# Patient Record
Sex: Female | Born: 1955 | Hispanic: Yes | Marital: Married | Smoking: Never smoker
Health system: Southern US, Community
[De-identification: ages and names within clinical notes are randomized; demographics above are authoritative.]

## PROBLEM LIST (undated history)

## (undated) DIAGNOSIS — I10 Essential (primary) hypertension: Secondary | ICD-10-CM

## (undated) DIAGNOSIS — E119 Type 2 diabetes mellitus without complications: Secondary | ICD-10-CM

## (undated) DIAGNOSIS — K219 Gastro-esophageal reflux disease without esophagitis: Secondary | ICD-10-CM

## (undated) DIAGNOSIS — H269 Unspecified cataract: Secondary | ICD-10-CM

## (undated) HISTORY — PX: INCONTINENCE SURGERY: SHX676

## (undated) HISTORY — DX: Essential (primary) hypertension: I10

## (undated) HISTORY — DX: Gastro-esophageal reflux disease without esophagitis: K21.9

## (undated) HISTORY — DX: Unspecified cataract: H26.9

## (undated) HISTORY — PX: CHOLECYSTECTOMY: SHX55

## (undated) HISTORY — DX: Type 2 diabetes mellitus without complications: E11.9

## (undated) HISTORY — PX: ABDOMINAL HYSTERECTOMY: SHX81

---

## 2015-06-15 HISTORY — PX: REFRACTIVE SURGERY: SHX103

## 2019-01-26 ENCOUNTER — Ambulatory Visit (HOSPITAL_BASED_OUTPATIENT_CLINIC_OR_DEPARTMENT_OTHER): Payer: Self-pay | Admitting: Licensed Clinical Social Worker

## 2019-01-26 ENCOUNTER — Other Ambulatory Visit: Payer: Self-pay

## 2019-01-26 ENCOUNTER — Encounter: Payer: Self-pay | Admitting: Internal Medicine

## 2019-01-26 ENCOUNTER — Ambulatory Visit: Payer: Self-pay | Attending: Internal Medicine | Admitting: Internal Medicine

## 2019-01-26 VITALS — BP 140/69 | HR 84 | Temp 98.3°F | Resp 16 | Wt 198.4 lb

## 2019-01-26 DIAGNOSIS — F4321 Adjustment disorder with depressed mood: Secondary | ICD-10-CM

## 2019-01-26 DIAGNOSIS — Z1331 Encounter for screening for depression: Secondary | ICD-10-CM | POA: Insufficient documentation

## 2019-01-26 DIAGNOSIS — K5903 Drug induced constipation: Secondary | ICD-10-CM | POA: Insufficient documentation

## 2019-01-26 DIAGNOSIS — R413 Other amnesia: Secondary | ICD-10-CM | POA: Insufficient documentation

## 2019-01-26 DIAGNOSIS — E1142 Type 2 diabetes mellitus with diabetic polyneuropathy: Secondary | ICD-10-CM | POA: Insufficient documentation

## 2019-01-26 DIAGNOSIS — E11319 Type 2 diabetes mellitus with unspecified diabetic retinopathy without macular edema: Secondary | ICD-10-CM

## 2019-01-26 DIAGNOSIS — D649 Anemia, unspecified: Secondary | ICD-10-CM | POA: Insufficient documentation

## 2019-01-26 DIAGNOSIS — I10 Essential (primary) hypertension: Secondary | ICD-10-CM

## 2019-01-26 DIAGNOSIS — F331 Major depressive disorder, recurrent, moderate: Secondary | ICD-10-CM

## 2019-01-26 LAB — POCT GLYCOSYLATED HEMOGLOBIN (HGB A1C): HbA1c, POC (controlled diabetic range): 6.8 % (ref 0.0–7.0)

## 2019-01-26 LAB — GLUCOSE, POCT (MANUAL RESULT ENTRY): POC Glucose: 153 mg/dl — AB (ref 70–99)

## 2019-01-26 MED ORDER — INSULIN NPH ISOPHANE & REGULAR (70-30) 100 UNIT/ML ~~LOC~~ SUSP: SUBCUTANEOUS | 6 refills | Status: DC

## 2019-01-26 MED ORDER — GABAPENTIN 400 MG PO CAPS: 400.0000 mg | ORAL_CAPSULE | Freq: Two times a day (BID) | ORAL | 5 refills | Status: DC

## 2019-01-26 MED ORDER — LOSARTAN POTASSIUM 50 MG PO TABS: 50.0000 mg | ORAL_TABLET | Freq: Every day | ORAL | 1 refills | Status: DC

## 2019-01-26 MED ORDER — DOCUSATE SODIUM 100 MG PO CAPS: 100.0000 mg | ORAL_CAPSULE | Freq: Two times a day (BID) | ORAL | 3 refills | Status: DC

## 2019-01-26 NOTE — BH Specialist Note (Signed)
Integrated Behavioral Health Initial Visit  MRN: [id number] Name: Chelsea Coleman  Number of [place] Clinician visits:: 1/6 Session Start time: 9:45 AM  Session End time: 10:05 AM Total time: 20 minutes  Type of Service: [place] Interpretor:Yes.   Interpretor Name and Language: Mardi Mainland [phone])   Warm Hand Off Completed.       SUBJECTIVE: Chelsea Coleman is a 63 y.o. female accompanied by self Patient was referred by Dr. Wynetta Emery for positive depression and anxiety screens. Patient reports the following symptoms/concerns: Pt reports difficulty managing mental health. She is grieving the loss of an adult son from ten years ago Duration of problem: 10 years; Severity of problem: severe  OBJECTIVE: Mood: Anxious and Pleasant and Affect: Appropriate Risk of harm to self or others: No plan to harm self or others Pt scored positive on phq9; however, denies active SI/HI  LIFE CONTEXT: Family and Social: Pt receives support from family who resides locally and partner School/Work: Pt is insured. Has questions regarding social security benefits Self-Care: Pt enjoys spending time with family and watching over the young children in the home Life Changes: Pt reports difficulty managing mental health. She is grieving the loss of an adult son from ten years ago  GOALS ADDRESSED: Patient will: 1. Reduce symptoms of: anxiety and depression 2. Increase knowledge and/or ability of: coping skills and healthy habits  3. Demonstrate ability to: Increase healthy adjustment to current life circumstances  INTERVENTIONS: Interventions utilized: Supportive Counseling and Psychoeducation and/or Health Education  Standardized Assessments completed: GAD-7 and PHQ 2&9 with C-SSRS  ASSESSMENT: Patient currently experiencing depression and anxiety triggered by grief. She reports son was killed in [place] approx ten years ago. Pt scored positive on  phq9; however, denies active SI/HI. Per chart review, pt shared to PCP that she accidentally Coleman SI nearly everyday.    Patient may benefit from psychotherapy. She is not interested in medication management. LCSW discussed stages of grief and provided strategies to promote positive feelings. Pt was appreciative and was successful in identifying healthy coping skills.   PLAN: 1. Follow up with behavioral health clinician on : Schedule follow up appointment if needed 2. Behavioral recommendations: LCSW recommends that pt utilize healthy coping skills discussed 3. Referral(s): [place] (In Clinic) and [employer] Resources:  Legal Aid 4. "From scale of 1-10, how likely are you to follow plan?": 10  Rebekah Chesterfield, LCSW 02/05/2019 4:23 PM

## 2019-01-26 NOTE — Progress Notes (Signed)
Patient ID: Chelsea Coleman, female    DOB: 1955-12-31  MRN: [id number]  CC: New Patient (Initial Visit), Diabetes, and Hypertension   Subjective: Chelsea Coleman is a 63 y.o. female who presents for new pt visit and chronic ds management Her concerns today include:   No previous PCP.  Came from [place] 8 mths ago.  Only she and her spouse are here.  She has daughter in [place].   Pt with hx of DM neuropathy and retinopathy, HTN, obesity, anemia  DIABETES TYPE 2 Last A1C:   BS 153 and A1C 6.8  Med Adherence:  [x]  Yes -she is on Humulin 70/30 20 units in the mornings with breakfast and 10 units in the evenings with dinner.  States that she was on metformin in the past and did not tolerate it.   Medication side effects:  []  Yes    [x]  No Home Monitoring?  [x]  Yes once a wk    []  No Home glucose results range: 115-120 Diet Adherence: [x]  Yes    []  No Exercise: []  Yes    [x]  No - "because I can barely see.  I'm afraid of falling down."  She ambulates with a cane Hypoglycemic episodes?: []  Yes    [x]  No Numbness of the feet? [x]  Yes    []  No Retinopathy hx? [x]  Yes - reports that she was seeing ophthalmologist regularly.  Had laser surgery BL about 3 yrs ago.  Reports the surgery helped at first but vision worse over past yr.  Has Medicare part A, plans to get part B next yr when she is 46 Last eye exam: 3 years ago Comments:   C/O constipation.  "It's hard for me to go." Taking Miralax and fiber which helps. Never had c-scope.  No blood in stools.    HYPERTENSION Currently taking: see medication list - Cozaar 50 mg.  She tells me she is also on a fluid pill but does not recall the name and forgot to bring the bottle with her Med Adherence: [x]  Yes    []  No Medication side effects: []  Yes    [x]  No Adherence with salt restriction: [x]  Yes    []  No Home Monitoring?: []  Yes    [x]  No Monitoring Frequency: []  Yes    []  No Home BP results range: []  Yes    []  No SOB? []  Yes    [x]  No Chest  Pain?: []  Yes    [x]  No Leg swelling?: []  Yes    [x]  No Headaches?: []  Yes    [x]  No Dizziness? []  Yes    [x]  No Comments:   Pos Dep/Anx screen: Both screens were significantly positive.  However, she reports no major issues at this time.  Pt checked yes on screen form for SI.  However, pt states she miss read the form.  She reports significant visual impairment  Some problems with memory. Use to be able to speak english really well but not as much any more.  She learn english when she was young and took her [place] citizen exam exam in [place].  However she has lived in [place] for quite some time and spoke only [place] while there.  Now that she is back in the [place] she is having a difficult time trying to remember some words in [place].  She says she now listens to the kids and pick up from them -Denies any safety issues like forgetting to turn the water or stove off.  She does sometimes forgets where she has placed an object.  Hx of Anemia:  Reports she was getting inj 6 yrs ago about every week.  She does not recall the name of the injection Currently on Iron one daily.    HM: Reports having had Tdap and Pneumonia Vaccines in her country  Family history, social history, surgical history reviewed and updated.    No current outpatient medications on file prior to visit.   No current facility-administered medications on file prior to visit.     No Known Allergies  Social History   Socioeconomic History  . Marital status: Married    Spouse name: Not on file  . Number of children: 1  . Years of education: 12 grade  . Highest education level: Not on file  Occupational History  . Occupation: unemployed  Social Needs  . Financial resource strain: Not on file  . Food insecurity    Worry: Not on file    Inability: Not on file  . Transportation needs    Medical: Not on file    Non-medical: Not on file  Tobacco Use  . Smoking status: Never Smoker  . Smokeless tobacco: Never Used   Substance and Sexual Activity  . Alcohol use: Never    Frequency: Never  . Drug use: Never  . Sexual activity: Not on file  Lifestyle  . Physical activity    Days per week: Not on file    Minutes per session: Not on file  . Stress: Not on file  Relationships  . Social Herbalist on phone: Not on file    Gets together: Not on file    Attends religious service: Not on file    Active member of club or organization: Not on file    Attends meetings of clubs or organizations: Not on file    Relationship status: Not on file  . Intimate partner violence    Fear of current or ex partner: Not on file    Emotionally abused: Not on file    Physically abused: Not on file    Forced sexual activity: Not on file  Other Topics Concern  . Not on file  Social History Narrative  . Not on file    Family History  Problem Relation Age of Onset  . Diabetes Mother     Past Surgical History:  Procedure Laterality Date  . REFRACTIVE SURGERY  2017   BL    ROS: Review of Systems Negative except as stated above  PHYSICAL EXAM: BP 140/69   Pulse 84   Temp 98.3 F (36.8 C) (Oral)   Resp 16   Wt 198 lb 6.4 oz (90 kg)   SpO2 99%   Physical Exam  General appearance - alert, well appearing, obese female and in no distress Mental status - normal mood, behavior, speech, dress, motor activity, and thought processes Eyes - pupils equal and reactive, extraocular eye movements intact Nose - normal and patent, no erythema, discharge or polyps Mouth - mucous membranes moist, pharynx normal without lesions Neck - supple, no significant adenopathy Chest - clear to auscultation, no wheezes, rales or rhonchi, symmetric air entry Heart - normal rate, regular rhythm, normal S1, S2, no murmurs, rubs, clicks or gallops Abdomen - soft, nontender, nondistended, no masses or organomegaly Extremities - peripheral pulses normal, no pedal edema, no clubbing or cyanosis Diabetic Foot Exam - Simple    Simple Foot Form Visual Inspection No deformities, no ulcerations, no  other skin breakdown bilaterally: Yes Sensation Testing Intact to touch and monofilament testing bilaterally: Yes Pulse Check Posterior Tibialis and Dorsalis pulse intact bilaterally: Yes Comments    A1C: 6.8  Depression screen PHQ 2/9 01/26/2019  Decreased Interest 3  Down, Depressed, Hopeless 3  PHQ - 2 Score 6  Altered sleeping 3  Tired, decreased energy 3  Change in appetite 3  Feeling bad or failure about yourself  3  Trouble concentrating 3  Moving slowly or fidgety/restless 3  Suicidal thoughts 3  PHQ-9 Score 27   GAD 7 : Generalized Anxiety Score 01/26/2019  Nervous, Anxious, on Edge 3  Control/stop worrying 3  Worry too much - different things 3  Trouble relaxing 3  Restless 3  Easily annoyed or irritable 3  Afraid - awful might happen 3  Total GAD 7 Score 21      ASSESSMENT AND PLAN: 1. Type 2 diabetes, controlled, with peripheral neuropathy (HCC) -Controlled.  She will continue current dose of Humulin 70/30. Commended her on healthy eating habits and encouraged her to continue doing so. She is not very active because of poor vision.  I told patient that there is no assistance plan to help with getting her to an ophthalmologist.  She may end up having to pay out of pocket.  She prefers to wait until she gets Medicaid part B later this year or next year - POCT glucose (manual entry) - POCT glycosylated hemoglobin (Hb A1C) - Microalbumin / creatinine urine ratio - CBC - Comprehensive metabolic panel - Lipid panel - gabapentin (NEURONTIN) 400 MG capsule; Take 1 capsule (400 mg total) by mouth 2 (two) times daily.  Dispense: 60 capsule; Refill: 5 - insulin NPH-regular Human (70-30) 100 UNIT/ML injection; Inj 20 units in a.m with breakfast and 10 units with dinner  Dispense: 10 mL; Refill: 6  2. Diabetic retinopathy associated with controlled type 2 diabetes mellitus ([place]) See #1 above  3.  Essential hypertension Not at goal but patient has not taken medicine as yet for today.  She tells me that she is also on a fluid pill.  I have asked that she bring that medication with her on the next visit - losartan (COZAAR) 50 MG tablet; Take 1 tablet (50 mg total) by mouth daily.  Dispense: 90 tablet; Refill: 1  4. Anemia, unspecified type It sounds as though she has history of iron deficiency anemia.  Will check iron studies. - Vitamin B12 - Folate - Iron, TIBC and Ferritin Panel  5. Drug-induced constipation Most likely due to the iron supplement that she is taking.  She will continue MiraLAX.  Add Colace.  Advised to eat a green leafy vegetable with at least 1 of her meals every day - docusate sodium (COLACE) 100 MG capsule; Take 1 capsule (100 mg total) by mouth 2 (two) times daily.  Dispense: 60 capsule; Refill: 3  6. Memory difficulties -Patient scored 21 on MMSE.  The only question that she struggled with was the one where she was asked to spell the word world backwards and to count backwards from 100 and blocks of 7.  I have reassured her in regards to her concerns of not remembering much English is not too concerning given the fact that after learning English she went back to [place] where she hardly ever use that language. - TSH - Vitamin B12 - Folate  7. Positive depression screening I think patient missed read the screening questions due to her poor vision.  She denies any issues with depression or anxiety at this time.  Over 30 minutes spent with this patient in direct face-to-face discussion of diagnosis and management and coordinating care. Patient was given the opportunity to ask questions.  Patient verbalized understanding of the plan and was able to repeat key elements of the plan.   Orders Placed This Encounter  Procedures  . Microalbumin / creatinine urine ratio  . CBC  . Comprehensive metabolic panel  . Lipid panel  . TSH  . Vitamin B12  . Folate  .  Iron, TIBC and Ferritin Panel  . POCT glucose (manual entry)  . POCT glycosylated hemoglobin (Hb A1C)     Requested Prescriptions   Signed Prescriptions Disp Refills  . docusate sodium (COLACE) 100 MG capsule 60 capsule 3    Sig: Take 1 capsule (100 mg total) by mouth 2 (two) times daily.  Marland Kitchen losartan (COZAAR) 50 MG tablet 90 tablet 1    Sig: Take 1 tablet (50 mg total) by mouth daily.  Marland Kitchen gabapentin (NEURONTIN) 400 MG capsule 60 capsule 5    Sig: Take 1 capsule (400 mg total) by mouth 2 (two) times daily.  . insulin NPH-regular Human (70-30) 100 UNIT/ML injection 10 mL 6    Sig: Inj 20 units in a.m with breakfast and 10 units with dinner    Return in about 2 months (around 03/28/2019).  Karle Plumber, MD, FACP

## 2019-01-26 NOTE — Patient Instructions (Signed)
La diabetes mellitus y las normas bsicas de atencin mdica Diabetes Mellitus and Standards of Medical Care Tratar la diabetes (diabetes mellitus) puede ser complicado. Su tratamiento de la diabetes puede ser administrado por un equipo de profesionales de la salud, que incluye:  Un mdico especializado en diabetes (endocrinlogo).  Un enfermero especializado o auxiliar mdico.  Enfermeros.  Un especialista en dietas y nutricin (nutricionista registrado).  Un educador certificado para el cuidado de la diabetes.  Un especialista en actividad fsica.  Un farmacutico.  Eliot Ford.  Un especialista en pies (podlogo).  Un dentista.  Un mdico de cabecera.  Un profesional de salud mental. Los mdicos siguen pautas para ayudarlo a Therapist, music la mejor calidad de atencin. El programa siguiente es una gua general para su plan de control de la diabetes. Los mdicos tambin podrn darle instrucciones ms especficas. Exmenes fsicos Tras ser diagnosticado con diabetes mellitus, y cada ao luego de esto, su mdico le preguntar acerca de sus antecedentes mdicos y familiares. Tambin har un examen fsico. Este examen puede incluir:  Medicin de la estatura, peso e ndice de masa corporal [facility]).  Control de la presin arterial. Esto se realiza en cada visita mdica de rutina. La presin arterial deseada puede variar en funcin de sus enfermedades, edad y otros factores.  Examen de la glndula tiroidea.  Examen de la piel.  Examen para deteccin de dao en los nervios (neuropata perifrica). Esto puede incluir controlar el pulso de las piernas y los pies, y el nivel de sensibilidad en las manos y pies.  Un examen de pies completo para inspeccionar la estructura y la piel de los pies, lo que incluye controlar si hay cortes, hematomas, enrojecimiento, ampollas, llagas u otros problemas.  Examen de deteccin de problemas con los vasos sanguneos (vasculares), lo que puede incluir  controlar el pulso de las piernas y los pies y Therapist, sports. Anlisis de [place]. Segn el plan de tratamiento y [place] necesidades personales, es posible que se le realicen las siguientes pruebas:  Anlisis de HbA1c (hemoglobina A1c). Este anlisis proporciona informacin sobre el control de la glucemia (glucosa en la sangre) durante los ltimos 2 o 72mses. Se uCanadapara ajustar el plan de tratamiento, de ser necesario. Este anlisis se har: ? Al menos 2 veces al ao si cumple los objetivos del tratamiento. ? [employer] Corporation si no cumple los objetivos del tratamiento o si sus objetivos del [place]  Anlisis de lpidos, lo que incluye colesterol LDL y HDL y niveles de triglicridos. ? En relacin al LDL, el objetivo es tener menos de 10105mdl (5,5 mmol/l). Si tiene alto riesgo de complicaciones, el objetivo es tener menos de 70 mg/dl (3.9 mmol/l). ? En relacin al HDL, el objetivo es tener 40 mg/dl (2.2 mmol/l) o ms para los hombres y 50 mg/dl (2.8 mmol/l) o ms para las mujeres. Un nivel de colesterol HDL de 60 mg/dl (3.12m74m/l) o superior da una cierta proteccin contra la enfermedad cardaca. ? En relacin a los triglicridos, el objetivo es tener menos de 150 mg/dl (8,3 mmol/l).  Pruebas funcionales hepticas.  Pruebas de la funcin renal.  Pruebas de la funcin tiroidea. Exmenes dentales y ocuThe ServiceMaster Companyisite al denExxon Mobil Corporationces por ao.  Si tiene diabetes tipo1, el mdico puede recomendarle que se haga un examen ocular 3 a 5aos despus del diagnstico y, luego, unaArdelia Memsz al ao despus del priTree surgeon Para los nios que tienen diabetes tipo1, el pedEcologist  examen ocular cuando el nio tiene 10aos o ms y ha tenido diabetes durante 3 a 5 aos. Despus del primer examen, el nio debe hacerse un examen ocular una vez al ao.  Si tiene diabetes tipo2, el mdico puede recomendarle que se haga un examen ocular ni bien haya sido  diagnosticado y, luego, Dollene Cleveland al ao despus del Tree surgeon. Vacunas   Se recomienda aplicar de forma anual la vacuna contra la gripe (influenza) a todas las personas de 27meses en adelante que tengan diabetes.  La vacuna contra la neumona (antineumoccica) est recomendada para todas las personas de 2aos en adelante que tengan diabetes. Si tiene 65aos o ms, puede recibir Engineer, manufacturing como una serie de dos inyecciones [place].  Se recomienda administrar la vacuna contra la hepatitisB en adultos poco despus de que hayan recibido el diagnstico de diabetes.  Los adultos y los nios que tienen diabetes deben recibir todas las vacunas de acuerdo con las recomendaciones especficas segn la edad de los Centros para el Control y la Prevencin de Probation officer for Disease Control and Prevention, CDC). Salud mental y emocional Se recomienda Optometrist controles para Hydrographic surveyor sntomas de trastornos de Youth worker, ansiedad y depresin en el momento del diagnstico, y en una etapa posterior segn sea necesario. Si los controles revelan la presencia de sntomas (resultado positivo de los controles), es posible que deba someterse a ms evaluaciones y Fish farm manager con un profesional de salud mental. Plan de tratamiento Su plan de tratamiento ser revisado en cada visita mdica. Usted y Nature conservation officer lo siguiente:  Cmo est recibiendo los medicamentos, incluso la [place].  Cualquier efectos secundarios que est experimentando.  Sus objetivos deseados con relacin a la glucemia.  La frecuencia de su control de glucemia.  Hbitos del estilo de vida, como nivel de [place] as como consumo de tabaco, alcohol y Engineer, materials. Educacin para el autocontrol de la diabetes El mdico evaluar qu tan bien se encuentran los niveles de glucemia y si est recibiendo la cantidad correcta de [place]. El mdico puede derivarlo a:  Teaching laboratory technician en educacin para la diabetes  certificado para Air cabin crew la diabetes durante toda la vida, comenzando desde el diagnstico.  Un nutricionista matriculado que puede crear o revisar un plan de nutricin personal.  Un especialista en ejercicios que puede analizar el nivel de actividad y un plan de ejercicios. Resumen  Tratar la diabetes (diabetes mellitus) puede ser complicado. Su tratamiento de la diabetes puede ser administrado por un equipo de profesionales de Technical sales engineer.  Los mdicos siguen una gua para ayudarlo a Personal assistant mejor calidad de atencin.  Las normas asistenciales incluyen realizarse, regularmente, exmenes fsicos, anlisis de sangre y controles de la presin arterial, aplicarse vacunas, someterse a estudios de control e informarse sobre cmo controlar su diabetes.  Sus mdicos tambin podrn darle instrucciones ms especficas basndose en su salud. Esta informacin no tiene Marine scientist el consejo del mdico. Asegrese de hacerle al mdico cualquier pregunta que tenga. Document Released: 08/25/2009 Document Revised: 03/28/2017 Document Reviewed: 10/31/2012 Elsevier Patient Education  2020 [employer].

## 2019-01-26 NOTE — Progress Notes (Signed)
[id number] a1c-6.8  Pt states she is needing something for memory

## 2019-01-27 ENCOUNTER — Other Ambulatory Visit: Payer: Self-pay | Admitting: Internal Medicine

## 2019-01-27 DIAGNOSIS — E1169 Type 2 diabetes mellitus with other specified complication: Secondary | ICD-10-CM

## 2019-01-27 LAB — LIPID PANEL
Chol/HDL Ratio: 3.7 ratio (ref 0.0–4.4)
Cholesterol, Total: 211 mg/dL — ABNORMAL HIGH (ref 100–199)
HDL: 57 mg/dL (ref >39)
LDL Calculated: 122 mg/dL — ABNORMAL HIGH (ref 0–99)
Triglycerides: 162 mg/dL — ABNORMAL HIGH (ref 0–149)
VLDL Cholesterol Cal: 32 mg/dL (ref 5–40)

## 2019-01-27 LAB — COMPREHENSIVE METABOLIC PANEL
ALT: 9 IU/L (ref 0–32)
AST: 13 IU/L (ref 0–40)
Albumin/Globulin Ratio: 1.5 (ref 1.2–2.2)
Albumin: 4.1 g/dL (ref 3.8–4.8)
Alkaline Phosphatase: 101 IU/L (ref 39–117)
BUN/Creatinine Ratio: 16 (ref 12–28)
BUN: 20 mg/dL (ref 8–27)
Bilirubin Total: 0.2 mg/dL (ref 0.0–1.2)
CO2: 21 mmol/L (ref 20–29)
Calcium: 9.4 mg/dL (ref 8.7–10.3)
Chloride: 95 mmol/L — ABNORMAL LOW (ref 96–106)
Creatinine, Ser: 1.29 mg/dL — ABNORMAL HIGH (ref 0.57–1.00)
GFR calc Af Amer: 51 mL/min/{1.73_m2} — ABNORMAL LOW (ref >59)
GFR calc non Af Amer: 44 mL/min/{1.73_m2} — ABNORMAL LOW (ref >59)
Globulin, Total: 2.8 g/dL (ref 1.5–4.5)
Glucose: 158 mg/dL — ABNORMAL HIGH (ref 65–99)
Potassium: 5.2 mmol/L (ref 3.5–5.2)
Sodium: 130 mmol/L — ABNORMAL LOW (ref 134–144)
Total Protein: 6.9 g/dL (ref 6.0–8.5)

## 2019-01-27 LAB — CBC
Hematocrit: 31.1 % — ABNORMAL LOW (ref 34.0–46.6)
Hemoglobin: 10.4 g/dL — ABNORMAL LOW (ref 11.1–15.9)
MCH: 30.1 pg (ref 26.6–33.0)
MCHC: 33.4 g/dL (ref 31.5–35.7)
MCV: 90 fL (ref 79–97)
Platelets: 553 10*3/uL — ABNORMAL HIGH (ref 150–450)
RBC: 3.45 x10E6/uL — ABNORMAL LOW (ref 3.77–5.28)
RDW: 13.2 % (ref 11.7–15.4)
WBC: 10 10*3/uL (ref 3.4–10.8)

## 2019-01-27 LAB — TSH: TSH: 1.49 u[IU]/mL (ref 0.450–4.500)

## 2019-01-27 LAB — IRON,TIBC AND FERRITIN PANEL
Ferritin: 573 ng/mL — ABNORMAL HIGH (ref 15–150)
Iron Saturation: 22 % (ref 15–55)
Iron: 50 ug/dL (ref 27–139)
Total Iron Binding Capacity: 228 ug/dL — ABNORMAL LOW (ref 250–450)
UIBC: 178 ug/dL (ref 118–369)

## 2019-01-27 LAB — FOLATE: Folate: 11 ng/mL (ref >3.0)

## 2019-01-27 LAB — MICROALBUMIN / CREATININE URINE RATIO
Creatinine, Urine: 98.4 mg/dL
Microalb/Creat Ratio: 12 mg/g creat (ref 0–29)
Microalbumin, Urine: 11.9 ug/mL

## 2019-01-27 LAB — VITAMIN B12: Vitamin B-12: 614 pg/mL (ref 232–1245)

## 2019-01-27 MED ORDER — ATORVASTATIN CALCIUM 20 MG PO TABS: 20.0000 mg | ORAL_TABLET | Freq: Every day | ORAL | 3 refills | Status: DC

## 2019-01-30 ENCOUNTER — Telehealth: Payer: Self-pay

## 2019-01-30 NOTE — Telephone Encounter (Signed)
[place] interpreters christian  Id# [phone]  contacted pt to go over lab results pt didn't answer was unable to lvm due to vm being full

## 2019-02-09 ENCOUNTER — Telehealth: Payer: Self-pay | Admitting: Licensed Clinical Social Worker

## 2019-02-09 NOTE — Telephone Encounter (Signed)
Completed Legal Aid referral faxed to Abelino Derrick at [phone]

## 2019-03-12 ENCOUNTER — Telehealth: Payer: Self-pay | Admitting: Licensed Clinical Social Worker

## 2019-03-12 NOTE — Telephone Encounter (Signed)
Late Entry 02/28/2019  LCSW followed up with Legal Aid for update on referral. Legal Aid shared they will give pt a call to complete intake.

## 2019-04-06 ENCOUNTER — Encounter: Payer: Self-pay | Admitting: Internal Medicine

## 2019-04-06 ENCOUNTER — Ambulatory Visit (HOSPITAL_BASED_OUTPATIENT_CLINIC_OR_DEPARTMENT_OTHER): Payer: Self-pay | Admitting: Pharmacist

## 2019-04-06 ENCOUNTER — Other Ambulatory Visit: Payer: Self-pay

## 2019-04-06 ENCOUNTER — Ambulatory Visit: Payer: Self-pay | Attending: Internal Medicine | Admitting: Internal Medicine

## 2019-04-06 VITALS — BP 141/80 | HR 75 | Temp 98.2°F | Resp 16 | Wt 202.2 lb

## 2019-04-06 DIAGNOSIS — Z1231 Encounter for screening mammogram for malignant neoplasm of breast: Secondary | ICD-10-CM

## 2019-04-06 DIAGNOSIS — I10 Essential (primary) hypertension: Secondary | ICD-10-CM

## 2019-04-06 DIAGNOSIS — Z23 Encounter for immunization: Secondary | ICD-10-CM

## 2019-04-06 DIAGNOSIS — N1832 Chronic kidney disease, stage 3b: Secondary | ICD-10-CM | POA: Insufficient documentation

## 2019-04-06 DIAGNOSIS — E1169 Type 2 diabetes mellitus with other specified complication: Secondary | ICD-10-CM | POA: Insufficient documentation

## 2019-04-06 DIAGNOSIS — E1142 Type 2 diabetes mellitus with diabetic polyneuropathy: Secondary | ICD-10-CM

## 2019-04-06 DIAGNOSIS — E785 Hyperlipidemia, unspecified: Secondary | ICD-10-CM | POA: Insufficient documentation

## 2019-04-06 DIAGNOSIS — K219 Gastro-esophageal reflux disease without esophagitis: Secondary | ICD-10-CM | POA: Insufficient documentation

## 2019-04-06 LAB — GLUCOSE, POCT (MANUAL RESULT ENTRY): POC Glucose: 120 mg/dl — AB (ref 70–99)

## 2019-04-06 MED ORDER — OMEPRAZOLE 20 MG PO CPDR: 20.0000 mg | DELAYED_RELEASE_CAPSULE | Freq: Every day | ORAL | 3 refills | Status: DC

## 2019-04-06 MED ORDER — ATORVASTATIN CALCIUM 20 MG PO TABS: 20.0000 mg | ORAL_TABLET | Freq: Every day | ORAL | 3 refills | Status: DC

## 2019-04-06 MED ORDER — FUROSEMIDE 20 MG PO TABS: 20.0000 mg | ORAL_TABLET | Freq: Every day | ORAL | 3 refills | Status: DC

## 2019-04-06 NOTE — Progress Notes (Signed)
Patient ID: Chelsea Coleman, female    DOB: 1955-11-04  MRN: [id number]  CC: Diabetes and Hypertension   Subjective: Chelsea Coleman is a 63 y.o. female who presents for 2 mth f/u chronic ds management Her concerns today include:  Pt with hx of DM neuropathy and retinopathy, HTN, obesity, anemia  I went over lab from visit 01/2019.  She has a mild anemia that looked like ACD. Plan to refer her for c-scope once she has Medicare next yr.  Kidney function revealed dec GFR of 44.  Was told in past by previous PCP that she had kidney dysfunction. Na+ was mildly low.  On last visit she told me that she was on a fluid pill but could not recall the name.  She brings in bottles with her today showing that she is on furosemide 20 mg daily and also on omeprazole 40 mg Her LDL cholesterol was not at goal.  I recommend starting atorvastatin.  DM: Reports that she is doing well.  Compliant with taking insulin.  A1c on last visit was 6.8.  She does well with her eating habits.  Checks blood sugars once a week.  Reports that she has never gotten above 150.  No low blood sugar episodes.  She is awaiting Medicare next year to get established with an ophthalmologist  HTN: Reports compliance with Cozaar.  Of note she brings furosemide bottle with her today.  She has been on this from her previous PCP and was still taking.  She limits salt in the foods.  No chest pains or shortness of breath.  No lower extremity edema.  Patient Active Problem List   Diagnosis Date Noted  . Type 2 diabetes, controlled, with peripheral neuropathy ([place]) 01/26/2019  . Diabetic retinopathy associated with controlled type 2 diabetes mellitus ([place]) 01/26/2019  . Essential hypertension 01/26/2019  . Anemia 01/26/2019  . Drug-induced constipation 01/26/2019  . Memory difficulties 01/26/2019  . Positive depression screening 01/26/2019     Current Outpatient Medications on File Prior to Visit  Medication Sig Dispense Refill  .  docusate sodium (COLACE) 100 MG capsule Take 1 capsule (100 mg total) by mouth 2 (two) times daily. 60 capsule 3  . gabapentin (NEURONTIN) 400 MG capsule Take 1 capsule (400 mg total) by mouth 2 (two) times daily. 60 capsule 5  . insulin NPH-regular Human (70-30) 100 UNIT/ML injection Inj 20 units in a.m with breakfast and 10 units with dinner 10 mL 6  . losartan (COZAAR) 50 MG tablet Take 1 tablet (50 mg total) by mouth daily. 90 tablet 1   No current facility-administered medications on file prior to visit.     No Known Allergies  Social History   Socioeconomic History  . Marital status: Married    Spouse name: Not on file  . Number of children: 1  . Years of education: 12 grade  . Highest education level: Not on file  Occupational History  . Occupation: unemployed  Social Needs  . Financial resource strain: Not on file  . Food insecurity    Worry: Not on file    Inability: Not on file  . Transportation needs    Medical: Not on file    Non-medical: Not on file  Tobacco Use  . Smoking status: Never Smoker  . Smokeless tobacco: Never Used  Substance and Sexual Activity  . Alcohol use: Never    Frequency: Never  . Drug use: Never  . Sexual activity: Not on file  Lifestyle  .  Physical activity    Days per week: Not on file    Minutes per session: Not on file  . Stress: Not on file  Relationships  . Social Herbalist on phone: Not on file    Gets together: Not on file    Attends religious service: Not on file    Active member of club or organization: Not on file    Attends meetings of clubs or organizations: Not on file    Relationship status: Not on file  . Intimate partner violence    Fear of current or ex partner: Not on file    Emotionally abused: Not on file    Physically abused: Not on file    Forced sexual activity: Not on file  Other Topics Concern  . Not on file  Social History Narrative  . Not on file    Family History  Problem Relation  Age of Onset  . Diabetes Mother     Past Surgical History:  Procedure Laterality Date  . REFRACTIVE SURGERY  2017   BL    ROS: Review of Systems Negative except as stated above  PHYSICAL EXAM: BP (!) 141/80   Pulse 75   Temp 98.2 F (36.8 C) (Oral)   Resp 16   Wt 202 lb 3.2 oz (91.7 kg)   SpO2 98%   Physical Exam General appearance - alert, well appearing, obese female and in no distress Mental status - normal mood, behavior, speech, dress, motor activity, and thought processes Eyes - pupils equal and reactive, extraocular eye movements intact Nose - normal and patent, no erythema, discharge or polyps Mouth - mucous membranes moist, pharynx normal without lesions Neck - supple, no significant adenopathy Chest - clear to auscultation, no wheezes, rales or rhonchi, symmetric air entry Heart - normal rate, regular rhythm, normal S1, S2, no murmurs, rubs, clicks or gallops Extremities: No lower extremity edema   CMP Latest Ref Rng & Units 01/26/2019  Glucose 65 - 99 mg/dL 158(H)  BUN 8 - 27 mg/dL 20  Creatinine 0.57 - 1.00 mg/dL 1.29(H)  Sodium 134 - 144 mmol/L 130(L)  Potassium 3.5 - 5.2 mmol/L 5.2  Chloride 96 - 106 mmol/L 95(L)  CO2 20 - 29 mmol/L 21  Calcium 8.7 - 10.3 mg/dL 9.4  Total Protein 6.0 - 8.5 g/dL 6.9  Total Bilirubin 0.0 - 1.2 mg/dL 0.2  Alkaline Phos 39 - 117 IU/L 101  AST 0 - 40 IU/L 13  ALT 0 - 32 IU/L 9   Lipid Panel     Component Value Date/Time   CHOL 211 (H) 01/26/2019 1154   TRIG 162 (H) 01/26/2019 1154   HDL 57 01/26/2019 1154   CHOLHDL 3.7 01/26/2019 1154   LDLCALC 122 (H) 01/26/2019 1154    CBC    Component Value Date/Time   WBC 10.0 01/26/2019 1154   RBC 3.45 (L) 01/26/2019 1154   HGB 10.4 (L) 01/26/2019 1154   HCT 31.1 (L) 01/26/2019 1154   PLT 553 (H) 01/26/2019 1154   MCV 90 01/26/2019 1154   MCH 30.1 01/26/2019 1154   MCHC 33.4 01/26/2019 1154   RDW 13.2 01/26/2019 1154    ASSESSMENT AND PLAN: 1. Type 2 diabetes,  controlled, with peripheral neuropathy (HCC) At goal.  Continue current dose of Humulin 70/30 insulin. Continue healthy eating habits - POCT glucose (manual entry)  2. Essential hypertension Not at goal but patient has not taken medicines as yet for the morning.  Continue  Cozaar and furosemide - furosemide (LASIX) 20 MG tablet; Take 1 tablet (20 mg total) by mouth daily.  Dispense: 30 tablet; Refill: 3  3. Gastroesophageal reflux disease without esophagitis Given kidney function is not 100%, I recommend decreasing omeprazole to 20 mg daily - omeprazole (PRILOSEC) 20 MG capsule; Take 1 capsule (20 mg total) by mouth daily.  Dispense: 30 capsule; Refill: 3  4. Need for Tdap vaccination Given  5. Need for influenza vaccination Given  6. Encounter for screening mammogram for malignant neoplasm of breast - MM Digital Screening; Future  7. Hyperlipidemia associated with type 2 diabetes mellitus (HCC) - atorvastatin (LIPITOR) 20 MG tablet; Take 1 tablet (20 mg total) by mouth daily.  Dispense: 90 tablet; Refill: 3  8.  CKD stage IIIb Advised to avoid taking NSAIDs.  Decrease dose of omeprazole from 40 mg daily to 20 mg daily.  Encouraged her really to take it only when needed as it can adversely affect kidney function when taken long-term.   Patient was given the opportunity to ask questions.  Patient verbalized understanding of the plan and was able to repeat key elements of the plan.   Orders Placed This Encounter  Procedures  . MM Digital Screening  . POCT glucose (manual entry)     Requested Prescriptions   Signed Prescriptions Disp Refills  . omeprazole (PRILOSEC) 20 MG capsule 30 capsule 3    Sig: Take 1 capsule (20 mg total) by mouth daily.  . furosemide (LASIX) 20 MG tablet 30 tablet 3    Sig: Take 1 tablet (20 mg total) by mouth daily.  Marland Kitchen atorvastatin (LIPITOR) 20 MG tablet 90 tablet 3    Sig: Take 1 tablet (20 mg total) by mouth daily.    Return in about 4 weeks  (around 05/04/2019) for PAP.  Karle Plumber, MD, FACP

## 2019-04-06 NOTE — Patient Instructions (Signed)
Vacuna Td (contra el ttanos y la difteria): lo que debe saber Td Vaccine (Tetanus and Diphtheria): What You Need to Know 1. Por qu vacunarse? El ttanos y la difteria son enfermedades muy graves. Son Futures trader frecuentes en los [place] actualmente, pero las personas que se infectan suelen tener complicaciones graves. La vacuna Td se [place] para proteger a los adolescentes y a los adultos de ambas enfermedades. Tanto el ttanos como la difteria son infecciones causadas por bacterias. La difteria se transmite de persona a persona a travs de la tos o el estornudo. Las bacterias que causan el ttanos entran en el cuerpo a travs de cortes, raspones o heridas. El TTANOS (trismo) provoca entumecimiento y Writer dolorosa de los msculos, por lo general, en todo el cuerpo.  Puede causar el endurecimiento de los msculos de la cabeza y el cuello, de modo que impide abrir la boca, tragar y, en algunos casos, incluso respirar. El ttanos es causa de muerte en aproximadamente 1de cada 10personas que contraen la infeccin, incluso despus de que reciben la mejor atencin mdica. La DIFTERIA puede hacer que se forme una membrana gruesa en la parte posterior de la garganta.  Puede causar problemas respiratorios, parlisis, insuficiencia cardaca e incluso la muerte. Antes de las vacunas, en los [place] se informaban 200000 casos de difteria y cientos de casos de ttanos cada ao. Desde que comenz la vacunacin, los informes de casos de ambas enfermedades se han reducido en un 99%. 2. Edward Jolly Td La vacuna Td puede proteger a adolescentes y adultos contra el ttanos y la difteria. La vacuna Td habitualmente se aplica como dosis de refuerzo cada 10aos, pero tambin puede administrarse antes si la persona sufre una [place] o herida sucia y grave. A veces, en lugar de la vacuna Td, se recomienda una vacuna llamada Tdap, que protege contra la tos [place], adems de proteger contra el ttanos y la  difteria. El mdico o la persona que le aplique la vacuna puede darle ms informacin al [employer]. La vacuna Td puede administrarse de manera segura simultneamente con otras vacunas. 3. Algunas personas no deben recibir esta vacuna  Una persona que alguna vez ha tenido una reaccin alrgica potencialmente mortal a una dosis anterior de cualquier vacuna contra el ttanos o la difteria, O que tenga una alergia grave a cualquier parte de esta vacuna, no debe recibir la vacuna Td. Informe a la persona que le aplica la vacuna si tiene cualquier alergia grave.  Consulte con su mdico si: ? tuvo hinchazn o dolor intenso despus de recibir cualquier vacuna contra la difteria o el ttanos, ? alguna vez ha sufrido el sndrome de Curator (SGB), ? no se siente Pharmacologist en que se ha programado la vacuna. 4. [place] de [place] reaccin a la vacuna Con cualquier medicamento, incluso las vacunas, existe la posibilidad de que aparezcan efectos secundarios. Suelen ser leves y desaparecen por s solos. Si bien es posible tener [place] graves, estas son Orlene Erm raras. [place] personas a las que se les aplica la vacuna Td no tienen ningn problema. Problemas leves despus de la vacuna Td: (No interfirieron en las actividades)  Management consultant donde se aplic la vacuna (alrededor de 8de cada 10personas)  Enrojecimiento o Estate agent donde se aplic la vacuna (alrededor de 1de cada 4personas)  Cristy Hilts leve (poco frecuente)  Dolor de Pensions consultant (alrededor de 1de cada 4personas)  Cansancio (alrededor de 1de cada 4personas) Problemas moderados despus de la vacuna  Td: (Interfirieron en las [place], pero no exigieron atencin [place])  [place] superior a 102F (39C) (poco frecuente) Problemas graves despus de la vacuna Td: (Impidieron Optometrist las actividades habituales y exigieron atencin mdica)  Clinical cytogeneticist, Social research officer, government intenso, sangrado o enrojecimiento en el brazo en que se  aplic la vacuna (poco frecuente). Problemas que podran ocurrir despus de cualquier vacuna:  A veces, las personas se desmayan despus de un procedimiento mdico, incluida la vacunacin. Permanecer sentado o recostado durante 8minutos puede ayudar a [employer] y las lesiones causadas por las cadas. Informe al mdico si se siente mareado, tiene cambios en la visin o zumbidos en los odos.  Algunas personas sienten un dolor intenso en el hombro y tienen dificultad para mover el brazo donde se aplic la vacuna. Esto sucede con muy poca frecuencia.  Cualquier medicamento puede causar una reaccin alrgica grave. Dichas reacciones son Orlene Erm poco frecuentes con una vacuna (se calcula que menos de 1en un milln de dosis) y se producen entre unos minutos y unas horas despus de la vacunacin. Al igual que con cualquier [employer], existe una probabilidad muy remota de que una vacuna cause una lesin grave o la [place]. La seguridad de las vacunas se controla permanentemente. Para obtener ms informacin, visite: http://www.aguilar.org/ 5. Qu pasa si hay una reaccin grave? A qu signos debo estar atento?  Est atento a la aparicin de signos preocupantes, como los de una reaccin alrgica grave, fiebre muy alta o comportamiento fuera de lo normal. Los signos de una reaccin alrgica grave pueden incluir ronchas, hinchazn de la cara y la garganta, dificultad para respirar, latidos cardacos acelerados, mareos y debilidad. Generalmente, estos comienzan entre unos pocos minutos y algunas horas despus de la vacunacin. Qu debo hacer?  Si usted piensa que se trata de una reaccin alrgica grave o de otra emergencia que no puede esperar, llame al 9-1-1 o dirjase al hospital ms cercano. De lo contrario, llame al mdico.  Despus, la reaccin debe informarse al Sistema de Informe de Eventos Adversos de [place] (Vaccine Adverse Event Reporting System, VAERS). El mdico puede presentar  este informe, o bien puede hacerlo usted mismo a travs del sitio web de VAERS, en www.vaers.SamedayNews.es, o llamando al [phone]. El [place] de Informe de Eventos Adversos de [place] no brinda recomendaciones mdicas. 6. [place] Compensacin de Daos por Salida de Compensacin de Daos por Clinical biochemist (National Vaccine Injury Compensation Program, VICP) es un programa federal que fue creado para Patent examiner a las personas que puedan haber sufrido daos al recibir ciertas vacunas. Aquellas personas que consideren que han sufrido un dao como consecuencia de una vacuna y [place] saber ms acerca del programa y de cmo presentar un reclamo, pueden llamar al [phone] o visitar el sitio web del VICP en GoldCloset.com.ee. Hay un lmite de tiempo para presentar un reclamo de compensacin. 7. Cmo puedo obtener ms informacin?  Consulte a su mdico. Este puede darle el prospecto de la vacuna o recomendarle otras fuentes de informacin.  Comunquese con el servicio de salud de su localidad o su estado.  Comunquese con Garment/textile technologist for [employer] and Prevention, CDC (Centros para el Control y [place]): ? Llame al ([phone] (1-800-CDC-INFO) ? Visite el sitio Biomedical engineer en http://hunter.com/ Declaracin de informacin sobre la vacuna Td (04/17/2016) Esta informacin no tiene Marine scientist el consejo del mdico. Asegrese de hacerle al mdico cualquier pregunta que tenga. Document Released: 09/16/2008 Document Revised: 02/01/2018 Document Reviewed: 02/01/2018  Chartered certified accountant Patient Education  [place] [employer].   Influenza Virus Vaccine injection (Fluarix) Qu es este medicamento? La VACUNA ANTIGRIPAL ayuda a disminuir el riesgo de contraer la influenza, tambin conocida como la gripe. La vacuna solo ayuda a protegerle contra algunas cepas de influenza. Esta vacuna no ayuda a reducir Catering manager de  contraer influenza pandmica H1N1. Este medicamento puede ser utilizado para otros usos; si tiene alguna pregunta consulte con su proveedor de atencin mdica o con su farmacutico. MARCAS COMUNES: Fluarix, Fluzone Qu le debo informar a mi profesional de la salud antes de tomar este medicamento? Necesita saber si usted presenta alguno de los siguientes problemas o situaciones:  trastorno de sangrado como hemofilia  fiebre o infeccin  sndrome de Guillain-Barre u otros problemas neurolgicos  problemas del sistema inmunolgico  infeccin por el virus de la inmunodeficiencia humana (VIH) o SIDA  niveles bajos de plaquetas en la sangre  esclerosis mltiple  una reaccin IT consultant o inusual a las vacunas antigripales, a los huevos, protenas de pollo, al ltex, a la gentamicina, a otros medicamentos, alimentos, colorantes o conservantes  si est embarazada o buscando quedar embarazada  si est amamantando a un beb Cmo debo [employer]? Esta vacuna se administra mediante inyeccin por va intramuscular. Lo administra un profesional de [employer]	[place]. Recibir una copia de informacin escrita sobre la vacuna antes de cada vacuna. Asegrese de leer este folleto cada vez cuidadosamente. Este folleto puede cambiar con frecuencia. Hable con su pediatra para informarse acerca del uso de este medicamento en nios. Puede requerir atencin especial. Sobredosis: Pngase en contacto inmediatamente con un centro toxicolgico o una sala de urgencia si usted cree que haya tomado demasiado medicamento. ATENCIN: [employer] es solo para usted. No comparta este medicamento con nadie. Qu sucede si me olvido de una dosis? No se aplica en este caso. Qu puede interactuar con este medicamento?  quimioterapia o radioterapia  medicamentos que suprimen el sistema inmunolgico, tales como etanercept, anakinra, infliximab y adalimumab  medicamentos que tratan o previenen cogulos  sanguneos, como warfarina  fenitona  medicamentos esteroideos, como la prednisona o la cortisona  teofilina  vacunas Puede ser que esta lista no menciona todas las posibles interacciones. Informe a su profesional de [employer]	[place] de [employer] productos a base de hierbas, medicamentos de [place] o suplementos nutritivos que est tomando. Si usted fuma, consume bebidas alcohlicas o si utiliza drogas ilegales, indqueselo tambin a su profesional de KB Home	[place]. Algunas sustancias pueden interactuar con su medicamento. A qu debo estar atento al usar [employer]? Informe a su mdico o a Barrister's clerk de la [employer] todos los efectos secundarios que persistan despus de 3 das. Llame a su proveedor de atencin mdica si se presentan sntomas inusuales dentro de las 6 semanas posteriores a la vacunacin. Es posible que todava pueda contraer la gripe, pero la enfermedad no ser tan fuerte como normalmente. No puede contraer la gripe de esta vacuna. La vacuna antigripal no le protege contra resfros u otras enfermedades que pueden causar [place]. Debe vacunarse cada ao. Qu efectos secundarios puedo tener al [employer] este medicamento? Efectos secundarios que debe informar a su mdico o a Barrister's clerk de la salud tan pronto como sea posible:  Chief of Staff como erupcin cutnea, picazn o urticarias, hinchazn de la cara, labios o lengua Efectos secundarios que, por lo general, no requieren atencin mdica (debe informarlos a su mdico o a su profesional de la salud si persisten o si son molestos):  fiebre  dolor de Interior and spatial designer y Forensic psychologist, sensibilidad, enrojecimiento o Estate agent de la inyeccin  cansancio o debilidad Puede ser que esta lista no menciona todos los posibles efectos secundarios. Comunquese a su mdico por asesoramiento mdico [employer]. Usted puede informar los efectos secundarios a la FDA por telfono al  1-800-FDA-1088. Dnde debo guardar mi medicina? Esta vacuna se administra solamente en clnicas, farmacias, consultorio mdico u otro consultorio de un profesional de la salud y no Sports coach en su domicilio. ATENCIN: Este folleto es un resumen. Puede ser que no cubra toda la posible informacin. Si usted tiene preguntas acerca de esta medicina, consulte con su mdico, su farmacutico o su profesional de Technical sales engineer.  2020 Elsevier/Gold Standard (2009-12-02 15:31:40)

## 2019-04-06 NOTE — Progress Notes (Signed)
Patient presents for vaccination against influenza and tetanus per orders of Dr. Wynetta Emery. Consent given. Counseling provided. No contraindications exists. Vaccine administered without incident.

## 2019-05-18 ENCOUNTER — Other Ambulatory Visit: Payer: Self-pay

## 2019-05-18 ENCOUNTER — Ambulatory Visit: Payer: Self-pay | Attending: Internal Medicine | Admitting: Internal Medicine

## 2019-05-18 VITALS — BP 131/84 | HR 77 | Temp 98.1°F | Resp 16

## 2019-05-18 DIAGNOSIS — L309 Dermatitis, unspecified: Secondary | ICD-10-CM

## 2019-05-18 DIAGNOSIS — Z124 Encounter for screening for malignant neoplasm of cervix: Secondary | ICD-10-CM

## 2019-05-18 MED ORDER — CLOBETASOL PROPIONATE 0.05 % EX SOLN: CUTANEOUS | 0 refills | Status: DC

## 2019-05-18 NOTE — Progress Notes (Signed)
Patient ID: Chelsea Coleman, female    DOB: 1956/03/12  MRN: [id number]  CC: Gynecologic Exam   Subjective: Chelsea Coleman is a 63 y.o. female who presents for pap Her concerns today include:  Pt with hx of DM neuropathy and retinopathy, HTN, obesity, anemia  Pt is G0P0 No abn pap in past.  Last pap was 3 yrs ago. No vaginal dischg or itching No vaginal bleeding.  She is post menopausal Referred for MMG in 03/2019 but no appt as yet.  Complains of dryness at the nape of the neck of the posterior hairline .she has tried several over-the-counter things including dandruff shampoo without relief.   Patient Active Problem List   Diagnosis Date Noted  . Stage 3b chronic kidney disease 04/06/2019  . Hyperlipidemia associated with type 2 diabetes mellitus ([place]) 04/06/2019  . Gastroesophageal reflux disease without esophagitis 04/06/2019  . Type 2 diabetes, controlled, with peripheral neuropathy ([place]) 01/26/2019  . Diabetic retinopathy associated with controlled type 2 diabetes mellitus ([place]) 01/26/2019  . Essential hypertension 01/26/2019  . Anemia 01/26/2019  . Drug-induced constipation 01/26/2019  . Memory difficulties 01/26/2019  . Positive depression screening 01/26/2019     Current Outpatient Medications on File Prior to Visit  Medication Sig Dispense Refill  . atorvastatin (LIPITOR) 20 MG tablet Take 1 tablet (20 mg total) by mouth daily. 90 tablet 3  . docusate sodium (COLACE) 100 MG capsule Take 1 capsule (100 mg total) by mouth 2 (two) times daily. 60 capsule 3  . furosemide (LASIX) 20 MG tablet Take 1 tablet (20 mg total) by mouth daily. 30 tablet 3  . gabapentin (NEURONTIN) 400 MG capsule Take 1 capsule (400 mg total) by mouth 2 (two) times daily. 60 capsule 5  . insulin NPH-regular Human (70-30) 100 UNIT/ML injection Inj 20 units in a.m with breakfast and 10 units with dinner 10 mL 6  . losartan (COZAAR) 50 MG tablet Take 1 tablet (50 mg total) by mouth daily. 90 tablet 1   . omeprazole (PRILOSEC) 20 MG capsule Take 1 capsule (20 mg total) by mouth daily. 30 capsule 3   No current facility-administered medications on file prior to visit.     No Known Allergies  Social History   Socioeconomic History  . Marital status: Married    Spouse name: Not on file  . Number of children: 1  . Years of education: 12 grade  . Highest education level: Not on file  Occupational History  . Occupation: unemployed  Social Needs  . Financial resource strain: Not on file  . Food insecurity    Worry: Not on file    Inability: Not on file  . Transportation needs    Medical: Not on file    Non-medical: Not on file  Tobacco Use  . Smoking status: Never Smoker  . Smokeless tobacco: Never Used  Substance and Sexual Activity  . Alcohol use: Never    Frequency: Never  . Drug use: Never  . Sexual activity: Not on file  Lifestyle  . Physical activity    Days per week: Not on file    Minutes per session: Not on file  . Stress: Not on file  Relationships  . Social Herbalist on phone: Not on file    Gets together: Not on file    Attends religious service: Not on file    Active member of club or organization: Not on file    Attends meetings of clubs or  organizations: Not on file    Relationship status: Not on file  . Intimate partner violence    Fear of current or ex partner: Not on file    Emotionally abused: Not on file    Physically abused: Not on file    Forced sexual activity: Not on file  Other Topics Concern  . Not on file  Social History Narrative  . Not on file    Family History  Problem Relation Age of Onset  . Diabetes Mother     Past Surgical History:  Procedure Laterality Date  . REFRACTIVE SURGERY  2017   BL    ROS: Review of Systems Negative except as stated above  PHYSICAL EXAM: BP 131/84   Pulse 77   Temp 98.1 F (36.7 C) (Oral)   Resp 16   SpO2 100%   Physical Exam  General appearance - alert, well appearing,  and in no distress Breasts - breasts appear normal, no suspicious masses, no skin or nipple changes or axillary nodes Pelvic - CMA Pollock present:  normal external genitalia, vulva, vagina, cervix, uterus and adnexa.  Surgical os is stenosed. Skin -dry skin with some flaking and erythema at hairline posterior neck more so on the right side   CMP Latest Ref Rng & Units 01/26/2019  Glucose 65 - 99 mg/dL 158(H)  BUN 8 - 27 mg/dL 20  Creatinine 0.57 - 1.00 mg/dL 1.29(H)  Sodium 134 - 144 mmol/L 130(L)  Potassium 3.5 - 5.2 mmol/L 5.2  Chloride 96 - 106 mmol/L 95(L)  CO2 20 - 29 mmol/L 21  Calcium 8.7 - 10.3 mg/dL 9.4  Total Protein 6.0 - 8.5 g/dL 6.9  Total Bilirubin 0.0 - 1.2 mg/dL 0.2  Alkaline Phos 39 - 117 IU/L 101  AST 0 - 40 IU/L 13  ALT 0 - 32 IU/L 9   Lipid Panel     Component Value Date/Time   CHOL 211 (H) 01/26/2019 1154   TRIG 162 (H) 01/26/2019 1154   HDL 57 01/26/2019 1154   CHOLHDL 3.7 01/26/2019 1154   LDLCALC 122 (H) 01/26/2019 1154    CBC    Component Value Date/Time   WBC 10.0 01/26/2019 1154   RBC 3.45 (L) 01/26/2019 1154   HGB 10.4 (L) 01/26/2019 1154   HCT 31.1 (L) 01/26/2019 1154   PLT 553 (H) 01/26/2019 1154   MCV 90 01/26/2019 1154   MCH 30.1 01/26/2019 1154   MCHC 33.4 01/26/2019 1154   RDW 13.2 01/26/2019 1154    ASSESSMENT AND PLAN: 1. Pap smear for cervical cancer screening - Cervicovaginal ancillary only - Cytology - PAP([place])  2. Dermatitis - clobetasol (TEMOVATE) 0.05 % external solution; Apply small amount to affected area 3 times a week PRN  Dispense: 50 mL; Refill: 0   Patient was given the opportunity to ask questions.  Patient verbalized understanding of the plan and was able to repeat key elements of the plan.  [employer] interpreter used during this encounter. #[id number]   No orders of the defined types were placed in this encounter.    Requested Prescriptions   Signed Prescriptions Disp Refills  . clobetasol  (TEMOVATE) 0.05 % external solution 50 mL 0    Sig: Apply small amount to affected area 3 times a week PRN    Return in about 4 months (around 09/16/2019).  Karle Plumber, MD, FACP

## 2019-05-21 LAB — CYTOLOGY - PAP
Comment: NEGATIVE
Diagnosis: NEGATIVE
High risk HPV: NEGATIVE

## 2019-06-05 ENCOUNTER — Other Ambulatory Visit: Payer: Self-pay | Admitting: Internal Medicine

## 2019-06-05 DIAGNOSIS — L309 Dermatitis, unspecified: Secondary | ICD-10-CM

## 2019-07-10 ENCOUNTER — Telehealth: Payer: Self-pay | Admitting: Internal Medicine

## 2019-07-10 DIAGNOSIS — I10 Essential (primary) hypertension: Secondary | ICD-10-CM

## 2019-07-10 MED ORDER — FUROSEMIDE 20 MG PO TABS: 20.0000 mg | ORAL_TABLET | Freq: Every day | ORAL | 2 refills | Status: DC

## 2019-07-10 NOTE — Telephone Encounter (Signed)
Medications were picked up today with the exception of furosemide and clobetasol. She will have to obtain docusate OTC.I have placed refills for the furosemide and clobetasol.

## 2019-07-10 NOTE — Telephone Encounter (Signed)
1) Medication(s) Requested (by name): losartan (COZAAR) 50 MG tablet [[id number]]  atorvastatin (LIPITOR) 20 MG tablet [[id number]]  clobetasol (TEMOVATE) 0.05 % external solution [[id number]]  docusate sodium (COLACE) 100 MG capsule [[id number]] furosemide (LASIX) 20 MG tablet [[id number]]  gabapentin (NEURONTIN) 400 MG capsule [[id number]]  insulin NPH-regular Human (70-30) 100 UNIT/ML injection [[id number]]  omeprazole (PRILOSEC) 20 MG capsule [[id number]]   2) Pharmacy of Choice: [facility], [address]  [place], [place] [address]     Approved medications will be sent to pharmacy, we will reach out to you if there is an issue.  Requests made after 3pm may not be addressed until following business day!

## 2019-07-17 ENCOUNTER — Ambulatory Visit
Admission: RE | Admit: 2019-07-17 | Discharge: 2019-07-17 | Disposition: A | Discharge status: Home / Self Care | Payer: No Typology Code available for payment source | Source: Ambulatory Visit | Attending: Internal Medicine | Admitting: Internal Medicine

## 2019-07-17 ENCOUNTER — Other Ambulatory Visit: Payer: Self-pay

## 2019-07-17 DIAGNOSIS — Z1231 Encounter for screening mammogram for malignant neoplasm of breast: Secondary | ICD-10-CM

## 2019-07-31 ENCOUNTER — Telehealth: Payer: Self-pay

## 2019-07-31 NOTE — Telephone Encounter (Signed)
[place] interpreters  Justice Rocher Id# [phone] contacted pt to go over MM results pt didn't answer left a detailed vm informing pt of results and if she has any questions or concerns to give me a call.

## 2019-08-07 ENCOUNTER — Other Ambulatory Visit: Payer: Self-pay | Admitting: Internal Medicine

## 2019-08-07 ENCOUNTER — Telehealth: Payer: Self-pay | Admitting: Internal Medicine

## 2019-08-07 DIAGNOSIS — K219 Gastro-esophageal reflux disease without esophagitis: Secondary | ICD-10-CM

## 2019-08-07 NOTE — Telephone Encounter (Signed)
1) Medication(s) Requested (by name): omeprazole (PRILOSEC) 20 MG capsule losartan (COZAAR) 50 MG tablet gabapentin (NEURONTIN) 400 MG capsule insulin NPH-regular Human (70-30) 100 UNIT/ML injection   2) Pharmacy of Choice: [facility] pharmacy   3) Special Requests:   Approved medications will be sent to the pharmacy, we will reach out if there is an issue.  Requests made after 3pm may not be addressed until the following business day!  If a patient is unsure of the name of the medication(s) please note and ask patient to call back when they are able to provide all info, do not send to responsible party until all information is available!

## 2019-08-07 NOTE — Telephone Encounter (Signed)
Patient has refills of these medications already being processed in our pharmacy. No further action needed.

## 2019-09-10 ENCOUNTER — Other Ambulatory Visit: Payer: Self-pay | Admitting: Internal Medicine

## 2019-09-10 DIAGNOSIS — E1142 Type 2 diabetes mellitus with diabetic polyneuropathy: Secondary | ICD-10-CM

## 2019-09-10 DIAGNOSIS — I10 Essential (primary) hypertension: Secondary | ICD-10-CM

## 2019-09-27 ENCOUNTER — Encounter: Payer: Self-pay | Admitting: Internal Medicine

## 2019-09-27 ENCOUNTER — Ambulatory Visit: Payer: Self-pay | Attending: Internal Medicine | Admitting: Internal Medicine

## 2019-09-27 ENCOUNTER — Other Ambulatory Visit: Payer: Self-pay

## 2019-09-27 DIAGNOSIS — R252 Cramp and spasm: Secondary | ICD-10-CM

## 2019-09-27 DIAGNOSIS — R1013 Epigastric pain: Secondary | ICD-10-CM

## 2019-09-27 DIAGNOSIS — E785 Hyperlipidemia, unspecified: Secondary | ICD-10-CM

## 2019-09-27 DIAGNOSIS — I10 Essential (primary) hypertension: Secondary | ICD-10-CM

## 2019-09-27 DIAGNOSIS — E1169 Type 2 diabetes mellitus with other specified complication: Secondary | ICD-10-CM

## 2019-09-27 DIAGNOSIS — E1142 Type 2 diabetes mellitus with diabetic polyneuropathy: Secondary | ICD-10-CM

## 2019-09-27 NOTE — Progress Notes (Signed)
Virtual Visit via Telephone Note Due to current restrictions/limitations of in-office visits due to the COVID-19 pandemic, this scheduled clinical appointment was converted to a telehealth visit  I connected with Nena Polio on 09/27/19 at 10 a.m by telephone and verified that I am speaking with the correct person using two identifiers. I am in my office.  The patient is at home.  Only the patient, myself and Barnetta Chapel [phone]) from [employer] participated in this encounter.  I discussed the limitations, risks, security and privacy concerns of performing an evaluation and management service by telephone and the availability of in person appointments. I also discussed with the patient that there may be a patient responsible charge related to this service. The patient expressed understanding and agreed to proceed.   History of Present Illness: Pt with hx of DM neuropathy and retinopathy, HTN, HL, obesity, anemia  C/o "pain in the pit of stomach since yesterday but I have been drinking some pills and it has been calming it."  When asked which pills, she listed all of her current meds. -pain is worse when she eats.  no N/V.  Reports she usually has cramps because she has problems with her colon.  Cramps have not been any worse since pain started yesterday.  No diarrhea.  Last BM was this morning. No blood in stools or black stools. Past surgeries - had hysterectomy. -currently rates pain 6-7/10.  Was not taking Omeprazole every day.  Started taking it again 3 days ago.   DIABETES TYPE 2  Lab Results  Component Value Date   HGBA1C 6.8 01/26/2019   Med Adherence:  [x]  Yes -on NPH/Reg 70/30 20 units a.m/10 units p.m    []  No Medication side effects:  []  Yes    [x]  No Home Monitoring?  [x]  Yes but only occasionally   []  No Home glucose results range: Diet Adherence: [x]  Yes    []  No Exercise: [x]  Yes -walks daily   Hypoglycemic episodes?: []  Yes    [x]  No Numbness of the  feet? []  Yes    [x]  No Retinopathy hx? [x]  Yes    []  No Last eye exam:  05/2019 Comments: gets cramps in legs at night x 2 wks.  Started on Lipitor 03/2019  HTN:  Reports compliance with Losartan and Furosemide She does have home BP device but reports it has been giving her issues.  Checks BP about every 2 wks.   Limits salt in foods  She reports that she is getting some bills coming to her house and she wants the bills to stop.  When asked where the bills are coming from, patient states that she does not know.  I asked whether she has older children living with her who can take a look at them and help her figure it out.  She states that she does not.   Current Outpatient Medications  Medication Instructions  . atorvastatin (LIPITOR) 20 mg, Oral, Daily  . clobetasol (TEMOVATE) 0.05 % external solution APPLY SMALL AMOUNT TO AFFECTED AREA 3 TIMES A WEEK AS NEEDED.  Marland Kitchen docusate sodium (COLACE) 100 mg, Oral, 2 times daily  . furosemide (LASIX) 20 mg, Oral, Daily  . gabapentin (NEURONTIN) 400 mg, Oral, 2 times daily  . insulin NPH-regular Human (70-30) 100 UNIT/ML injection Inj 20 units in a.m with breakfast and 10 units with dinner  . losartan (COZAAR) 50 mg, Oral, Daily  . omeprazole (PRILOSEC) 20 mg, Oral, Daily    Observations/Objective: Lab Results  Component Value Date   WBC 10.0 01/26/2019   HGB 10.4 (L) 01/26/2019   HCT 31.1 (L) 01/26/2019   MCV 90 01/26/2019   PLT 553 (H) 01/26/2019     Chemistry      Component Value Date/Time   NA 130 (L) 01/26/2019 1154   K 5.2 01/26/2019 1154   CL 95 (L) 01/26/2019 1154   CO2 21 01/26/2019 1154   BUN 20 01/26/2019 1154   CREATININE 1.29 (H) 01/26/2019 1154      Component Value Date/Time   CALCIUM 9.4 01/26/2019 1154   ALKPHOS 101 01/26/2019 1154   AST 13 01/26/2019 1154   ALT 9 01/26/2019 1154   BILITOT 0.2 01/26/2019 1154     Lab Results  Component Value Date   CHOL 211 (H) 01/26/2019   HDL 57 01/26/2019   LDLCALC 122 (H)  01/26/2019   TRIG 162 (H) 01/26/2019   CHOLHDL 3.7 01/26/2019     Assessment and Plan: 1. Type 2 diabetes, controlled, with peripheral neuropathy (HCC) Level of control unknown as patient does not check blood sugars consistently.  She is agreeable to coming to the lab to have blood test done including an A1c. She will continue 70/30 insulin - CBC; Future - Comprehensive metabolic panel; Future - Hemoglobin A1c; Future - Microalbumin / creatinine urine ratio; Future  2. Essential hypertension Level of control unknown.  But reading on last visit was not bad.  She will continue Cozaar and furosemide  3. Hyperlipidemia associated with type 2 diabetes mellitus ([place]) I think the Lipitor may be causing cramps.  I recommend dec 20 mg to half a tablet daily to see if the cramps decreases - Lipid panel; Future  4. Cramp of lower extremity associated with rest See #3 above  5. Epigastric abdominal pain Advised patient that if pain gets worse she should be seen in the emergency room.  Given that she is having some pain with meals, I will check an ultrasound of the right upper quadrant for gallstones.  Patient states she has transportation issues and will not be able to have this done today but will be able to have it done next Monday. - US Abdomen Limited RUQ; Future   Follow Up Instructions: 2 weeks or sooner for recheck of abdominal pain   I discussed the assessment and treatment plan with the patient. The patient was provided an opportunity to ask questions and all were answered. The patient agreed with the plan and demonstrated an understanding of the instructions.   The patient was advised to call back or seek an in-person evaluation if the symptoms worsen or if the condition fails to improve as anticipated.  I provided 33 minutes of non-face-to-face time during this encounter.   Karle Plumber, MD

## 2019-10-01 ENCOUNTER — Ambulatory Visit: Payer: Self-pay | Attending: Internal Medicine

## 2019-10-01 ENCOUNTER — Other Ambulatory Visit: Payer: Self-pay | Admitting: Internal Medicine

## 2019-10-01 ENCOUNTER — Other Ambulatory Visit: Payer: Self-pay

## 2019-10-01 ENCOUNTER — Telehealth: Payer: Self-pay | Admitting: Internal Medicine

## 2019-10-01 DIAGNOSIS — E1142 Type 2 diabetes mellitus with diabetic polyneuropathy: Secondary | ICD-10-CM

## 2019-10-01 DIAGNOSIS — E785 Hyperlipidemia, unspecified: Secondary | ICD-10-CM

## 2019-10-01 DIAGNOSIS — E1169 Type 2 diabetes mellitus with other specified complication: Secondary | ICD-10-CM

## 2019-10-01 MED ORDER — INSULIN NPH ISOPHANE & REGULAR (70-30) 100 UNIT/ML ~~LOC~~ SUSP: SUBCUTANEOUS | 6 refills | Status: DC

## 2019-10-01 NOTE — Telephone Encounter (Signed)
These are already available in our pharmacy with refills (with the exception of insulin). I have sent a rx for insulin. I have placed refills for the other medications in our pharmacy.

## 2019-10-01 NOTE — Telephone Encounter (Signed)
1) Medication(s) Requested (by name):insulin NPH-regular Human (70-30) 100 UNIT/ML injection [[id number]] losartan (COZAAR) 50 MG tablet [[id number]]  furosemide (LASIX) 20 MG tablet [[id number] clobetasol (TEMOVATE) 0.05 % external solution [[id number]]  atorvastatin (LIPITOR) 20 MG tablet [[id number] docusate sodium (COLACE) 100 MG capsule    2) [facility] [place], [place]   3) Special Requests:   Approved medications will be sent to the pharmacy, we will reach out if there is an issue.  Requests made after 3pm may not be addressed until the following business day!  If a patient is unsure of the name of the medication(s) please note and ask patient to call back when they are able to provide all info, do not send to responsible party until all information is available!

## 2019-10-02 LAB — CBC
Hematocrit: 33.1 % — ABNORMAL LOW (ref 34.0–46.6)
Hemoglobin: 11.1 g/dL (ref 11.1–15.9)
MCH: 30.8 pg (ref 26.6–33.0)
MCHC: 33.5 g/dL (ref 31.5–35.7)
MCV: 92 fL (ref 79–97)
Platelets: 487 10*3/uL — ABNORMAL HIGH (ref 150–450)
RBC: 3.6 x10E6/uL — ABNORMAL LOW (ref 3.77–5.28)
RDW: 12.9 % (ref 11.7–15.4)
WBC: 9.6 10*3/uL (ref 3.4–10.8)

## 2019-10-02 LAB — COMPREHENSIVE METABOLIC PANEL
ALT: 8 IU/L (ref 0–32)
AST: 14 IU/L (ref 0–40)
Albumin/Globulin Ratio: 1.2 (ref 1.2–2.2)
Albumin: 3.8 g/dL (ref 3.8–4.8)
Alkaline Phosphatase: 124 IU/L — ABNORMAL HIGH (ref 39–117)
BUN/Creatinine Ratio: 18 (ref 12–28)
BUN: 28 mg/dL — ABNORMAL HIGH (ref 8–27)
Bilirubin Total: 0.2 mg/dL (ref 0.0–1.2)
CO2: 19 mmol/L — ABNORMAL LOW (ref 20–29)
Calcium: 9.1 mg/dL (ref 8.7–10.3)
Chloride: 99 mmol/L (ref 96–106)
Creatinine, Ser: 1.57 mg/dL — ABNORMAL HIGH (ref 0.57–1.00)
GFR calc Af Amer: 40 mL/min/{1.73_m2} — ABNORMAL LOW (ref >59)
GFR calc non Af Amer: 35 mL/min/{1.73_m2} — ABNORMAL LOW (ref >59)
Globulin, Total: 3.1 g/dL (ref 1.5–4.5)
Glucose: 140 mg/dL — ABNORMAL HIGH (ref 65–99)
Potassium: 5.4 mmol/L — ABNORMAL HIGH (ref 3.5–5.2)
Sodium: 133 mmol/L — ABNORMAL LOW (ref 134–144)
Total Protein: 6.9 g/dL (ref 6.0–8.5)

## 2019-10-02 LAB — LIPID PANEL
Chol/HDL Ratio: 2.8 ratio (ref 0.0–4.4)
Cholesterol, Total: 150 mg/dL (ref 100–199)
HDL: 53 mg/dL (ref >39)
LDL Chol Calc (NIH): 68 mg/dL (ref 0–99)
Triglycerides: 171 mg/dL — ABNORMAL HIGH (ref 0–149)
VLDL Cholesterol Cal: 29 mg/dL (ref 5–40)

## 2019-10-02 LAB — HEMOGLOBIN A1C
Est. average glucose Bld gHb Est-mCnc: 180 mg/dL
Hgb A1c MFr Bld: 7.9 % — ABNORMAL HIGH (ref 4.8–5.6)

## 2019-10-02 LAB — MICROALBUMIN / CREATININE URINE RATIO
Creatinine, Urine: 84.5 mg/dL
Microalb/Creat Ratio: 10 mg/g creat (ref 0–29)
Microalbumin, Urine: 8.6 ug/mL

## 2019-10-03 ENCOUNTER — Other Ambulatory Visit: Payer: Self-pay | Admitting: Internal Medicine

## 2019-10-03 ENCOUNTER — Telehealth: Payer: Self-pay

## 2019-10-03 MED ORDER — AMLODIPINE BESYLATE 5 MG PO TABS: 5.0000 mg | ORAL_TABLET | Freq: Every day | ORAL | 3 refills | Status: DC

## 2019-10-03 NOTE — Progress Notes (Signed)
Let patient know that her cholesterol level is good.  Urine revealed no significant amount of protein in the urine which is good.  Her A1c is 7.9 with goal being less than 7.  This means her diabetes is not well controlled.  Please ask her to check her blood sugar every morning and keep a log of the results and bring them in on her next visit with me.  Your kidney function is not 100% and has worsened since it was last checked 8 months ago.  Make sure that you are drinking adequate fluids during the day.  Potassium level is elevated.  Try to cut back on potassium rich foods like bananas, oranges and orange juice.  I also recommend stopping the medication called Cozaar (Losartan) because of the high potassium.  Instead I will place her on a new blood pressure medication called amlodipine.  I have sent that prescription to her pharmacy for pickup.  She still has mild anemia but it has improved from 8 months ago.  I had ordered a gallbladder ultrasound but looks like it has not been scheduled.  Please schedule for her.

## 2019-10-03 NOTE — Telephone Encounter (Signed)
Raymond Id# [id number]  contacted pt to go over lab results and appointment pt didn't answer lvm asking pt to give a call back at her earliest convenience    Pt is schedule for [place] on April 27th at 1030am at [facility]. Pt will need to arrive by 1015am. Pt will need to be NPO after midnight

## 2019-10-09 ENCOUNTER — Ambulatory Visit (HOSPITAL_COMMUNITY): Payer: Self-pay

## 2019-10-22 ENCOUNTER — Ambulatory Visit: Payer: No Typology Code available for payment source | Admitting: Internal Medicine

## 2019-11-05 ENCOUNTER — Other Ambulatory Visit: Payer: Self-pay | Admitting: Internal Medicine

## 2019-11-05 DIAGNOSIS — I10 Essential (primary) hypertension: Secondary | ICD-10-CM

## 2019-11-26 ENCOUNTER — Ambulatory Visit: Payer: No Typology Code available for payment source | Admitting: Internal Medicine

## 2019-12-24 ENCOUNTER — Ambulatory Visit: Payer: Medicare Other | Attending: Internal Medicine | Admitting: Internal Medicine

## 2019-12-24 ENCOUNTER — Encounter: Payer: Self-pay | Admitting: Internal Medicine

## 2019-12-24 ENCOUNTER — Other Ambulatory Visit: Payer: Self-pay

## 2019-12-24 VITALS — BP 138/74 | HR 82 | Temp 98.6°F | Resp 16 | Ht 62.0 in | Wt 206.4 lb

## 2019-12-24 DIAGNOSIS — Z1211 Encounter for screening for malignant neoplasm of colon: Secondary | ICD-10-CM | POA: Diagnosis not present

## 2019-12-24 DIAGNOSIS — Z79899 Other long term (current) drug therapy: Secondary | ICD-10-CM | POA: Insufficient documentation

## 2019-12-24 DIAGNOSIS — E669 Obesity, unspecified: Secondary | ICD-10-CM | POA: Insufficient documentation

## 2019-12-24 DIAGNOSIS — E785 Hyperlipidemia, unspecified: Secondary | ICD-10-CM | POA: Diagnosis not present

## 2019-12-24 DIAGNOSIS — E11319 Type 2 diabetes mellitus with unspecified diabetic retinopathy without macular edema: Secondary | ICD-10-CM | POA: Diagnosis not present

## 2019-12-24 DIAGNOSIS — R1013 Epigastric pain: Secondary | ICD-10-CM | POA: Diagnosis not present

## 2019-12-24 DIAGNOSIS — E875 Hyperkalemia: Secondary | ICD-10-CM

## 2019-12-24 DIAGNOSIS — E1142 Type 2 diabetes mellitus with diabetic polyneuropathy: Secondary | ICD-10-CM | POA: Diagnosis not present

## 2019-12-24 DIAGNOSIS — E1122 Type 2 diabetes mellitus with diabetic chronic kidney disease: Secondary | ICD-10-CM | POA: Insufficient documentation

## 2019-12-24 DIAGNOSIS — Z7901 Long term (current) use of anticoagulants: Secondary | ICD-10-CM | POA: Insufficient documentation

## 2019-12-24 DIAGNOSIS — I129 Hypertensive chronic kidney disease with stage 1 through stage 4 chronic kidney disease, or unspecified chronic kidney disease: Secondary | ICD-10-CM | POA: Diagnosis not present

## 2019-12-24 DIAGNOSIS — R109 Unspecified abdominal pain: Secondary | ICD-10-CM | POA: Insufficient documentation

## 2019-12-24 DIAGNOSIS — E1169 Type 2 diabetes mellitus with other specified complication: Secondary | ICD-10-CM | POA: Diagnosis not present

## 2019-12-24 DIAGNOSIS — Z23 Encounter for immunization: Secondary | ICD-10-CM | POA: Diagnosis not present

## 2019-12-24 DIAGNOSIS — K219 Gastro-esophageal reflux disease without esophagitis: Secondary | ICD-10-CM | POA: Insufficient documentation

## 2019-12-24 DIAGNOSIS — N1832 Chronic kidney disease, stage 3b: Secondary | ICD-10-CM | POA: Diagnosis not present

## 2019-12-24 DIAGNOSIS — I10 Essential (primary) hypertension: Secondary | ICD-10-CM | POA: Diagnosis not present

## 2019-12-24 DIAGNOSIS — Z794 Long term (current) use of insulin: Secondary | ICD-10-CM | POA: Insufficient documentation

## 2019-12-24 MED ORDER — TRUE METRIX BLOOD GLUCOSE TEST VI STRP: ORAL_STRIP | 12 refills | Status: DC

## 2019-12-24 MED ORDER — AMLODIPINE BESYLATE 5 MG PO TABS: 5.0000 mg | ORAL_TABLET | Freq: Every day | ORAL | 3 refills | Status: DC

## 2019-12-24 MED ORDER — TRUEPLUS LANCETS 28G MISC: 4 refills | Status: DC

## 2019-12-24 MED ORDER — TRUE METRIX METER W/DEVICE KIT: PACK | 0 refills | Status: DC

## 2019-12-24 NOTE — Progress Notes (Signed)
Patient ID: Chelsea Coleman, female    DOB: May 13, 1956  MRN: [id number]  CC: Abdominal Pain   Subjective: Chelsea Coleman is a 64 y.o. female who presents for chronic ds management. Her concerns today include:  Pt with hx of DM neuropathy and retinopathy, HTN, HL, obesity, anemia of chronic ds, thrombocytosis, CKD 3b  On last tele-visit pt was having abdominal pain. This has since resolved.  Due for c-scope for colon cancer screen.  She now has medicare and is agreeable to being referred  HTN:  No device to check BP. Does not have meds with her. K+ level was elevated.  Plan was to have her stop the Cozaar and change to Norvasc instead.  However my CMA was unable to reach her.  Still taking Losartan and never got the amlodipine.  HL:  She was having leg cramps at nights.  I recommend that she dec the Lipitor to 20 mg 1/2 tab daily.  Pt reports cramps have been less frequent with the decreased dose.  DM:  Machine no longer works.  Would like new meter and testing supplies Gained 8 lbs since 01/2019.  She feels it is the insulin.  Reports eating small portions.  Loves salads and yogart.   Request to see a retinal specialist  [place] with 25% vision.  Would like to see a retinal specialist here.  Positive depression screen 01/2019 but negative today.  Declined screen for hepatitis C and HIV.  Patient Active Problem List   Diagnosis Date Noted  . Stage 3b chronic kidney disease 04/06/2019  . Hyperlipidemia associated with type 2 diabetes mellitus ([place]) 04/06/2019  . Gastroesophageal reflux disease without esophagitis 04/06/2019  . Type 2 diabetes, controlled, with peripheral neuropathy ([place]) 01/26/2019  . Diabetic retinopathy associated with controlled type 2 diabetes mellitus ([place]) 01/26/2019  . Essential hypertension 01/26/2019  . Anemia 01/26/2019  . Drug-induced constipation 01/26/2019  . Memory difficulties  01/26/2019  . Positive depression screening 01/26/2019     Current Outpatient Medications on File Prior to Visit  Medication Sig Dispense Refill  . amLODipine (NORVASC) 5 MG tablet Take 1 tablet (5 mg total) by mouth daily. Stop Losartan 90 tablet 3  . atorvastatin (LIPITOR) 20 MG tablet Take 1 tablet (20 mg total) by mouth daily. 90 tablet 3  . clobetasol (TEMOVATE) 0.05 % external solution APPLY SMALL AMOUNT TO AFFECTED AREA 3 TIMES A WEEK AS NEEDED. 50 mL 0  . docusate sodium (COLACE) 100 MG capsule Take 1 capsule (100 mg total) by mouth 2 (two) times daily. 60 capsule 3  . furosemide (LASIX) 20 MG tablet TAKE 1 TABLET (20 MG TOTAL) BY MOUTH DAILY. 30 tablet 2  . gabapentin (NEURONTIN) 400 MG capsule TAKE 1 CAPSULE (400 MG TOTAL) BY MOUTH 2 (TWO) TIMES DAILY. 60 capsule 5  . insulin NPH-regular Human (70-30) 100 UNIT/ML injection Inj 20 units in a.m with breakfast and 10 units with dinner 10 mL 6  . omeprazole (PRILOSEC) 20 MG capsule TAKE 1 CAPSULE (20 MG TOTAL) BY MOUTH DAILY. 30 capsule 3   No current facility-administered medications on file prior to visit.    No Known Allergies  Social History   Socioeconomic History  . Marital status: Married    Spouse name: Not on file  . Number of children: 1  . Years of education: 12 grade  . Highest education level: Not on file  Occupational History  .  Occupation: unemployed  Tobacco Use  . Smoking status: Never Smoker  . Smokeless tobacco: Never Used  Vaping Use  . Vaping Use: Never used  Substance and Sexual Activity  . Alcohol use: Never  . Drug use: Never  . Sexual activity: Not on file  Other Topics Concern  . Not on file  Social History Narrative  . Not on file   Social Determinants of Health   Financial Resource Strain:   . Difficulty of Paying Living Expenses:   Food Insecurity:   . Worried About Charity fundraiser in the Last Year:   . Arboriculturist in the Last Year:   Transportation Needs:   . Lexicographer (Medical):   Marland Kitchen Lack of Transportation (Non-Medical):   Physical Activity:   . Days of Exercise per Week:   . Minutes of Exercise per Session:   Stress:   . Feeling of Stress :   Social Connections:   . Frequency of Communication with Friends and Family:   . Frequency of Social Gatherings with Friends and Family:   . Attends Religious Services:   . Active Member of Clubs or Organizations:   . Attends Archivist Meetings:   Marland Kitchen Marital Status:   Intimate Partner Violence:   . Fear of Current or Ex-Partner:   . Emotionally Abused:   Marland Kitchen Physically Abused:   . Sexually Abused:     Family History  Problem Relation Age of Onset  . Diabetes Mother     Past Surgical History:  Procedure Laterality Date  . ABDOMINAL HYSTERECTOMY     fibroid  . REFRACTIVE SURGERY  2017   BL    ROS: Review of Systems Negative except as stated above  PHYSICAL EXAM: BP 138/74   Pulse 82   Temp 98.6 F (37 C)   Resp 16   Wt 206 lb 6.4 oz (93.6 kg)   SpO2 98%   Wt Readings from Last 3 Encounters:  12/24/19 206 lb 6.4 oz (93.6 kg)  04/06/19 202 lb 3.2 oz (91.7 kg)  01/26/19 198 lb 6.4 oz (90 kg)   Repeat BP 140/80 Physical Exam  General appearance - alert, well appearing, and in no distress Mental status - normal mood, behavior, speech, dress, motor activity, and thought processes Mouth - mucous membranes moist, pharynx normal without lesions Neck - supple, no significant adenopathy Chest - clear to auscultation, no wheezes, rales or rhonchi, symmetric air entry Heart - normal rate, regular rhythm, normal S1, S2, no murmurs, rubs, clicks or gallops Extremities - peripheral pulses normal, no pedal edema, no clubbing or cyanosis  Depression screen [facility] 2/9 12/24/2019 09/27/2019 01/26/2019  Decreased Interest 0 0 3  Down, Depressed, Hopeless 0 0 3  PHQ - 2 Score 0 0 6  Altered sleeping - - 3  Tired, decreased energy - - 3  Change in appetite - - 3  Feeling bad or  failure about yourself  - - 3  Trouble concentrating - - 3  Moving slowly or fidgety/restless - - 3  Suicidal thoughts - - 3  PHQ-9 Score - - 27    CMP Latest Ref Rng & Units 10/01/2019 01/26/2019  Glucose 65 - 99 mg/dL 140(H) 158(H)  BUN 8 - 27 mg/dL 28(H) 20  Creatinine 0.57 - 1.00 mg/dL 1.57(H) 1.29(H)  Sodium 134 - 144 mmol/L 133(L) 130(L)  Potassium 3.5 - 5.2 mmol/L 5.4(H) 5.2  Chloride 96 - 106 mmol/L 99 95(L)  CO2 20 -  29 mmol/L 19(L) 21  Calcium 8.7 - 10.3 mg/dL 9.1 9.4  Total Protein 6.0 - 8.5 g/dL 6.9 6.9  Total Bilirubin 0.0 - 1.2 mg/dL 0.2 0.2  Alkaline Phos 39 - 117 IU/L 124(H) 101  AST 0 - 40 IU/L 14 13  ALT 0 - 32 IU/L 8 9   Lipid Panel     Component Value Date/Time   CHOL 150 10/01/2019 0926   TRIG 171 (H) 10/01/2019 0926   HDL 53 10/01/2019 0926   CHOLHDL 2.8 10/01/2019 0926   LDLCALC 68 10/01/2019 0926    CBC    Component Value Date/Time   WBC 9.6 10/01/2019 0926   RBC 3.60 (L) 10/01/2019 0926   HGB 11.1 10/01/2019 0926   HCT 33.1 (L) 10/01/2019 0926   PLT 487 (H) 10/01/2019 0926   MCV 92 10/01/2019 0926   MCH 30.8 10/01/2019 0926   MCHC 33.5 10/01/2019 0926   RDW 12.9 10/01/2019 0926   Lab Results  Component Value Date   HGBA1C 7.9 (H) 10/01/2019    ASSESSMENT AND PLAN: 1. Epigastric abdominal pain -resolved  2. Type 2 diabetes, controlled, with peripheral neuropathy (HCC) Last A1c 7.9.  Level of control at this time unknown as she has not had device to check blood sugar.  New diabetic testing supplies sent to her pharmacy.  Advised that she check blood sugar at least twice a day before meals and bring in the readings to see the clinical pharmacist in 2 to 4 weeks.  In the meantime, she will continue taking NPH/regular insulin 70/30 20 units in the morning and 10 units with dinner. - Ambulatory referral to Ophthalmology - glucose blood (TRUE METRIX BLOOD GLUCOSE TEST) test strip; Use as instructed  Dispense: 100 each; Refill: 12 - Blood  Glucose Monitoring Suppl (TRUE METRIX METER) w/Device KIT; Use as directed  Dispense: 1 kit; Refill: 0 - TRUEplus Lancets 28G MISC; Use as directed  Dispense: 100 each; Refill: 4  3. Essential hypertension Not at goal.  Add amlodipine 5 mg.  We will check BMP today to make sure her potassium level is okay on the Cozaar. - Basic Metabolic Panel - amLODipine (NORVASC) 5 MG tablet; Take 1 tablet (5 mg total) by mouth daily. Stop Losartan  Dispense: 90 tablet; Refill: 3  4. Hyperlipidemia associated with type 2 diabetes mellitus (HCC) Continue atorvastatin  5. Stage 3b chronic kidney disease Recheck BMP today.  Avoid nephrotoxic medications  6. Hyperkalemia - Basic Metabolic Panel  7. Screening for colon cancer - Ambulatory referral to Gastroenterology  8. Need for vaccination against Streptococcus pneumoniae using pneumococcal conjugate vaccine 13 Given   Patient was given the opportunity to ask questions.  Patient verbalized understanding of the plan and was able to repeat key elements of the plan.  [employer] interpreter used during this encounter. #[id number]  No orders of the defined types were placed in this encounter.    Requested Prescriptions    No prescriptions requested or ordered in this encounter    No follow-ups on file.  Karle Plumber, MD, FACP

## 2019-12-24 NOTE — Patient Instructions (Addendum)
Your blood pressure is not at goal.  We have added a new blood pressure medication called amlodipine 5 mg daily.    I have submitted the referral for you to see the ophthalmologist and the gastroenterologist.  Edward Jolly antineumoccica conjugada (PCV13): Lo que debe saber Pneumococcal Conjugate Vaccine (PCV13): What You Need to Know 1. Por qu vacunarse? La vacuna antineumoccica conjugada (PCV13) puede prevenir la enfermedad neumoccica. Enfermedad neumoccica hace referencia a cualquier enfermedad causada por bacterias neumoccicas. Estas bacterias pueden causar muchos tipos de enfermedades, entre ellas neumona, que es una infeccin de los pulmones. Las bacterias neumoccicas son Ardelia Mems de las causas ms comunes de la neumona. Adems de neumona, las bacterias neumoccicas tambin pueden causar lo siguiente:  Infecciones en los odos  Infecciones de los senos paranasales  Meningitis (infeccin del tejido que recubre el cerebro y la mdula espinal)  Bacteriemia (infeccin en el torrente sanguneo) Cualquier persona puede contraer la enfermedad neumoccica, pero los nios menores de 2 aos, las personas con [employer], los adultos mayores de 72 aos y los fumadores son los que corren un mayor riesgo. La mayora de las infecciones neumoccicas son leves. Sin embargo, algunas pueden causar problemas a largo plazo, como dao cerebral o prdida de la audicin. La meningitis, la bacteriemia y la neumona causadas por la enfermedad neumoccica pueden ser mortales. 2. PCV13 La vacuna PCV13 protege contra 13 tipos de bacterias que causan la enfermedad neumoccica. Los bebs y los nios pequeos por lo general necesitan 4 dosis de la vacuna antineumoccica conjugada, a los 2, 4, 6 y 33 a 15 meses. En algunos casos, un nio podra necesitar menos de 4 dosis para completar la vacunacin con la PCV13. Tambin se recomienda una dosis de la vacuna PCV23 para cualquier persona de 2 aos de edad o ms  con ciertas enfermedades si todava no recibi la vacuna PCV13. Esta vacuna se puede administrar a adultos de 65 aos o ms en funcin de las conversaciones entre el paciente y el mdico. 3. Hable con el mdico Comunquese con la persona que le coloca las vacunas si la persona que la recibe:  Ha tenido una reaccin alrgica despus de Ardelia Mems dosis previa de la vacuna PCV13, a una vacuna neumoccica conjugada anterior conocida como PCV7 o a cualquier vacuna que contenga toxoide diftrico (por ejemplo, difteria, ttanos y tos [place] [DTap]), o tiene [place] grave y potencialmente mortal.  En algunos casos, es posible que el mdico decida posponer la aplicacin de la vacuna PCV13 para una visita en el futuro. Las personas que sufren trastornos menores, como un resfro, pueden vacunarse. Las personas que tienen enfermedades moderadas o graves generalmente deben esperar hasta recuperarse para poder vacunarse con la PCV13. Su mdico puede darle ms informacin. 4. Riesgos de [place] reaccin a la vacuna  Puede sentir enrojecimiento, hinchazn, dolor o sensibilidad en TEFL teacher de la inyeccin, y Social worker, prdida del apetito, nerviosismo (irritabilidad), cansancio, dolor de [place] y escalofros despus de recibir la vacuna PCV13. Los nios pequeos pueden correr un mayor riesgo de sufrir convulsiones causadas por la fiebre despus de la administracin de la vacuna PCV13 si esta se administra al mismo tiempo que una vacuna contra la gripe inactivada. Consulte a su mdico para obtener ms informacin. Las personas a veces se desmayan despus de procedimientos mdicos, incluida la vacunacin. Informe al mdico si se siente mareado, tiene cambios en la visin o zumbidos en los odos. Al igual que con cualquier [employer], existe una probabilidad muy remota de que  una vacuna cause una reaccin alrgica grave, otra lesin grave o la muerte. 5. Qu pasa si se presenta un problema grave? Podra producirse  una reaccin alrgica despus de que la persona vacunada abandone la clnica. Si observa signos de Nurse, mental health grave (ronchas, hinchazn de la cara y la garganta, dificultad para respirar, latidos cardacos acelerados, mareos o debilidad), llame al 9-1-1 y lleve a la persona al hospital ms cercano. Si se presentan otros signos que le preocupan, comunquese con su mdico. Las reacciones adversas deben informarse al Sistema de Informe de Eventos Adversos de Clinical biochemist (Vaccine Adverse Event Reporting System, VAERS). Por lo general, el mdico presenta este informe o puede hacerlo usted mismo. Visite el sitio web del VAERS en [email or web address].es o llame al [phone].El VAERS es solo para Electrical engineer; su personal no proporciona asesoramiento mdico. 6. Programa Nacional de Compensacin de Daos por [place] de Compensacin de Daos por Clinical biochemist (National Vaccine Injury [employer], Runner, broadcasting/film/video) es un programa federal que fue creado para Patent examiner a las personas que puedan haber sufrido daos al recibir ciertas vacunas. Visite el sitio web del VICP en [email or web address].[email or web address].ee o llame al [phone])[phone] para obtener ms informacin acerca del programa y de cmo presentar un reclamo. Hay un lmite de tiempo para presentar un reclamo de compensacin. 7. Cmo puedo obtener ms informacin?  Pregntele a su mdico.  Comunquese con el servicio de salud de su localidad o su estado.  Comunquese con los Centros para el Control y la Prevencin de Probation officer for Disease Control and Prevention, CDC): ? Llame al [phone] (1-800-CDC-INFO) o ? Visite el sitio Biomedical engineer en http://hunter.com/ Declaracin de informacin de vacunas sobre la vacuna PCV13 (04/12/2018) Esta informacin no tiene Marine scientist el consejo del mdico. Asegrese de hacerle al mdico cualquier pregunta que tenga. Document Revised: 04/21/2018 Document Reviewed:  02/01/2018 Elsevier Patient Education  2020 [employer].

## 2019-12-25 LAB — BASIC METABOLIC PANEL
BUN/Creatinine Ratio: 20 (ref 12–28)
BUN: 37 mg/dL — ABNORMAL HIGH (ref 8–27)
CO2: 25 mmol/L (ref 20–29)
Calcium: 8.9 mg/dL (ref 8.7–10.3)
Chloride: 96 mmol/L (ref 96–106)
Creatinine, Ser: 1.85 mg/dL — ABNORMAL HIGH (ref 0.57–1.00)
GFR calc Af Amer: 32 mL/min/{1.73_m2} — ABNORMAL LOW (ref >59)
GFR calc non Af Amer: 28 mL/min/{1.73_m2} — ABNORMAL LOW (ref >59)
Glucose: 92 mg/dL (ref 65–99)
Potassium: 4.8 mmol/L (ref 3.5–5.2)
Sodium: 131 mmol/L — ABNORMAL LOW (ref 134–144)

## 2019-12-26 ENCOUNTER — Telehealth: Payer: Self-pay

## 2019-12-26 ENCOUNTER — Other Ambulatory Visit: Payer: Self-pay | Admitting: Internal Medicine

## 2019-12-26 DIAGNOSIS — N184 Chronic kidney disease, stage 4 (severe): Secondary | ICD-10-CM

## 2019-12-26 NOTE — Progress Notes (Signed)
Let patient know that her kidney function is not 100% and is worsened compared to 11 months and 2 months ago.  I will refer her to the kidney specialist for further evaluation.  Avoid taking any over-the-counter pain medications as these can make kidney function worse.  Please cancel her appointment with me that is scheduled for August 16 and give a follow-up appointment in 3 months.  Needs appt with Amy for Welcome to Medicare visit.

## 2019-12-26 NOTE — Telephone Encounter (Signed)
Contacted pt to go over lab results pt didn't answer lvm asking pt to give a call back at her earliest convenience. Carlos translated

## 2019-12-28 ENCOUNTER — Encounter: Payer: Self-pay | Admitting: Internal Medicine

## 2020-01-09 ENCOUNTER — Other Ambulatory Visit: Payer: Self-pay | Admitting: Internal Medicine

## 2020-01-09 DIAGNOSIS — L309 Dermatitis, unspecified: Secondary | ICD-10-CM

## 2020-01-28 ENCOUNTER — Ambulatory Visit: Payer: No Typology Code available for payment source | Admitting: Internal Medicine

## 2020-01-30 ENCOUNTER — Other Ambulatory Visit: Payer: Self-pay | Admitting: Nephrology

## 2020-01-30 DIAGNOSIS — N1832 Chronic kidney disease, stage 3b: Secondary | ICD-10-CM

## 2020-01-30 DIAGNOSIS — N179 Acute kidney failure, unspecified: Secondary | ICD-10-CM

## 2020-01-31 ENCOUNTER — Ambulatory Visit: Payer: No Typology Code available for payment source | Admitting: Internal Medicine

## 2020-02-04 ENCOUNTER — Other Ambulatory Visit: Payer: Self-pay

## 2020-02-04 ENCOUNTER — Encounter: Payer: Self-pay | Admitting: Family

## 2020-02-04 ENCOUNTER — Ambulatory Visit: Payer: Medicare Other | Attending: Family | Admitting: Family

## 2020-02-04 ENCOUNTER — Ambulatory Visit
Admission: RE | Admit: 2020-02-04 | Discharge: 2020-02-04 | Disposition: A | Discharge status: Home / Self Care | Payer: Medicare Other | Source: Ambulatory Visit | Attending: Nephrology | Admitting: Nephrology

## 2020-02-04 VITALS — BP 145/83 | HR 97 | Temp 98.7°F | Resp 16 | Ht 62.5 in | Wt 275.6 lb

## 2020-02-04 DIAGNOSIS — Z789 Other specified health status: Secondary | ICD-10-CM

## 2020-02-04 DIAGNOSIS — Z23 Encounter for immunization: Secondary | ICD-10-CM | POA: Diagnosis not present

## 2020-02-04 DIAGNOSIS — Z0000 Encounter for general adult medical examination without abnormal findings: Secondary | ICD-10-CM

## 2020-02-04 DIAGNOSIS — Z1159 Encounter for screening for other viral diseases: Secondary | ICD-10-CM

## 2020-02-04 DIAGNOSIS — E119 Type 2 diabetes mellitus without complications: Secondary | ICD-10-CM

## 2020-02-04 DIAGNOSIS — N1832 Chronic kidney disease, stage 3b: Secondary | ICD-10-CM

## 2020-02-04 DIAGNOSIS — Z0100 Encounter for examination of eyes and vision without abnormal findings: Secondary | ICD-10-CM | POA: Diagnosis not present

## 2020-02-04 DIAGNOSIS — N179 Acute kidney failure, unspecified: Secondary | ICD-10-CM

## 2020-02-04 NOTE — Progress Notes (Signed)
Subjective:   Chelsea Coleman is a 64 y.o. female who presents for an Initial Medicare Annual Wellness Visit.  Review of Systems      Objective:    Today's Vitals   02/04/20 0918  BP: (!) 145/83  Pulse: (!) 103  Resp: 16  Temp: 98.7 F (37.1 C)  SpO2: 98%  Weight: 275 lb 9.6 oz (125 kg)  Height: 5' 2.5" (1.588 m)   - Blood pressure not at goal during today's visit. Patient asymptomatic without chest pressure, chest pain, palpitations, and shortness of breath. Patient reports she did not take blood pressure medications this morning yet.  Body mass index is 49.6 kg/m.  Advanced Directives 02/04/2020  Does Patient Have a Medical Advance Directive? No  Would patient like information on creating a medical advance directive? Yes (Inpatient - patient defers creating a medical advance directive at this time - Information given)   - Discussed with patient what is a Soil scientist. I went over with her Living Will and [place].  Patient given written packet also. She will look over it and consider executing a Lliving Will.  If she does, I requested that she brings a copy of it for our records. Patient verbalized understanding.  Current Medications (verified) Outpatient Encounter Medications as of 02/04/2020  Medication Sig  . amLODipine (NORVASC) 5 MG tablet Take 1 tablet (5 mg total) by mouth daily. Stop Losartan  . atorvastatin (LIPITOR) 20 MG tablet Take 1 tablet (20 mg total) by mouth daily.  . Blood Glucose Monitoring Suppl (TRUE METRIX METER) w/Device KIT Use as directed  . clobetasol (TEMOVATE) 0.05 % external solution APPLY SMALL AMOUNT TO AFFECTED AREA 3 TIMES A WEEK AS NEEDED.  Marland Kitchen docusate sodium (COLACE) 100 MG capsule Take 1 capsule (100 mg total) by mouth 2 (two) times daily.  . furosemide (LASIX) 20 MG tablet TAKE 1 TABLET (20 MG TOTAL) BY MOUTH DAILY.  Marland Kitchen gabapentin (NEURONTIN) 400 MG capsule TAKE 1 CAPSULE (400 MG TOTAL)  BY MOUTH 2 (TWO) TIMES DAILY.  Marland Kitchen glucose blood (TRUE METRIX BLOOD GLUCOSE TEST) test strip Use as instructed  . insulin NPH-regular Human (70-30) 100 UNIT/ML injection Inj 20 units in a.m with breakfast and 10 units with dinner  . losartan (COZAAR) 50 MG tablet Take 50 mg by mouth daily.  Marland Kitchen omeprazole (PRILOSEC) 20 MG capsule TAKE 1 CAPSULE (20 MG TOTAL) BY MOUTH DAILY.  . TRUEplus Lancets 28G MISC Use as directed   No facility-administered encounter medications on file as of 02/04/2020.    Allergies (verified) Patient has no known allergies.   History: No past medical history on file. Past Surgical History:  Procedure Laterality Date  . ABDOMINAL HYSTERECTOMY     fibroid  . REFRACTIVE SURGERY  2017   BL   Family History  Problem Relation Age of Onset  . Diabetes Mother    Social History   Socioeconomic History  . Marital status: Married    Spouse name: Not on file  . Number of children: 1  . Years of education: 12 grade  . Highest education level: Not on file  Occupational History  . Occupation: unemployed  Tobacco Use  . Smoking status: Never Smoker  . Smokeless tobacco: Never Used  Vaping Use  . Vaping Use: Never used  Substance and Sexual Activity  . Alcohol use: Never  . Drug use: Never  . Sexual activity: Not on file  Other Topics Concern  .  Not on file  Social History Narrative  . Not on file   Social Determinants of Health   Financial Resource Strain:   . Difficulty of Paying Living Expenses: Not on file  Food Insecurity:   . Worried About Charity fundraiser in the Last Year: Not on file  . Ran Out of Food in the Last Year: Not on file  Transportation Needs:   . Lack of Transportation (Medical): Not on file  . Lack of Transportation (Non-Medical): Not on file  Physical Activity:   . Days of Exercise per Week: Not on file  . Minutes of Exercise per Session: Not on file  Stress:   . Feeling of Stress : Not on file  Social Connections:   .  Frequency of Communication with Friends and Family: Not on file  . Frequency of Social Gatherings with Friends and Family: Not on file  . Attends Religious Services: Not on file  . Active Member of Clubs or Organizations: Not on file  . Attends Archivist Meetings: Not on file  . Marital Status: Not on file    Tobacco Counseling - Denies smoking currently and in the past.  Clinical Intake:  Diabetic? Yes. Last visit 12/24/2019 with Dr. Wynetta Emery. During that encounter continued on NPH/regular insulin 70/30 and Gabapentin. Referred to Ophthalmology. Counseled patient to keep all scheduled appointments with primary physician.  Activities of Daily Living In your present state of health, do you have any difficulty performing the following activities: 02/04/2020  Hearing? N  Vision? Y  Difficulty concentrating or making decisions? N  Walking or climbing stairs? Y  Dressing or bathing? N  Doing errands, shopping? N  Preparing Food and eating ? N  Using the Toilet? Y  In the past six months, have you accidently leaked urine? N  Do you have problems with loss of bowel control? N  Managing your Medications? N  Managing your Finances? N  Housekeeping or managing your Housekeeping? N  Some recent data might be hidden   - Walking and climbing stairs - Reports she can't see well because of retinal detachment which alters walking/climbing stairs. Reports she is afraid of falling and that she sees small holes in the floor. Denies recent falls. Also, reports challenges with pain because of neuropathy especially on the right side. Reports using a cane daily on the right side for ambulation assistance. - Using the toilet - Reports problems with colon and constipation. Scheduled with Gastroenterology in September 2021.   Patient Care Team: Ladell Pier, MD as PCP - General (Internal Medicine)  Reports she does have other providers but that she does not remember/know their names.    Indicate any recent Medical Services you may have received from other than [facility] providers in the past year (date may be approximate).   Assessment:   This is a routine wellness examination for Chelsea Coleman.  Hearing/Vision screen No exam data present - Patient unable to see vision chart in clinic. 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 Reports she has never seen an Ophthalmologist for this before. Reports she has 20% vision in both eyes. Does not wear corrective lenses or contacts. - Whisper Test normal bilateral.    Dietary issues and exercise activities discussed: - " I dont eat a lot of food." - eating salads, fruit, vegetables - doesn't like meat  - eating chicken  - tortillas sometimes - drinking water, ICE sparkling  water  - denies exercise because of diabetic neuropathy   Goals   None    " Improve chronic disease management."  Depression Screen PHQ 2/9 Scores 02/04/2020 12/24/2019 09/27/2019 01/26/2019  PHQ - 2 Score 0 0 0 6  PHQ- 9 Score - - - 27    Fall Risk Fall Risk  02/04/2020 12/24/2019 05/18/2019 01/26/2019  Falls in the past year? 0 0 0 0  Number falls in past yr: 0 0 - -  Injury with Fall? 0 1 - -   Any stairs in or around the home? Yes, stairs outside of the home, 3 in front of home with handrails, 12 - 14 in back with handrails. No stairs inside of home.  If so, are there any without handrails? No  Home free of loose throw rugs in walkways, pet beds, electrical cords, etc? Yes  Adequate lighting in your home to reduce risk of falls? Yes   ASSISTIVE DEVICES UTILIZED TO PREVENT FALLS: Life alert? No  Use of a cane, walker or w/c? Yes  Grab bars in the bathroom? Yes  Shower chair or bench in shower? No   Elevated toilet seat or a handicapped toilet? Yes   TIMED UP AND GO: Was the test performed? Yes .  Length of time to ambulate 10 feet: 20 sec.   Gait slow and steady with assistive  device  Cognitive Function: MMSE - Mini Mental State Exam 02/04/2020  Orientation to time 5  Orientation to Place 5  Registration 3  Attention/ Calculation 5  Recall 3  Language- name 2 objects 2  Language- repeat 1  Language- follow 3 step command 3  Language- read & follow direction 1  Write a sentence 1  Copy design 0  Total score 29     Immunizations Immunization History  Administered Date(s) Administered  . Influenza,inj,Quad PF,6+ Mos 04/06/2019  . Pneumococcal Conjugate-13 12/24/2019  . Tdap 04/06/2019    TDAP status: Up to date Flu Vaccine status: Up to date Pneumococcal vaccine status: Completed during today's visit. Covid-19 vaccine status: Completed vaccines  Qualifies for Shingles Vaccine? No    Screening Tests Health Maintenance  Topic Date Due  . FOOT EXAM  Never done  . COVID-19 Vaccine (1) Never done  . COLONOSCOPY  Never done  . DEXA SCAN  Never done  . INFLUENZA VACCINE  01/13/2020  . Hepatitis C Screening  12/24/2020 (Originally 10-19-1955)  . HIV Screening  12/24/2020 (Originally 10/15/1970)  . HEMOGLOBIN A1C  04/01/2020  . OPHTHALMOLOGY EXAM  05/31/2020  . PNA vac Low Risk Adult (2 of 2 - PPSV23) 12/23/2020  . MAMMOGRAM  07/16/2021  . PAP SMEAR-Modifier  05/17/2022  . TETANUS/TDAP  04/05/2029    Health Maintenance  Health Maintenance Due  Topic Date Due  . FOOT EXAM  Never done  . COVID-19 Vaccine (1) Never done  . COLONOSCOPY  Never done  . DEXA SCAN  Never done  . INFLUENZA VACCINE  01/13/2020    Colorectal cancer screening: Referral to GI placed 12/24/2019. Pt aware the office will call re: appt. Appointment 02/14/2020. Mammogram status: Completed 08/04/2019. Repeat every year  Due 07/16/2021 Bone Density status: Ordered 02/04/2020. Pt provided with contact info and advised to call to schedule appt.  Lung Cancer Screening: (Low Dose CT Chest recommended if Age 18-80 years, 30 pack-year currently smoking OR have quit w/in 15years.) does  not qualify.   Lung Cancer Screening Referral: not applicable   Additional Screening:  Hepatitis C Screening:  does qualify; Completed 02/04/2020  Vision Screening: Recommended annual ophthalmology exams for early detection of glaucoma and other disorders of the eye. Is the patient up to date with their annual eye exam?  No  Who is the provider or what is the name of the office in which the patient attends annual eye exams? none If pt is not established with a provider, would they like to be referred to a provider to establish care? Yes .   Dental Screening: Recommended annual dental exams for proper oral hygiene, 5 months ago, cleaning, some porcelain teeth   Community Resource Referral / Chronic Care Management: CRR required this visit?  No   CCM required this visit?  No    Plan:  1. Encounter for Medicare annual wellness exam: - Patient presents today for Medicare annual wellness exam.  -  Blood pressure not at goal during today's visit. Patient asymptomatic without chest pressure, chest pain, palpitations, and shortness of breath. Patient reports she did not take blood pressure medications this morning yet. - Discussed with patient what is a Soil scientist. I went over with her Living Will and [place].  Patient given written packet also. She will look over it and consider executing a Lliving Will.  If she does, I requested that she brings a copy of it for our records. Patient verbalized understanding. - Shingles vaccination not administered during today's visit. Please offer at next visit with primary physician.  2. Diabetic eye exam [facility]) - Patient unable to see vision chart in clinic. 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 Reports she has never seen an Ophthalmologist for this before. Reports she has 20% vision in both eyes. Does not wear corrective lenses or contacts.  - Referral to  Ophthalmology for further evaluation and management. - Ambulatory referral to Ophthalmology   3. Flu vaccine need: - Administered today in clinic.   4. Need for hepatitis C screening test: - Hepatitis C screening test obtained today in clinic. - Hepatitis C Antibody  5. Language barrier: - [employer] participated during this visit. Interpreter Name: Horton Finer  ID#: [id number]  I have personally reviewed and noted the following in the patient's chart:   . Medical and social history . Use of alcohol, tobacco or illicit drugs  . Current medications and supplements . Functional ability and status . Nutritional status . Physical activity . Advanced directives . List of other physicians . Hospitalizations, surgeries, and ER visits in previous 12 months . Vitals . Screenings to include cognitive, depression, and falls . Referrals and appointments  In addition, I have reviewed and discussed with patient certain preventive protocols, quality metrics, and best practice recommendations. A written personalized care plan for preventive services as well as general preventive health recommendations were provided to patient.   Camillia Herter, NP   02/04/2020

## 2020-02-04 NOTE — Patient Instructions (Addendum)
Chelsea Coleman , Thank you for taking time to come for your Medicare Wellness Visit. I appreciate your ongoing commitment to your health goals. Please review the following plan we discussed and let me know if I can assist you in the future.   These are the goals we discussed: Goals   None     " I want to improve chronic conditions." This is a list of the screening recommended for you and due dates:  Health Maintenance  Topic Date Due  . Complete foot exam   Never done  . Colon Cancer Screening  Never done  . DEXA scan (bone density measurement)  Never done  . Flu Shot  01/13/2020  .  Hepatitis C: One time screening is recommended by Center for Disease Control  (CDC) for  adults born from [birth year] through 1966.   12/24/2020*  . HIV Screening  12/24/2020*  . Hemoglobin A1C  04/01/2020  . Eye exam for diabetics  05/31/2020  . Pneumonia vaccines (2 of 2 - PPSV23) 12/23/2020  . Mammogram  07/16/2021  . Pap Smear  05/17/2022  . Tetanus Vaccine  04/05/2029  . COVID-19 Vaccine  Completed  *Topic was postponed. The date shown is not the original due date.    Influenza Virus Vaccine injection (Fluarix) Qu es este medicamento? La VACUNA ANTIGRIPAL ayuda a disminuir el riesgo de contraer la influenza, tambin conocida como la gripe. La vacuna solo ayuda a protegerle contra algunas cepas de influenza. Esta vacuna no ayuda a reducir Catering manager de contraer influenza pandmica H1N1. Este medicamento puede ser utilizado para otros usos; si tiene alguna pregunta consulte con su proveedor de atencin mdica o con su farmacutico. MARCAS COMUNES: Fluarix, Fluzone Qu le debo informar a mi profesional de la salud antes de tomar este medicamento? Necesita saber si usted presenta alguno de los siguientes problemas o situaciones:  trastorno de sangrado como hemofilia  fiebre o infeccin  sndrome de Guillain-Barre u otros problemas neurolgicos  problemas del sistema inmunolgico  infeccin por el  virus de la inmunodeficiencia humana (VIH) o SIDA  niveles bajos de plaquetas en la sangre  esclerosis mltiple  una reaccin IT consultant o inusual a las vacunas antigripales, a los huevos, protenas de pollo, al ltex, a la gentamicina, a otros medicamentos, alimentos, colorantes o conservantes  si est embarazada o buscando quedar embarazada  si est amamantando a un beb Cmo debo [employer]? Esta vacuna se administra mediante inyeccin por va intramuscular. Lo administra un profesional de [employer]	[place]. Recibir una copia de informacin escrita sobre la vacuna antes de cada vacuna. Asegrese de leer este folleto cada vez cuidadosamente. Este folleto puede cambiar con frecuencia. Hable con su pediatra para informarse acerca del uso de este medicamento en nios. Puede requerir atencin especial. Sobredosis: Pngase en contacto inmediatamente con un centro toxicolgico o una sala de urgencia si usted cree que haya tomado demasiado medicamento. ATENCIN: [employer] es solo para usted. No comparta este medicamento con nadie. Qu sucede si me olvido de una dosis? No se aplica en este caso. Qu puede interactuar con este medicamento?  quimioterapia o radioterapia  medicamentos que suprimen el sistema inmunolgico, tales como etanercept, anakinra, infliximab y adalimumab  medicamentos que tratan o previenen cogulos sanguneos, como warfarina  fenitona  medicamentos esteroideos, como la prednisona o la cortisona  teofilina  vacunas Puede ser que esta lista no menciona todas las posibles interacciones. Informe a su profesional de [employer]	[place] de [employer] productos a base de hierbas,  medicamentos de venta libre o suplementos nutritivos que est tomando. Si usted fuma, consume bebidas alcohlicas o si utiliza drogas ilegales, indqueselo tambin a su profesional de [employer]	[place]. Algunas sustancias pueden interactuar con su medicamento. A qu debo estar atento al usar [employer]? Informe a su mdico o a Barrister's clerk de la [employer] todos los efectos secundarios que persistan despus de 3 das. Llame a su proveedor de atencin mdica si se presentan sntomas inusuales dentro de las 6 semanas posteriores a la vacunacin. Es posible que todava pueda contraer la gripe, pero la enfermedad no ser tan fuerte como normalmente. No puede contraer la gripe de esta vacuna. La vacuna antigripal no le protege contra resfros u otras enfermedades que pueden causar [place]. Debe vacunarse cada ao. Qu efectos secundarios puedo tener al [employer] este medicamento? Efectos secundarios que debe informar a su mdico o a Barrister's clerk de la salud tan pronto como sea posible:  Chief of Staff como erupcin cutnea, picazn o urticarias, hinchazn de la cara, labios o lengua Efectos secundarios que, por lo general, no requieren atencin mdica (debe informarlos a su mdico o a su profesional de la salud si persisten o si son molestos):  fiebre  dolor de cabeza  molestias y Forensic psychologist, sensibilidad, enrojecimiento o Estate agent de la inyeccin  cansancio o debilidad Puede ser que esta lista no menciona todos los posibles efectos secundarios. Comunquese a su mdico por asesoramiento mdico [employer]. Usted puede informar los efectos secundarios a la FDA por telfono al 1-800-FDA-1088. Dnde debo guardar mi medicina? Esta vacuna se administra solamente en clnicas, farmacias, consultorio mdico u otro consultorio de un profesional de la salud y no Sports coach en su domicilio. ATENCIN: Este folleto es un resumen. Puede ser que no cubra toda la posible informacin. Si usted tiene preguntas acerca de esta medicina, consulte con su mdico, su farmacutico o su profesional de Technical sales engineer.  2020 Elsevier/Gold Standard (2009-12-02 15:31:40)

## 2020-02-05 LAB — HEPATITIS C ANTIBODY: Hep C Virus Ab: 0.2 s/co ratio (ref 0.0–0.9)

## 2020-02-07 NOTE — Progress Notes (Signed)
Please call patient with update.   Hepatitis C negative.

## 2020-02-09 ENCOUNTER — Telehealth: Payer: Self-pay

## 2020-02-09 NOTE — Telephone Encounter (Signed)
[employer] Mickel Baas QB#[id number]  contacted pt to go over lab results pt didn't answer lvm asking pt to give a call on Monday to receive results

## 2020-02-14 ENCOUNTER — Other Ambulatory Visit: Payer: Self-pay

## 2020-02-14 ENCOUNTER — Ambulatory Visit (AMBULATORY_SURGERY_CENTER): Payer: Self-pay

## 2020-02-14 VITALS — Ht 62.0 in | Wt 207.0 lb

## 2020-02-14 DIAGNOSIS — N189 Chronic kidney disease, unspecified: Secondary | ICD-10-CM

## 2020-02-14 DIAGNOSIS — Z1211 Encounter for screening for malignant neoplasm of colon: Secondary | ICD-10-CM

## 2020-02-14 HISTORY — DX: Chronic kidney disease, unspecified: N18.9

## 2020-02-14 MED ORDER — SUTAB 1479-225-188 MG PO TABS: 12.0000 | ORAL_TABLET | ORAL | 0 refills | Status: DC

## 2020-02-14 NOTE — Progress Notes (Signed)
No egg or soy allergy known to patient  No issues with past sedation with any surgeries or procedures no intubation problems in the past  No FH of Malignant Hyperthermia No diet pills per patient No home 02 use per patient  No blood thinners per patient  Pt denies issues with constipation  No A fib or A flutter  EMMI video to pt or via [place] 19 guidelines implemented in [place] today with Pt and RN   Spanish interpreter present  [employer] given to pt in [facility] today , Code to Pharmacy   Due to the COVID-19 pandemic we are asking patients to follow these guidelines. Please only bring one care partner. Please be aware that your care partner may wait in the car in the parking lot or if they feel like they will be too hot to wait in the car, they may wait in the lobby on the 4th floor. All care partners are required to wear a mask the entire time (we do not have any that we can provide them), they need to practice social distancing, and we will do a Covid check for all patient's and care partners when you arrive. Also we will check their temperature and your temperature. If the care partner waits in their car they need to stay in the parking lot the entire time and we will call them on their cell phone when the patient is ready for discharge so they can bring the car to the front of the building. Also all patient's will need to wear a mask into building.

## 2020-02-20 ENCOUNTER — Encounter: Payer: Self-pay | Admitting: Internal Medicine

## 2020-02-22 ENCOUNTER — Telehealth: Payer: Self-pay | Admitting: Internal Medicine

## 2020-02-22 NOTE — Telephone Encounter (Signed)
Patient called stating she has developed a cough and would like to know if she should still have procedure on Weds 02/27/20.

## 2020-02-22 NOTE — Telephone Encounter (Signed)
Called patient, no answer. Left a message for the patient to call us back if she has any more new symptoms or worsen cough then she may need to cancel. She may have the procedure as scheduled if no new symptoms or worsen cough.

## 2020-02-27 ENCOUNTER — Encounter: Payer: No Typology Code available for payment source | Admitting: Internal Medicine

## 2020-02-27 ENCOUNTER — Encounter: Payer: Self-pay | Admitting: Internal Medicine

## 2020-02-27 ENCOUNTER — Telehealth: Payer: Self-pay | Admitting: *Deleted

## 2020-02-27 NOTE — Progress Notes (Signed)
I received note from Dr. Kathrine Haddock at [facility].  Patient seen 01/28/2020.  Diagnoses were as follows: -AKI: May be secondary to prerenal insults-ARB and diuretic.  Stop Cozaar and Lasix.  Obtain renal ultrasound. 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  Advised patient to avoid NSAIDs. -HTN with CKD: Stop Lasix and Cozaar.  Asked her to check blood pressure at home and to call us in 1 week with her blood pressure. -Anemia of CKD: Hemoglobin normal on last check.  Patient reported history of being on weekly injected medication-perhaps an ESA.  No indication for ESA at this time

## 2020-02-27 NOTE — Telephone Encounter (Signed)
NOS to colonoscopy- LMOM but went straight to voicemail.

## 2020-03-25 ENCOUNTER — Encounter: Payer: Self-pay | Admitting: Internal Medicine

## 2020-03-31 ENCOUNTER — Ambulatory Visit: Payer: Medicare Other | Attending: Internal Medicine | Admitting: Internal Medicine

## 2020-03-31 ENCOUNTER — Other Ambulatory Visit: Payer: Self-pay

## 2020-03-31 DIAGNOSIS — N1832 Chronic kidney disease, stage 3b: Secondary | ICD-10-CM | POA: Diagnosis not present

## 2020-04-07 DIAGNOSIS — N179 Acute kidney failure, unspecified: Secondary | ICD-10-CM | POA: Diagnosis not present

## 2020-04-07 DIAGNOSIS — N1831 Chronic kidney disease, stage 3a: Secondary | ICD-10-CM | POA: Diagnosis not present

## 2020-04-07 DIAGNOSIS — D631 Anemia in chronic kidney disease: Secondary | ICD-10-CM | POA: Diagnosis not present

## 2020-04-07 DIAGNOSIS — E1122 Type 2 diabetes mellitus with diabetic chronic kidney disease: Secondary | ICD-10-CM | POA: Diagnosis not present

## 2020-04-07 DIAGNOSIS — I129 Hypertensive chronic kidney disease with stage 1 through stage 4 chronic kidney disease, or unspecified chronic kidney disease: Secondary | ICD-10-CM | POA: Diagnosis not present

## 2020-04-28 ENCOUNTER — Other Ambulatory Visit: Payer: Self-pay

## 2020-04-28 ENCOUNTER — Ambulatory Visit: Payer: Medicare Other | Attending: Internal Medicine | Admitting: Internal Medicine

## 2020-04-28 ENCOUNTER — Other Ambulatory Visit: Payer: Self-pay | Admitting: Internal Medicine

## 2020-04-28 DIAGNOSIS — E1142 Type 2 diabetes mellitus with diabetic polyneuropathy: Secondary | ICD-10-CM

## 2020-04-28 DIAGNOSIS — E785 Hyperlipidemia, unspecified: Secondary | ICD-10-CM | POA: Diagnosis not present

## 2020-04-28 DIAGNOSIS — N1832 Chronic kidney disease, stage 3b: Secondary | ICD-10-CM | POA: Diagnosis not present

## 2020-04-28 DIAGNOSIS — E1169 Type 2 diabetes mellitus with other specified complication: Secondary | ICD-10-CM

## 2020-04-28 DIAGNOSIS — I10 Essential (primary) hypertension: Secondary | ICD-10-CM

## 2020-04-28 MED ORDER — ATORVASTATIN CALCIUM 20 MG PO TABS: 20.0000 mg | ORAL_TABLET | Freq: Every day | ORAL | 3 refills | Status: DC

## 2020-04-28 MED ORDER — INSULIN NPH ISOPHANE & REGULAR (70-30) 100 UNIT/ML ~~LOC~~ SUSP: SUBCUTANEOUS | 6 refills | Status: DC

## 2020-04-28 MED ORDER — GABAPENTIN 400 MG PO CAPS: 400.0000 mg | ORAL_CAPSULE | Freq: Two times a day (BID) | ORAL | 5 refills | Status: DC

## 2020-04-28 MED ORDER — LOSARTAN POTASSIUM 50 MG PO TABS: 50.0000 mg | ORAL_TABLET | Freq: Every day | ORAL | 1 refills | Status: DC

## 2020-04-28 MED ORDER — AMLODIPINE BESYLATE 5 MG PO TABS: 5.0000 mg | ORAL_TABLET | Freq: Every day | ORAL | 3 refills | Status: DC

## 2020-04-28 NOTE — Progress Notes (Signed)
Virtual Visit via Telephone Note Due to current restrictions/limitations of in-office visits due to the COVID-19 pandemic, this scheduled clinical appointment was converted to a telehealth visit  I connected with Chelsea Coleman on 04/28/20 at  2:30 PM EST by telephone and verified that I am speaking with the correct person using two identifiers.  Location: Patient: home Provider: office  A[facility]ail from [facility] interpreters used as interpreter.  Interpreter number is [phone] I discussed the limitations, risks, security and privacy concerns of performing an evaluation and management service by telephone and the availability of in person appointments. I also discussed with the patient that there may be a patient responsible charge related to this service. The patient expressed understanding and agreed to proceed.   History of Present Illness: Pt with hx of DM neuropathy and retinopathy, HTN,HL,obesity, anemia of chronic ds, thrombocytosis, CKD 3b.  Last seen 12/2019  DM: checks BS once every 2-3 wks.  Range 120-130 Reports she is doing well with eating habits.  Drinks tea without sugar Reports blurred vision.  Wants referral to retinal specialist for eye doctor.  Referred in 01/2020 but states she was not called.  Needs handicap sticker -Compliant with insulin 20 units a.m/10 units p.m  HTN/CKD 3b.:  Saw Dr. Royce Macadamia in 01/2020.  Lasix and Cozaar stopped.  Kept on Norvasc and Cozaar restarted 50 mg No CP, SOB, LE.  No device to check her blood pressure at home.  HL:  Taking and tolerating Lipitor  HM:  c-scope was scheduled for 05/26/2020 Outpatient Encounter Medications as of 04/28/2020  Medication Sig Note  . amLODipine (NORVASC) 5 MG tablet Take 1 tablet (5 mg total) by mouth daily. Stop Losartan (Patient not taking: Reported on 02/14/2020) 02/14/2020: Pt states Dr Wynetta Emery has instructed her to stop this medication for 2 months due to her kidneys.  Marland Kitchen atorvastatin (LIPITOR) 20 MG  tablet Take 1 tablet (20 mg total) by mouth daily.   . Blood Glucose Monitoring Suppl (TRUE METRIX METER) w/Device KIT Use as directed   . clobetasol (TEMOVATE) 0.05 % external solution APPLY SMALL AMOUNT TO AFFECTED AREA 3 TIMES A WEEK AS NEEDED.   Marland Kitchen docusate sodium (COLACE) 100 MG capsule Take 1 capsule (100 mg total) by mouth 2 (two) times daily.   . furosemide (LASIX) 20 MG tablet TAKE 1 TABLET (20 MG TOTAL) BY MOUTH DAILY. (Patient not taking: Reported on 02/14/2020) 02/14/2020: Pt states Dr Wynetta Emery had her stop this for 2 months due to kidneys  . gabapentin (NEURONTIN) 400 MG capsule TAKE 1 CAPSULE (400 MG TOTAL) BY MOUTH 2 (TWO) TIMES DAILY.   Marland Kitchen glucose blood (TRUE METRIX BLOOD GLUCOSE TEST) test strip Use as instructed   . insulin NPH-regular Human (70-30) 100 UNIT/ML injection Inj 20 units in a.m with breakfast and 10 units with dinner   . losartan (COZAAR) 50 MG tablet Take 50 mg by mouth daily. (Patient not taking: Reported on 02/14/2020)   . omeprazole (PRILOSEC) 20 MG capsule TAKE 1 CAPSULE (20 MG TOTAL) BY MOUTH DAILY.   Marland Kitchen OVER THE COUNTER MEDICATION Take 1 capsule by mouth at bedtime.   Marland Kitchen SUTAB [id number] MG TABS Take 12 tablets by mouth as directed. Take 12 tablets by mouth as directed the evening before the colonoscopy and 12 tablets by mouth as directed the morning before the colonoscopy.MANUFACTURER CODES!! BIN: [id number] PCN: CN GROUP: [id number] MEMBER ID: [id number];ASN AS CASH;NO PRIOR AUTHORIZATION   . TRUEplus Lancets 28G MISC Use as directed  No facility-administered encounter medications on file as of 04/28/2020.      Observations/Objective: Lab Results  Component Value Date   HGBA1C 7.9 (H) 10/01/2019   Lab Results  Component Value Date   WBC 9.6 10/01/2019   HGB 11.1 10/01/2019   HCT 33.1 (L) 10/01/2019   MCV 92 10/01/2019   PLT 487 (H) 10/01/2019     Chemistry      Component Value Date/Time   NA 131 (L) 12/24/2019 1711   K 4.8 12/24/2019 1711   CL 96  12/24/2019 1711   CO2 25 12/24/2019 1711   BUN 37 (H) 12/24/2019 1711   CREATININE 1.85 (H) 12/24/2019 1711      Component Value Date/Time   CALCIUM 8.9 12/24/2019 1711   ALKPHOS 124 (H) 10/01/2019 0926   AST 14 10/01/2019 0926   ALT 8 10/01/2019 0926   BILITOT 0.2 10/01/2019 0926     Depression screen PHQ 2/9 04/28/2020 02/04/2020 12/24/2019  Decreased Interest 0 0 0  Down, Depressed, Hopeless 0 0 0  PHQ - 2 Score 0 0 0  Altered sleeping - - -  Tired, decreased energy - - -  Change in appetite - - -  Feeling bad or failure about yourself  - - -  Trouble concentrating - - -  Moving slowly or fidgety/restless - - -  Suicidal thoughts - - -  PHQ-9 Score - - -      Assessment and Plan: 1. Type 2 diabetes, controlled, with peripheral neuropathy ([place]) I will resubmit referral for the ophthalmologist.  Message sent to our referral coordinator. She will continue her current dose of insulin.  She will come to the lab at her earliest convenience for A1c. - Ambulatory referral to Ophthalmology - Hemoglobin A1c; Future - insulin NPH-regular Human (70-30) 100 UNIT/ML injection; Inj 20 units in a.m with breakfast and 10 units with dinner  Dispense: 10 mL; Refill: 6 - gabapentin (NEURONTIN) 400 MG capsule; Take 1 capsule (400 mg total) by mouth 2 (two) times daily.  Dispense: 60 capsule; Refill: 5  2. Essential hypertension Continue current medications and low-salt diet. - amLODipine (NORVASC) 5 MG tablet; Take 1 tablet (5 mg total) by mouth daily. Stop Losartan  Dispense: 90 tablet; Refill: 3 - losartan (COZAAR) 50 MG tablet; Take 1 tablet (50 mg total) by mouth daily.  Dispense: 90 tablet; Refill: 1  3. Hyperlipidemia associated with type 2 diabetes mellitus (HCC) - atorvastatin (LIPITOR) 20 MG tablet; Take 1 tablet (20 mg total) by mouth daily.  Dispense: 90 tablet; Refill: 3  4. Stage 3b chronic kidney disease ([place]) Followed by [facility].   Follow Up  Instructions: 2 mths   I discussed the assessment and treatment plan with the patient. The patient was provided an opportunity to ask questions and all were answered. The patient agreed with the plan and demonstrated an understanding of the instructions.   The patient was advised to call back or seek an in-person evaluation if the symptoms worsen or if the condition fails to improve as anticipated.  I provided 22 minutes of non-face-to-face time during this encounter.   Karle Plumber, MD

## 2020-05-01 ENCOUNTER — Telehealth: Payer: Self-pay | Admitting: Internal Medicine

## 2020-05-01 NOTE — Telephone Encounter (Signed)
-----   Message from [name] sent at 04/30/2020  5:08 PM EST ----- Regarding: RE: Ophthalmology referral Rejection Reason - Patient did not respond - Never received a call back" [facility] said on Apr 28, 2020 10:35 AM  "[facility] on 9/9 and left final msg on 10/14" [facility] said on Mar 27, 2020 11:01 AM ----- Message ----- From: Ladell Pier, MD Sent: 04/28/2020   5:56 PM EST To: [name] Subject: Ophthalmology referral                         Refer to ophthalmology back in August.  Patient states she was never called.  I have resubmitted the referral.  Can you please facilitate.

## 2020-05-05 ENCOUNTER — Other Ambulatory Visit: Payer: Self-pay

## 2020-05-05 ENCOUNTER — Ambulatory Visit: Payer: Medicare Other | Attending: Internal Medicine

## 2020-05-05 DIAGNOSIS — E1142 Type 2 diabetes mellitus with diabetic polyneuropathy: Secondary | ICD-10-CM

## 2020-05-06 LAB — HEMOGLOBIN A1C
Est. average glucose Bld gHb Est-mCnc: 174 mg/dL
Hgb A1c MFr Bld: 7.7 % — ABNORMAL HIGH (ref 4.8–5.6)

## 2020-05-07 ENCOUNTER — Encounter: Payer: Self-pay | Admitting: Internal Medicine

## 2020-05-07 ENCOUNTER — Telehealth: Payer: Self-pay

## 2020-05-07 NOTE — Progress Notes (Signed)
Let patient know that her A1c is 7.7 with goal being less than 7.  Continue current dose of insulin.  Continue to work on trying to eat healthy and exercise as tolerated.

## 2020-05-07 NOTE — Telephone Encounter (Signed)
[employer] Cori Razor  Id# [id number]  contacted pt to go over lab results pt is aware and doesn't have any questions or concerns

## 2020-05-07 NOTE — Progress Notes (Signed)
Received lab from Dr. Harrie Jeans.  Labs collected 03/31/2020. Creatinine 1.24/GFR 46.  Parathyroid hormone level 58.  Patient seen by nephrologist 04/07/2020.  CKD 3A: Told to stop furosemide and Cozaar. Restart amlodipine 5 mg daily. Anemia of CKD: Hemoglobin normal on last check.  No indication for ESA at this time. Vitamin D deficiency: Continue vitamin D 1000 units daily

## 2020-05-12 ENCOUNTER — Telehealth: Payer: Self-pay | Admitting: Internal Medicine

## 2020-05-12 NOTE — Telephone Encounter (Signed)
Pt would like a call from either her nurse or PCP regarding her latest results. Pt did not understand the numbers and would need an explanation about what those numbers mean. Please advise and thank you

## 2020-05-12 NOTE — Telephone Encounter (Signed)
Called pt left message to call back

## 2020-05-12 NOTE — Telephone Encounter (Signed)
Called pt made aware of MD results/ Verbalized understanding    "Let patient know that her A1c is 7.7 with goal being less than 7.  Continue current dose of insulin.  Continue to work on trying to eat healthy and exercise as tolerated"

## 2020-05-12 NOTE — Telephone Encounter (Signed)
Patient returned call from nurse and stated that she would like the nurse to call her again. Please advise

## 2020-05-26 ENCOUNTER — Encounter: Payer: Self-pay | Admitting: Internal Medicine

## 2020-05-26 ENCOUNTER — Other Ambulatory Visit: Payer: Self-pay

## 2020-05-26 ENCOUNTER — Ambulatory Visit (AMBULATORY_SURGERY_CENTER): Payer: Medicare Other | Admitting: Internal Medicine

## 2020-05-26 VITALS — BP 162/78 | HR 70 | Temp 97.3°F | Resp 14 | Ht 62.0 in | Wt 207.0 lb

## 2020-05-26 DIAGNOSIS — K6389 Other specified diseases of intestine: Secondary | ICD-10-CM

## 2020-05-26 DIAGNOSIS — K635 Polyp of colon: Secondary | ICD-10-CM | POA: Diagnosis not present

## 2020-05-26 DIAGNOSIS — D120 Benign neoplasm of cecum: Secondary | ICD-10-CM | POA: Diagnosis not present

## 2020-05-26 DIAGNOSIS — Z1211 Encounter for screening for malignant neoplasm of colon: Secondary | ICD-10-CM

## 2020-05-26 MED ORDER — SODIUM CHLORIDE 0.9 % IV SOLN
500.0000 mL | Freq: Once | INTRAVENOUS | Status: DC
Start: 2020-05-26 — End: 2020-05-26

## 2020-05-26 NOTE — Op Note (Signed)
[facility] Patient Name: Chelsea Coleman Procedure Date: 05/26/2020 9:44 AM MRN: [id number] Endoscopist: Docia Chuck. Henrene Pastor , MD Age: 64 Referring MD:  Date of Birth: 03-20-1956 Gender: Female Account #: [id number] Procedure:                Colonoscopy with cold snare polypectomy x 2; with                            biopsies Indications:              Screening for colorectal malignant neoplasm Medicines:                Monitored Anesthesia Care Procedure:                Pre-Anesthesia Assessment:                           - Prior to the procedure, a History and Physical                            was performed, and patient medications and                            allergies were reviewed. The patient's tolerance of                            previous anesthesia was also reviewed. The risks                            and benefits of the procedure and the sedation                            options and risks were discussed with the patient.                            All questions were answered, and informed consent                            was obtained. Prior Anticoagulants: The patient has                            taken no previous anticoagulant or antiplatelet                            agents. ASA Grade Assessment: II - A patient with                            mild systemic disease. After reviewing the risks                            and benefits, the patient was deemed in                            satisfactory condition to undergo the procedure.  After obtaining informed consent, the colonoscope                            was passed under direct vision. Throughout the                            procedure, the patient's blood pressure, pulse, and                            oxygen saturations were monitored continuously. The                            Colonoscope was introduced through the anus and                            advanced to the the  cecum, identified by                            appendiceal orifice and ileocecal valve. The                            ileocecal valve, appendiceal orifice, and rectum                            were photographed. The quality of the bowel                            preparation was good. The colonoscopy was performed                            without difficulty. The patient tolerated the                            procedure well. The bowel preparation used was                            SUPREP via split dose instruction. Scope In: 10:06:05 AM Scope Out: 10:19:17 AM Scope Withdrawal Time: 0 hours 10 minutes 2 seconds  Total Procedure Duration: 0 hours 13 minutes 12 seconds  Findings:                 Two polyps were found in the cecum. The polyps were                            2 to 3 mm in size. These polyps were removed with a                            cold snare. Resection and retrieval were complete.                           A diffuse area of mildly melanotic mucosa was found                            in  the entire colon. Biopsies were taken with a                            cold forceps for histology.                           Internal hemorrhoids were found during retroflexion.                           The exam was otherwise without abnormality on                            direct and retroflexion views. Complications:            No immediate complications. Estimated blood loss:                            None. Estimated Blood Loss:     Estimated blood loss: none. Impression:               - Two 2 to 3 mm polyps in the cecum, removed with a                            cold snare. Resected and retrieved.                           - Melanotic mucosa in the entire examined colon.                            Biopsied.                           - Internal hemorrhoids.                           - The examination was otherwise normal on direct                            and retroflexion  views. Recommendation:           - Repeat colonoscopy in 5-10 years for surveillance.                           - Patient has a contact number available for                            emergencies. The signs and symptoms of potential                            delayed complications were discussed with the                            patient. Return to normal activities tomorrow.                            Written discharge instructions were provided to the  patient.                           - Resume previous diet.                           - Continue present medications.                           - Await pathology results. Docia Chuck. Henrene Pastor, MD 05/26/2020 10:36:16 AM This report has been signed electronically.

## 2020-05-26 NOTE — Progress Notes (Signed)
Interpreter used today at the [facility] for this pt.  Interpreter's name is-Alis Lenice Llamas

## 2020-05-26 NOTE — Progress Notes (Signed)
VS taken by C.W. 

## 2020-05-26 NOTE — Progress Notes (Signed)
Called to room to assist during endoscopic procedure.  Patient ID and intended procedure confirmed with present staff. Received instructions for my participation in the procedure from the performing physician.  

## 2020-05-26 NOTE — Patient Instructions (Signed)
YOU HAD AN ENDOSCOPIC PROCEDURE TODAY AT THE [facility]:   Refer to the procedure report that was given to you for any specific questions about what was found during the examination.  If the procedure report does not answer your questions, please call your gastroenterologist to clarify.  If you requested that your care partner not be given the details of your procedure findings, then the procedure report has been included in a sealed envelope for you to review at your convenience later.  YOU SHOULD EXPECT: Some feelings of bloating in the abdomen. Passage of more gas than usual.  Walking can help get rid of the air that was put into your GI tract during the procedure and reduce the bloating. If you had a lower endoscopy (such as a colonoscopy or flexible sigmoidoscopy) you may notice spotting of blood in your stool or on the toilet paper. If you underwent a bowel prep for your procedure, you may not have a normal bowel movement for a few days.  Please Note:  You might notice some irritation and congestion in your nose or some drainage.  This is from the oxygen used during your procedure.  There is no need for concern and it should clear up in a day or so.  SYMPTOMS TO REPORT IMMEDIATELY:   Following lower endoscopy (colonoscopy or flexible sigmoidoscopy):  Excessive amounts of blood in the stool  Significant tenderness or worsening of abdominal pains  Swelling of the abdomen that is new, acute  Fever of 100F or higher  For urgent or emergent issues, a gastroenterologist can be reached at any hour by calling [phone]. Do not use MyChart messaging for urgent concerns.    DIET:  We do recommend a small meal at first, but then you may proceed to your regular diet.  Drink plenty of fluids but you should avoid alcoholic beverages for 24 hours.  ACTIVITY:  You should plan to take it easy for the rest of today and you should NOT DRIVE or use heavy machinery until tomorrow (because  of the sedation medicines used during the test).    FOLLOW UP: Our staff will call the number listed on your records 48-72 hours following your procedure to check on you and address any questions or concerns that you may have regarding the information given to you following your procedure. If we do not reach you, we will leave a message.  We will attempt to reach you two times.  During this call, we will ask if you have developed any symptoms of COVID 19. If you develop any symptoms (ie: fever, flu-like symptoms, shortness of breath, cough etc.) before then, please call [phone].  If you test positive for Covid 19 in the 2 weeks post procedure, please call and report this information to us.    If any biopsies were taken you will be contacted by phone or by letter within the next 1-3 weeks.  Please call us at [phone] if you have not heard about the biopsies in 3 weeks.    SIGNATURES/CONFIDENTIALITY: You and/or your care partner have signed paperwork which will be entered into your electronic medical record.  These signatures attest to the fact that that the information above on your After Visit Summary has been reviewed and is understood.  Full responsibility of the confidentiality of this discharge information lies with you and/or your care-partner. 

## 2020-05-26 NOTE — Progress Notes (Signed)
A and O x3. Report to RN. Tolerated MAC anesthesia well.

## 2020-05-28 ENCOUNTER — Telehealth: Payer: Self-pay

## 2020-05-28 NOTE — Telephone Encounter (Signed)
°  Follow up Call-  Call back number 05/26/2020  Post procedure Call Back phone  # [phone]  Permission to leave phone message Yes     Patient questions:  Do you have a fever, pain , or abdominal swelling? No. Pain Score  0 *  Have you tolerated food without any problems? Yes.    Have you been able to return to your normal activities? Yes.    Do you have any questions about your discharge instructions: Diet   No. Medications  No. Follow up visit  No.  Do you have questions or concerns about your Care? No.  Actions: * If pain score is 4 or above: No action needed, pain <4.  1. Have you developed a fever since your procedure? no  2.   Have you had an respiratory symptoms (SOB or cough) since your procedure? no  3.   Have you tested positive for COVID 19 since your procedure no  4.   Have you had any family members/close contacts diagnosed with the COVID 19 since your procedure?  No    If yes to any of these questions please route to Joylene John, RN and Joella Prince, RN

## 2020-05-29 ENCOUNTER — Encounter: Payer: Self-pay | Admitting: Internal Medicine

## 2020-06-02 ENCOUNTER — Ambulatory Visit: Payer: Medicare Other | Admitting: Internal Medicine

## 2020-06-25 ENCOUNTER — Telehealth (INDEPENDENT_AMBULATORY_CARE_PROVIDER_SITE_OTHER): Payer: Self-pay

## 2020-06-25 NOTE — Telephone Encounter (Signed)
Copied from [place] [id number]. Topic: Referral - Request for Referral >> Jun 24, 2020  1:28 PM Celene Kras wrote: Has patient seen PCP for this complaint? Yes.   *If NO, is insurance requiring patient see PCP for this issue before PCP can refer them? Referral for which specialty: Ophthalmologist   Preferred provider/office: N/A Reason for referral: Pts first referral was sent to a retina specialist, but pt states that she first must see a regular ophthalmologist. Pt is requesting to have someone in her insurance. Please advise.

## 2020-06-25 NOTE — Telephone Encounter (Signed)
Chelsea Coleman could you reach out to pt in regards to referral

## 2020-07-28 DIAGNOSIS — E119 Type 2 diabetes mellitus without complications: Secondary | ICD-10-CM | POA: Diagnosis not present

## 2020-09-23 ENCOUNTER — Telehealth: Payer: Self-pay

## 2020-09-23 ENCOUNTER — Other Ambulatory Visit: Payer: Self-pay | Admitting: Internal Medicine

## 2020-09-23 ENCOUNTER — Other Ambulatory Visit: Payer: Self-pay

## 2020-09-23 DIAGNOSIS — E1142 Type 2 diabetes mellitus with diabetic polyneuropathy: Secondary | ICD-10-CM

## 2020-09-23 MED FILL — Losartan Potassium Tab 50 MG: ORAL | 30 days supply | Qty: 30 | Fill #0 | Status: AC

## 2020-09-23 MED FILL — Gabapentin Cap 400 MG: ORAL | 30 days supply | Qty: 60 | Fill #0 | Status: AC

## 2020-09-23 MED FILL — Amlodipine Besylate Tab 5 MG (Base Equivalent): ORAL | 30 days supply | Qty: 30 | Fill #0 | Status: AC

## 2020-09-23 MED FILL — Atorvastatin Calcium Tab 20 MG (Base Equivalent): ORAL | 30 days supply | Qty: 30 | Fill #0 | Status: AC

## 2020-09-23 NOTE — Telephone Encounter (Signed)
Patient last seen in 11/21 with no scheduled follow-ups on file. Once she makes an appointment, I can send this for her.

## 2020-09-23 NOTE — Telephone Encounter (Signed)
Please fill if appropriate.  

## 2020-09-23 NOTE — Telephone Encounter (Signed)
Patient came into to request a refill for her insulin NPH-regular Human (70-30) 100 UNIT/ML injection    Patient uses   [facility], [address]   Please advice [phone]  Patient is completely out of medication

## 2020-09-23 NOTE — Telephone Encounter (Signed)
   Notes to clinic: review for requested change of medication    Requested Prescriptions  Pending Prescriptions Disp Refills   insulin NPH-regular Human (HUMULIN 70/30) (70-30) 100 UNIT/ML injection 10 mL 6    Sig: INJ 20 UNITS IN A.M WITH BREAKFAST AND 10 UNITS WITH DINNER      Endocrinology:  Diabetes - Insulins Failed - 09/23/2020 11:55 AM      Failed - HBA1C is between 0 and 7.9 and within 180 days    No results found for: HGBA1C, LABA1C        Failed - Valid encounter within last 6 months    Recent Outpatient Visits   None

## 2020-09-24 ENCOUNTER — Other Ambulatory Visit: Payer: Self-pay

## 2020-09-24 MED ORDER — INSULIN NPH ISOPHANE & REGULAR (70-30) 100 UNIT/ML ~~LOC~~ SUSP
SUBCUTANEOUS | 1 refills | Status: DC
  Filled 2020-09-24: qty 10, 33d supply, fill #0

## 2020-09-24 NOTE — Telephone Encounter (Signed)
Please contact pt and schedule an appt with any available provider or refer to mobile bus

## 2020-09-24 NOTE — Telephone Encounter (Signed)
Rx sent 

## 2020-09-24 NOTE — Addendum Note (Signed)
Addended by: Daisy Blossom, Annie Main L on: 09/24/2020 12:19 PM   Modules accepted: Orders

## 2020-09-24 NOTE — Telephone Encounter (Signed)
Patient scheduled for an appointment with Dr. Wynetta Emery Monday May 23rd at 9:10am.

## 2020-09-25 NOTE — Telephone Encounter (Signed)
Called patient using interpreter services. Advised patient her medication had been sent and advised her we would be closed tomorrow. Patient understood.

## 2020-09-29 ENCOUNTER — Other Ambulatory Visit: Payer: Self-pay

## 2020-09-29 DIAGNOSIS — N1831 Chronic kidney disease, stage 3a: Secondary | ICD-10-CM | POA: Diagnosis not present

## 2020-10-06 DIAGNOSIS — E875 Hyperkalemia: Secondary | ICD-10-CM | POA: Diagnosis not present

## 2020-10-06 DIAGNOSIS — E1129 Type 2 diabetes mellitus with other diabetic kidney complication: Secondary | ICD-10-CM | POA: Diagnosis not present

## 2020-10-06 DIAGNOSIS — I129 Hypertensive chronic kidney disease with stage 1 through stage 4 chronic kidney disease, or unspecified chronic kidney disease: Secondary | ICD-10-CM | POA: Diagnosis not present

## 2020-10-06 DIAGNOSIS — D631 Anemia in chronic kidney disease: Secondary | ICD-10-CM | POA: Diagnosis not present

## 2020-10-06 DIAGNOSIS — N1832 Chronic kidney disease, stage 3b: Secondary | ICD-10-CM | POA: Diagnosis not present

## 2020-10-06 DIAGNOSIS — E1122 Type 2 diabetes mellitus with diabetic chronic kidney disease: Secondary | ICD-10-CM | POA: Diagnosis not present

## 2020-10-06 DIAGNOSIS — R809 Proteinuria, unspecified: Secondary | ICD-10-CM | POA: Diagnosis not present

## 2020-10-08 ENCOUNTER — Other Ambulatory Visit: Payer: Self-pay

## 2020-10-20 DIAGNOSIS — N1832 Chronic kidney disease, stage 3b: Secondary | ICD-10-CM | POA: Diagnosis not present

## 2020-11-03 ENCOUNTER — Encounter: Payer: Self-pay | Admitting: Internal Medicine

## 2020-11-03 ENCOUNTER — Other Ambulatory Visit: Payer: Self-pay | Admitting: Pharmacist

## 2020-11-03 ENCOUNTER — Other Ambulatory Visit: Payer: Self-pay

## 2020-11-03 ENCOUNTER — Ambulatory Visit: Payer: Medicare Other | Attending: Internal Medicine | Admitting: Internal Medicine

## 2020-11-03 VITALS — BP 145/60 | HR 79 | Resp 16 | Wt 208.6 lb

## 2020-11-03 DIAGNOSIS — E785 Hyperlipidemia, unspecified: Secondary | ICD-10-CM

## 2020-11-03 DIAGNOSIS — Z780 Asymptomatic menopausal state: Secondary | ICD-10-CM | POA: Diagnosis not present

## 2020-11-03 DIAGNOSIS — I129 Hypertensive chronic kidney disease with stage 1 through stage 4 chronic kidney disease, or unspecified chronic kidney disease: Secondary | ICD-10-CM | POA: Diagnosis not present

## 2020-11-03 DIAGNOSIS — N183 Chronic kidney disease, stage 3 unspecified: Secondary | ICD-10-CM | POA: Diagnosis not present

## 2020-11-03 DIAGNOSIS — M546 Pain in thoracic spine: Secondary | ICD-10-CM

## 2020-11-03 DIAGNOSIS — Z9181 History of falling: Secondary | ICD-10-CM

## 2020-11-03 DIAGNOSIS — E1142 Type 2 diabetes mellitus with diabetic polyneuropathy: Secondary | ICD-10-CM

## 2020-11-03 DIAGNOSIS — E669 Obesity, unspecified: Secondary | ICD-10-CM | POA: Diagnosis not present

## 2020-11-03 DIAGNOSIS — E1169 Type 2 diabetes mellitus with other specified complication: Secondary | ICD-10-CM | POA: Diagnosis not present

## 2020-11-03 DIAGNOSIS — E1122 Type 2 diabetes mellitus with diabetic chronic kidney disease: Secondary | ICD-10-CM | POA: Diagnosis not present

## 2020-11-03 LAB — POCT GLYCOSYLATED HEMOGLOBIN (HGB A1C): HbA1c, POC (controlled diabetic range): 8.4 % — AB (ref 0.0–7.0)

## 2020-11-03 LAB — GLUCOSE, POCT (MANUAL RESULT ENTRY): POC Glucose: 174 mg/dl — AB (ref 70–99)

## 2020-11-03 MED ORDER — AMLODIPINE BESYLATE 5 MG PO TABS
7.5000 mg | ORAL_TABLET | Freq: Every day | ORAL | 3 refills | Status: DC
  Filled 2020-11-03: qty 135, 90d supply, fill #0
  Filled 2021-01-15 – 2021-04-06 (×2): qty 135, 90d supply, fill #1

## 2020-11-03 MED ORDER — INSULIN NPH ISOPHANE & REGULAR (70-30) 100 UNIT/ML ~~LOC~~ SUSP
SUBCUTANEOUS | 6 refills | Status: DC
  Filled 2020-11-03: qty 10, 30d supply, fill #0
  Filled 2020-12-01: qty 10, 30d supply, fill #1
  Filled 2021-01-05: qty 30, 90d supply, fill #2
  Filled 2021-01-05: qty 10, 30d supply, fill #2
  Filled 2021-01-05: qty 30, 100d supply, fill #2
  Filled 2021-07-13: qty 10, 33d supply, fill #0
  Filled 2021-09-09: qty 10, 30d supply, fill #1

## 2020-11-03 MED ORDER — ACCU-CHEK GUIDE VI STRP
ORAL_STRIP | 2 refills | Status: AC
  Filled 2020-11-03: qty 100, 33d supply, fill #0
  Filled 2021-02-02: qty 100, 33d supply, fill #1

## 2020-11-03 MED ORDER — TRUEPLUS PEN NEEDLES 32G X 4 MM MISC
2 refills | Status: DC
  Filled 2020-11-03: qty 100, 50d supply, fill #0

## 2020-11-03 MED ORDER — ACCU-CHEK GUIDE ME W/DEVICE KIT
PACK | 0 refills | Status: AC
  Filled 2020-11-03: qty 1, 1d supply, fill #0

## 2020-11-03 MED ORDER — ACCU-CHEK FASTCLIX LANCETS MISC
2 refills | Status: AC
  Filled 2020-11-03: qty 100, 33d supply, fill #0
  Filled 2021-02-02: qty 100, 33d supply, fill #1

## 2020-11-03 MED ORDER — DAPAGLIFLOZIN PROPANEDIOL 5 MG PO TABS
5.0000 mg | ORAL_TABLET | Freq: Every day | ORAL | 6 refills | Status: DC
  Filled 2020-11-03 – 2020-12-01 (×3): qty 30, 30d supply, fill #0
  Filled 2021-01-05: qty 30, 30d supply, fill #1
  Filled 2021-02-02: qty 30, 30d supply, fill #2
  Filled 2021-03-09: qty 30, 30d supply, fill #3
  Filled 2021-09-09: qty 30, 30d supply, fill #0
  Filled 2021-10-09: qty 30, 30d supply, fill #1

## 2020-11-03 MED ORDER — GABAPENTIN 400 MG PO CAPS
ORAL_CAPSULE | ORAL | 5 refills | Status: DC
  Filled 2020-11-03: qty 60, 30d supply, fill #0
  Filled 2020-12-01: qty 60, 30d supply, fill #1
  Filled 2021-01-05: qty 60, 30d supply, fill #2
  Filled 2021-02-02: qty 60, 30d supply, fill #3
  Filled 2021-03-09: qty 60, 30d supply, fill #4
  Filled 2021-04-06: qty 60, 30d supply, fill #5

## 2020-11-03 MED ORDER — TRUE METRIX BLOOD GLUCOSE TEST VI STRP
ORAL_STRIP | 12 refills | Status: DC
  Filled 2020-11-03: qty 100, 30d supply, fill #0

## 2020-11-03 MED FILL — Losartan Potassium Tab 50 MG: ORAL | 30 days supply | Qty: 30 | Fill #1 | Status: AC

## 2020-11-03 MED FILL — Atorvastatin Calcium Tab 20 MG (Base Equivalent): ORAL | 30 days supply | Qty: 30 | Fill #1 | Status: AC

## 2020-11-03 NOTE — Patient Instructions (Signed)
Your blood pressure is not at goal.  We have increased amlodipine to 1-1/2 tablets daily.  Your diabetes is not at goal.  We have added a low-dose of a medication called Wilder Glade that you will take once a day.  Please be sure to drink several glasses of water daily to stay hydrated while you are on this medication.

## 2020-11-03 NOTE — Progress Notes (Signed)
Patient ID: Chelsea Coleman, female    DOB: 02-11-1956  MRN: [id number]  CC: Diabetes and Hypertension   Subjective: Chelsea Coleman is a 65 y.o. female who presents for chronic disease management.  Last eval 04/2020 Her concerns today include:  Pt with hx of DM neuropathy and retinopathy, HTN,HL,obesity, anemiaof chronic ds, thrombocytosis, CKD 3b, ACD, Vit D.  DM/Obesity: Results for orders placed or performed in visit on 11/03/20  POCT glucose (manual entry)  Result Value Ref Range   POC Glucose 174 (A) 70 - 99 mg/dl  POCT glycosylated hemoglobin (Hb A1C)  Result Value Ref Range   Hemoglobin A1C     HbA1c POC (<> result, manual entry)     HbA1c, POC (prediabetic range)     HbA1c, POC (controlled diabetic range) 8.4 (A) 0.0 - 7.0 %  -out of stripes x 2 wks.  She was checking 4-5 x a wk.  Gives range 130-200 On NPH/Regular insulin 20/10 consistently Doing Okay with eating habits.  Wgh stable. Doing okay on Gabapentin 400 mg BID for neuropathy. Saw [facility] the end of March 2022.  Told they will call her with refeerral to see a retina specialist but she has not heard back  HTN/CKD 3:  Reports taking Cozaar 50 mg and Norvasc 5 mg daily.  However based on last note that I got from her neurologist Dr. Royce Macadamia, furosemide and Cozaar were discontinued and patient was restarted on just amlodipine 5 mg daily. Has device to check BP but has not checked recently.  Tries to limit salt in foods No CP/SOB/HA edema.  Some dizziness occasionally lasting for a few sec if she gets up to quickly.   Fell last wk.  She was mopping a floor and went to sit in a recliner chair and the chair slipped out from under her.  Landed on her back.  No injury but has some soreness LT flank  HL: taking and tolerating Lipitor.  Patient Active Problem List   Diagnosis Date Noted  . Stage 3b chronic kidney disease ([place]) 04/06/2019  . Hyperlipidemia associated with type 2 diabetes  mellitus ([place]) 04/06/2019  . Gastroesophageal reflux disease without esophagitis 04/06/2019  . Type 2 diabetes, controlled, with peripheral neuropathy ([place]) 01/26/2019  . Diabetic retinopathy associated with controlled type 2 diabetes mellitus ([place]) 01/26/2019  . Essential hypertension 01/26/2019  . Anemia 01/26/2019  . Drug-induced constipation 01/26/2019  . Memory difficulties 01/26/2019  . Positive depression screening 01/26/2019     Current Outpatient Medications on File Prior to Visit  Medication Sig Dispense Refill  . atorvastatin (LIPITOR) 20 MG tablet TAKE 1 TABLET (20 MG TOTAL) BY MOUTH DAILY. 90 tablet 3  . clobetasol (TEMOVATE) 0.05 % external solution APPLY SMALL AMOUNT TO AFFECTED AREA 3 TIMES A WEEK AS NEEDED. 50 mL 0  . docusate sodium (COLACE) 100 MG capsule Take 1 capsule (100 mg total) by mouth 2 (two) times daily. 60 capsule 3  . losartan (COZAAR) 50 MG tablet TAKE 1 TABLET (50 MG TOTAL) BY MOUTH DAILY. 90 tablet 1  . omeprazole (PRILOSEC) 20 MG capsule TAKE 1 CAPSULE (20 MG TOTAL) BY MOUTH DAILY. 30 capsule 3  . OVER THE COUNTER MEDICATION Take 1 capsule by mouth at bedtime.     No current facility-administered medications on file prior to visit.    No Known Allergies  Social History   Socioeconomic History  . Marital status: Married    Spouse name: Not on file  .  Number of children: 1  . Years of education: 12 grade  . Highest education level: Not on file  Occupational History  . Occupation: unemployed  Tobacco Use  . Smoking status: Never Smoker  . Smokeless tobacco: Never Used  Vaping Use  . Vaping Use: Never used  Substance and Sexual Activity  . Alcohol use: Never  . Drug use: Never  . Sexual activity: Not on file  Other Topics Concern  . Not on file  Social History Narrative  . Not on file   Social Determinants of Health   Financial Resource Strain: Not on file  Food Insecurity: Not on file  Transportation Needs: Not on file  Physical  Activity: Not on file  Stress: Not on file  Social Connections: Not on file  Intimate Partner Violence: Not on file    Family History  Problem Relation Age of Onset  . Diabetes Mother   . Colon cancer Neg Hx   . Colon polyps Neg Hx   . Esophageal cancer Neg Hx   . Rectal cancer Neg Hx   . Stomach cancer Neg Hx     Past Surgical History:  Procedure Laterality Date  . ABDOMINAL HYSTERECTOMY     fibroid  . CHOLECYSTECTOMY    . REFRACTIVE SURGERY  2017   BL    ROS: Review of Systems Negative except as stated above  PHYSICAL EXAM: BP (!) 145/60   Pulse 79   Resp 16   Wt 208 lb 9.6 oz (94.6 kg)   SpO2 98%   BMI 38.15 kg/m   Wt Readings from Last 3 Encounters:  11/03/20 208 lb 9.6 oz (94.6 kg)  05/26/20 207 lb (93.9 kg)  02/14/20 207 lb (93.9 kg)    Physical Exam  General appearance - alert, well appearing, older Hispanic female and in no distress Mental status - normal mood, behavior, speech, dress, motor activity, and thought processes Mouth - mucous membranes moist, pharynx normal without lesions Neck - supple, no significant adenopathy Chest - clear to auscultation, no wheezes, rales or rhonchi, symmetric air entry Heart - normal rate, regular rhythm, normal S1, S2, no murmurs, rubs, clicks or gallops Extremities - peripheral pulses normal, no pedal edema, no clubbing or cyanosis Ambulates with a cane (states she uses a cane because of poor vision) MSK: Mild tenderness on palpation of mid thoracic paraspinal muscles on either side.  Gait is stable. Diabetic Foot Exam - Simple   Simple Foot Form Visual Inspection No deformities, no ulcerations, no other skin breakdown bilaterally: Yes Sensation Testing Intact to touch and monofilament testing bilaterally: Yes Pulse Check Posterior Tibialis and Dorsalis pulse intact bilaterally: Yes Comments     Depression screen [facility] 2/9 04/28/2020 02/04/2020 12/24/2019  Decreased Interest 0 0 0  Down, Depressed, Hopeless  0 0 0  PHQ - 2 Score 0 0 0  Altered sleeping - - -  Tired, decreased energy - - -  Change in appetite - - -  Feeling bad or failure about yourself  - - -  Trouble concentrating - - -  Moving slowly or fidgety/restless - - -  Suicidal thoughts - - -  PHQ-9 Score - - -    CMP Latest Ref Rng & Units 12/24/2019 10/01/2019 01/26/2019  Glucose 65 - 99 mg/dL 92 140(H) 158(H)  BUN 8 - 27 mg/dL 37(H) 28(H) 20  Creatinine 0.57 - 1.00 mg/dL 1.85(H) 1.57(H) 1.29(H)  Sodium 134 - 144 mmol/L 131(L) 133(L) 130(L)  Potassium 3.5 - 5.2  mmol/L 4.8 5.4(H) 5.2  Chloride 96 - 106 mmol/L 96 99 95(L)  CO2 20 - 29 mmol/L 25 19(L) 21  Calcium 8.7 - 10.3 mg/dL 8.9 9.1 9.4  Total Protein 6.0 - 8.5 g/dL - 6.9 6.9  Total Bilirubin 0.0 - 1.2 mg/dL - 0.2 0.2  Alkaline Phos 39 - 117 IU/L - 124(H) 101  AST 0 - 40 IU/L - 14 13  ALT 0 - 32 IU/L - 8 9   Lipid Panel     Component Value Date/Time   CHOL 150 10/01/2019 0926   TRIG 171 (H) 10/01/2019 0926   HDL 53 10/01/2019 0926   CHOLHDL 2.8 10/01/2019 0926   LDLCALC 68 10/01/2019 0926    CBC    Component Value Date/Time   WBC 9.6 10/01/2019 0926   RBC 3.60 (L) 10/01/2019 0926   HGB 11.1 10/01/2019 0926   HCT 33.1 (L) 10/01/2019 0926   PLT 487 (H) 10/01/2019 0926   MCV 92 10/01/2019 0926   MCH 30.8 10/01/2019 0926   MCHC 33.5 10/01/2019 0926   RDW 12.9 10/01/2019 0926    ASSESSMENT AND PLAN: 1. Type 2 diabetes, controlled, with peripheral neuropathy ([place]) Not at goal with A1c increasing since last visit. Discussed and encourage healthy eating habits.  She will continue NPH/regular insulin 20 units in the morning/10 units in the evening.  Add low-dose [place].  Advised patient of the importance of drinking several glasses of water daily while on this medication. - POCT glucose (manual entry) - POCT glycosylated hemoglobin (Hb A1C) - CBC - Comprehensive metabolic panel - Lipid panel - Microalbumin / creatinine urine ratio - insulin NPH-regular  Human (70-30) 100 UNIT/ML injection; Inj 20 units in a.m with breakfast and 10 units with dinner  Dispense: 10 mL; Refill: 6 - dapagliflozin propanediol (FARXIGA) 5 MG TABS tablet; Take 1 tablet (5 mg total) by mouth daily before breakfast.  Dispense: 30 tablet; Refill: 6 - gabapentin (NEURONTIN) 400 MG capsule; TAKE 1 CAPSULE (400 MG TOTAL) BY MOUTH 2 (TWO) TIMES DAILY.  Dispense: 60 capsule; Refill: 5  2. Hypertension associated with stage 3 chronic kidney disease due to type 2 diabetes mellitus ([place]) Not at goal.  Continue Cozaar.  Increase amlodipine to 7.5 mg daily. - amLODipine (NORVASC) 5 MG tablet; Take 1.5 tablets (7.5 mg total) by mouth daily.  Dispense: 135 tablet; Refill: 3  3. Obesity (BMI 35.0-39.9 without comorbidity) Discussed and encourage healthy eating habits.  4. History of recent fall Patient reports some limitation in vision due to retinopathy.  Advised to be careful on wet surfaces and to use her cane to confirm the position of objects like she is before sitting down  5. Acute bilateral thoracic back pain Recommend Tylenol as needed.  I do not suspect anything is broken.  6. Hyperlipidemia associated with type 2 diabetes mellitus ([place]) Continue statin therapy.  7. Postmenopausal estrogen deficiency Due for osteoporosis screening.  She is agreeable to having bone density study. - DG Bone Density; Future     Patient was given the opportunity to ask questions.  Patient verbalized understanding of the plan and was able to repeat key elements of the plan.  [employer] Language interpreter used during this encounter. #[id number], [place], Joseph #[id number]  Orders Placed This Encounter  Procedures  . DG Bone Density  . CBC  . Comprehensive metabolic panel  . Lipid panel  . Microalbumin / creatinine urine ratio  . POCT glucose (manual entry)  . POCT glycosylated hemoglobin (Hb A1C)  Requested Prescriptions   Signed Prescriptions Disp Refills  . insulin NPH-regular  Human (70-30) 100 UNIT/ML injection 10 mL 6    Sig: Inj 20 units in a.m with breakfast and 10 units with dinner  . dapagliflozin propanediol (FARXIGA) 5 MG TABS tablet 30 tablet 6    Sig: Take 1 tablet (5 mg total) by mouth daily before breakfast.  . gabapentin (NEURONTIN) 400 MG capsule 60 capsule 5    Sig: TAKE 1 CAPSULE (400 MG TOTAL) BY MOUTH 2 (TWO) TIMES DAILY.  Marland Kitchen amLODipine (NORVASC) 5 MG tablet 135 tablet 3    Sig: Take 1.5 tablets (7.5 mg total) by mouth daily.    Return in about 4 months (around 03/06/2021) for Give appt with Lurena Joiner after August 23 for Medicare Wellness Visit.  Karle Plumber, MD, FACP

## 2020-11-04 LAB — COMPREHENSIVE METABOLIC PANEL
ALT: 9 IU/L (ref 0–32)
AST: 11 IU/L (ref 0–40)
Albumin/Globulin Ratio: 1 — ABNORMAL LOW (ref 1.2–2.2)
Albumin: 3.8 g/dL (ref 3.8–4.8)
Alkaline Phosphatase: 119 IU/L (ref 44–121)
BUN/Creatinine Ratio: 15 (ref 12–28)
BUN: 21 mg/dL (ref 8–27)
Bilirubin Total: 0.3 mg/dL (ref 0.0–1.2)
CO2: 25 mmol/L (ref 20–29)
Calcium: 9.4 mg/dL (ref 8.7–10.3)
Chloride: 92 mmol/L — ABNORMAL LOW (ref 96–106)
Creatinine, Ser: 1.43 mg/dL — ABNORMAL HIGH (ref 0.57–1.00)
Globulin, Total: 3.7 g/dL (ref 1.5–4.5)
Glucose: 166 mg/dL — ABNORMAL HIGH (ref 65–99)
Potassium: 4.9 mmol/L (ref 3.5–5.2)
Sodium: 131 mmol/L — ABNORMAL LOW (ref 134–144)
Total Protein: 7.5 g/dL (ref 6.0–8.5)
eGFR: 40 mL/min/{1.73_m2} — ABNORMAL LOW (ref >59)

## 2020-11-04 LAB — LIPID PANEL
Chol/HDL Ratio: 2.6 ratio (ref 0.0–4.4)
Cholesterol, Total: 161 mg/dL (ref 100–199)
HDL: 62 mg/dL (ref >39)
LDL Chol Calc (NIH): 78 mg/dL (ref 0–99)
Triglycerides: 120 mg/dL (ref 0–149)
VLDL Cholesterol Cal: 21 mg/dL (ref 5–40)

## 2020-11-04 LAB — CBC
Hematocrit: 35.2 % (ref 34.0–46.6)
Hemoglobin: 11.5 g/dL (ref 11.1–15.9)
MCH: 29.3 pg (ref 26.6–33.0)
MCHC: 32.7 g/dL (ref 31.5–35.7)
MCV: 90 fL (ref 79–97)
Platelets: 541 10*3/uL — ABNORMAL HIGH (ref 150–450)
RBC: 3.92 x10E6/uL (ref 3.77–5.28)
RDW: 12.7 % (ref 11.7–15.4)
WBC: 11.7 10*3/uL — ABNORMAL HIGH (ref 3.4–10.8)

## 2020-11-04 LAB — MICROALBUMIN / CREATININE URINE RATIO
Creatinine, Urine: 70.6 mg/dL
Microalb/Creat Ratio: 13 mg/g creat (ref 0–29)
Microalbumin, Urine: 9.3 ug/mL

## 2020-11-05 ENCOUNTER — Telehealth: Payer: Self-pay | Admitting: Physician Assistant

## 2020-11-05 ENCOUNTER — Other Ambulatory Visit: Payer: Self-pay | Admitting: Internal Medicine

## 2020-11-05 DIAGNOSIS — D75839 Thrombocytosis, unspecified: Secondary | ICD-10-CM

## 2020-11-05 DIAGNOSIS — D72829 Elevated white blood cell count, unspecified: Secondary | ICD-10-CM

## 2020-11-05 NOTE — Progress Notes (Signed)
Let patient know that her white blood cell count and platelet cell count are elevated.  I would like to refer her to a blood specialist called hematologist to evaluate this further.  Referral submitted.  Kidney function is not 100% but stable.  Cholesterol level is good.

## 2020-11-05 NOTE — Telephone Encounter (Signed)
Received a new hem referral from Dr. Wynetta Emery for thrombocytosis/leukocytosis. Ms Chelsea Coleman has been scheduled to see Murray Hodgkins on 6/7 at 2pm. Letter mailed.

## 2020-11-06 ENCOUNTER — Other Ambulatory Visit: Payer: Self-pay

## 2020-11-12 ENCOUNTER — Other Ambulatory Visit: Payer: Self-pay

## 2020-11-13 ENCOUNTER — Other Ambulatory Visit: Payer: Self-pay

## 2020-11-18 ENCOUNTER — Encounter: Payer: Self-pay | Admitting: Physician Assistant

## 2020-11-18 ENCOUNTER — Inpatient Hospital Stay (HOSPITAL_BASED_OUTPATIENT_CLINIC_OR_DEPARTMENT_OTHER): Payer: Medicare Other | Admitting: Physician Assistant

## 2020-11-18 ENCOUNTER — Inpatient Hospital Stay: Payer: Medicare Other | Attending: Physician Assistant

## 2020-11-18 ENCOUNTER — Other Ambulatory Visit: Payer: Self-pay

## 2020-11-18 VITALS — BP 146/95 | HR 98 | Temp 97.0°F | Resp 18 | Wt 209.2 lb

## 2020-11-18 DIAGNOSIS — E1136 Type 2 diabetes mellitus with diabetic cataract: Secondary | ICD-10-CM | POA: Insufficient documentation

## 2020-11-18 DIAGNOSIS — Z833 Family history of diabetes mellitus: Secondary | ICD-10-CM | POA: Diagnosis not present

## 2020-11-18 DIAGNOSIS — E785 Hyperlipidemia, unspecified: Secondary | ICD-10-CM | POA: Insufficient documentation

## 2020-11-18 DIAGNOSIS — E1142 Type 2 diabetes mellitus with diabetic polyneuropathy: Secondary | ICD-10-CM | POA: Insufficient documentation

## 2020-11-18 DIAGNOSIS — D649 Anemia, unspecified: Secondary | ICD-10-CM | POA: Diagnosis not present

## 2020-11-18 DIAGNOSIS — N183 Chronic kidney disease, stage 3 unspecified: Secondary | ICD-10-CM | POA: Diagnosis not present

## 2020-11-18 DIAGNOSIS — D72829 Elevated white blood cell count, unspecified: Secondary | ICD-10-CM | POA: Diagnosis not present

## 2020-11-18 DIAGNOSIS — K219 Gastro-esophageal reflux disease without esophagitis: Secondary | ICD-10-CM | POA: Diagnosis not present

## 2020-11-18 DIAGNOSIS — I129 Hypertensive chronic kidney disease with stage 1 through stage 4 chronic kidney disease, or unspecified chronic kidney disease: Secondary | ICD-10-CM | POA: Insufficient documentation

## 2020-11-18 DIAGNOSIS — E663 Overweight: Secondary | ICD-10-CM | POA: Diagnosis not present

## 2020-11-18 DIAGNOSIS — Z794 Long term (current) use of insulin: Secondary | ICD-10-CM | POA: Insufficient documentation

## 2020-11-18 DIAGNOSIS — D75839 Thrombocytosis, unspecified: Secondary | ICD-10-CM | POA: Insufficient documentation

## 2020-11-18 DIAGNOSIS — Z79899 Other long term (current) drug therapy: Secondary | ICD-10-CM | POA: Diagnosis not present

## 2020-11-18 DIAGNOSIS — E1122 Type 2 diabetes mellitus with diabetic chronic kidney disease: Secondary | ICD-10-CM | POA: Diagnosis not present

## 2020-11-18 LAB — CBC WITH DIFFERENTIAL (CANCER CENTER ONLY)
Abs Immature Granulocytes: 0.05 10*3/uL (ref 0.00–0.07)
Basophils Absolute: 0 10*3/uL (ref 0.0–0.1)
Basophils Relative: 0 %
Eosinophils Absolute: 0.1 10*3/uL (ref 0.0–0.5)
Eosinophils Relative: 1 %
HCT: 32.3 % — ABNORMAL LOW (ref 36.0–46.0)
Hemoglobin: 10.8 g/dL — ABNORMAL LOW (ref 12.0–15.0)
Immature Granulocytes: 0 %
Lymphocytes Relative: 14 %
Lymphs Abs: 1.7 10*3/uL (ref 0.7–4.0)
MCH: 30 pg (ref 26.0–34.0)
MCHC: 33.4 g/dL (ref 30.0–36.0)
MCV: 89.7 fL (ref 80.0–100.0)
Monocytes Absolute: 0.5 10*3/uL (ref 0.1–1.0)
Monocytes Relative: 4 %
Neutro Abs: 9.5 10*3/uL — ABNORMAL HIGH (ref 1.7–7.7)
Neutrophils Relative %: 81 %
Platelet Count: 478 10*3/uL — ABNORMAL HIGH (ref 150–400)
RBC: 3.6 MIL/uL — ABNORMAL LOW (ref 3.87–5.11)
RDW: 12.6 % (ref 11.5–15.5)
WBC Count: 11.9 10*3/uL — ABNORMAL HIGH (ref 4.0–10.5)
nRBC: 0 % (ref 0.0–0.2)

## 2020-11-18 LAB — RETIC PANEL
Immature Retic Fract: 8.9 % (ref 2.3–15.9)
RBC.: 3.68 MIL/uL — ABNORMAL LOW (ref 3.87–5.11)
Retic Count, Absolute: 51.2 10*3/uL (ref 19.0–186.0)
Retic Ct Pct: 1.4 % (ref 0.4–3.1)
Reticulocyte Hemoglobin: 34 pg (ref >27.9)

## 2020-11-18 LAB — IRON AND TIBC
Iron: 58 ug/dL (ref 41–142)
Saturation Ratios: 24 % (ref 21–57)
TIBC: 248 ug/dL (ref 236–444)
UIBC: 189 ug/dL (ref 120–384)

## 2020-11-18 LAB — FERRITIN: Ferritin: 281 ng/mL (ref 11–307)

## 2020-11-18 LAB — SEDIMENTATION RATE: Sed Rate: 71 mm/hr — ABNORMAL HIGH (ref 0–22)

## 2020-11-18 LAB — C-REACTIVE PROTEIN: CRP: 1.7 mg/dL — ABNORMAL HIGH (ref <1.0)

## 2020-11-18 LAB — VITAMIN B12: Vitamin B-12: 494 pg/mL (ref 180–914)

## 2020-11-18 LAB — FOLATE: Folate: 10.4 ng/mL (ref >5.9)

## 2020-11-18 NOTE — Progress Notes (Signed)
Chelsea Coleman [phone]   Fax:([phone] [place] NOTE  Patient Care Team: Ladell Pier, MD as PCP - General (Internal Medicine)  Hematological/Oncological History 1) Labs from PCP, Dr. Karle Plumber: -01/26/2019: WBC 10.0, Hgb 10.4 (L), MCV 90, Plt 553 (H), Iron 50, TIBC 228 (L), Ferritin 573 (H), Iron Saturation 22, Folate 11 -10/01/2019: WBC 9.6, Hgb 11.1, MCV 92, Plt 487 (H) -11/03/2020: WBC 11.7 (H), Hgb 11.5, MCV 90, Plt 541 (H)  2) 11/18/2020: Establish care with Dede Query PA-C  CHIEF COMPLAINTS/PURPOSE OF CONSULTATION:  "Leukocytosis and Thrombocytosis "  HISTORY OF PRESENTING ILLNESS:  Chelsea Coleman 65 y.o. female with medical history significant for HTN, GERD, Hyperlipidemia, CKD Stage 3, DM complicated by peripheral neuropathy and retinopathy. Patient is unaccompanied for this visit.   On exam today, Ms. Coghlan reports that her energy levels are overall stable. She has a good appetite but notes that she is overweight. She is able to complete all her ADLs on her own. She does have dietary restrictions including no green vegetables and minimal red meat intake. She denies any nausea, vomiting or abdominal pain. She reports constipation but notes daily bowel movements. She denies easy bruising or signs of bleeding. This includes hematochezia, melena, hematuria, hemoptysis, epistaxis and gingival bleeding. In the last 1-2 weeks, patient notes that she has been dizzy with movement and at rest. She denies any vision changes, headaches or other focal deficits. She denies any syncopal episodes. Patient denies any fevers, chills, night sweats, shortness of breath, chest pain or cough. She has no other complaints. Rest of the 10 point ROS is below.  MEDICAL HISTORY:  Past Medical History:  Diagnosis Date  . Cataract    states removed in 2014  . Chronic kidney disease 02/14/2020   Stage 3   . Diabetes mellitus without  complication ([place])   . GERD (gastroesophageal reflux disease)   . Hypertension     SURGICAL HISTORY: Past Surgical History:  Procedure Laterality Date  . ABDOMINAL HYSTERECTOMY     fibroid  . CHOLECYSTECTOMY    . REFRACTIVE SURGERY  2017   BL    SOCIAL HISTORY: Social History   Socioeconomic History  . Marital status: Married    Spouse name: Not on file  . Number of children: 1  . Years of education: 12 grade  . Highest education level: Not on file  Occupational History  . Occupation: unemployed  Tobacco Use  . Smoking status: Never Smoker  . Smokeless tobacco: Never Used  Vaping Use  . Vaping Use: Never used  Substance and Sexual Activity  . Alcohol use: Never  . Drug use: Never  . Sexual activity: Not on file  Other Topics Concern  . Not on file  Social History Narrative  . Not on file   Social Determinants of Health   Financial Resource Strain: Not on file  Food Insecurity: Not on file  Transportation Needs: Not on file  Physical Activity: Not on file  Stress: Not on file  Social Connections: Not on file  Intimate Partner Violence: Not on file    FAMILY HISTORY: Family History  Problem Relation Age of Onset  . Diabetes Mother   . Colon cancer Neg Hx   . Colon polyps Neg Hx   . Esophageal cancer Neg Hx   . Rectal cancer Neg Hx   . Stomach cancer Neg Hx     ALLERGIES:  has No Known Allergies.  MEDICATIONS:  Current Outpatient Medications  Medication Sig Dispense Refill  . Accu-Chek FastClix Lancets MISC Use to check blood sugar 3 TIMES DAILY. (Patient taking differently: Use to check blood sugar 3 TIMES DAILY.) 100 each 2  . amLODipine (NORVASC) 5 MG tablet Take 1.5 tablets (7.5 mg total) by mouth daily. 135 tablet 3  . atorvastatin (LIPITOR) 20 MG tablet TAKE 1 TABLET (20 MG TOTAL) BY MOUTH DAILY. 90 tablet 3  . Blood Glucose Monitoring Suppl (ACCU-CHEK GUIDE ME) w/Device KIT Use to check blood sugar 3 TIMES DAILY. (Patient taking differently:  Use to check blood sugar 3 TIMES DAILY.) 1 kit 0  . clobetasol (TEMOVATE) 0.05 % external solution APPLY SMALL AMOUNT TO AFFECTED AREA 3 TIMES A WEEK AS NEEDED. 50 mL 0  . dapagliflozin propanediol (FARXIGA) 5 MG TABS tablet Take 1 tablet (5 mg total) by mouth daily before breakfast. 30 tablet 6  . docusate sodium (COLACE) 100 MG capsule Take 1 capsule (100 mg total) by mouth 2 (two) times daily. 60 capsule 3  . gabapentin (NEURONTIN) 400 MG capsule TAKE 1 CAPSULE (400 MG TOTAL) BY MOUTH 2 (TWO) TIMES DAILY. 60 capsule 5  . glucose blood (ACCU-CHEK GUIDE) test strip Use to check blood sugar 3 TIMES DAILY. (Patient taking differently: Use to check blood sugar 3 TIMES DAILY.) 100 each 2  . insulin NPH-regular Human (70-30) 100 UNIT/ML injection Inj 20 units in a.m with breakfast and 10 units with dinner 10 mL 6  . Insulin Pen Needle (TRUEPLUS PEN NEEDLES) 32G X 4 MM MISC Use to inject insulin twice daily. 100 each 2  . losartan (COZAAR) 50 MG tablet TAKE 1 TABLET (50 MG TOTAL) BY MOUTH DAILY. 90 tablet 1  . omeprazole (PRILOSEC) 20 MG capsule TAKE 1 CAPSULE (20 MG TOTAL) BY MOUTH DAILY. 30 capsule 3  . OVER THE COUNTER MEDICATION Take 1 capsule by mouth at bedtime.     No current facility-administered medications for this visit.    REVIEW OF SYSTEMS:   Constitutional: ( - ) fevers, ( - )  chills , ( - ) night sweats Eyes: ( - ) blurriness of vision, ( - ) double vision, ( - ) watery eyes Ears, nose, mouth, throat, and face: ( - ) mucositis, ( - ) sore throat Respiratory: ( - ) cough, ( - ) dyspnea, ( - ) wheezes Cardiovascular: ( - ) palpitation, ( - ) chest discomfort, ( - ) lower extremity swelling Gastrointestinal:  ( - ) nausea, ( - ) heartburn, ( - ) change in bowel habits Skin: ( - ) abnormal skin rashes Lymphatics: ( - ) new lymphadenopathy, ( - ) easy bruising Neurological: ( - ) numbness, ( - ) tingling, ( - ) new weaknesses Behavioral/Psych: ( - ) mood change, ( - ) new changes  All  other systems were reviewed with the patient and are negative.  PHYSICAL EXAMINATION: ECOG PERFORMANCE STATUS: 0 - Asymptomatic  Vitals:   11/18/20 1320  BP: (!) 146/95  Pulse: 98  Resp: 18  Temp: (!) 97 F (36.1 C)  SpO2: 98%   Filed Weights   11/18/20 1320  Weight: 209 lb 3.2 oz (94.9 kg)    GENERAL: well appearing female in NAD, obese SKIN: skin color, texture, turgor are normal, no rashes or significant lesions EYES: conjunctiva are pink and non-injected, sclera clear OROPHARYNX: no exudate, no erythema; lips, buccal mucosa, and tongue normal  LYMPH:  no palpable lymphadenopathy in the cervical, axillary or supraclavicular lymph  nodes.  LUNGS: clear to auscultation and percussion with normal breathing effort HEART: regular rate & rhythm and no murmurs and no lower extremity edema ABDOMEN: soft, non-tender, non-distended, normal bowel sounds Musculoskeletal: no cyanosis of digits and no clubbing  PSYCH: alert & oriented x 3, fluent speech NEURO: no focal motor/sensory deficits  LABORATORY DATA:  I have reviewed the data as listed CBC Latest Ref Rng & Units 11/18/2020 11/03/2020 10/01/2019  WBC 4.0 - 10.5 K/uL 11.9(H) 11.7(H) 9.6  Hemoglobin 12.0 - 15.0 g/dL 10.8(L) 11.5 11.1  Hematocrit 36.0 - 46.0 % 32.3(L) 35.2 33.1(L)  Platelets 150 - 400 K/uL 478(H) 541(H) 487(H)    CMP Latest Ref Rng & Units 11/03/2020 12/24/2019 10/01/2019  Glucose 65 - 99 mg/dL 166(H) 92 140(H)  BUN 8 - 27 mg/dL 21 37(H) 28(H)  Creatinine 0.57 - 1.00 mg/dL 1.43(H) 1.85(H) 1.57(H)  Sodium 134 - 144 mmol/L 131(L) 131(L) 133(L)  Potassium 3.5 - 5.2 mmol/L 4.9 4.8 5.4(H)  Chloride 96 - 106 mmol/L 92(L) 96 99  CO2 20 - 29 mmol/L 25 25 19(L)  Calcium 8.7 - 10.3 mg/dL 9.4 8.9 9.1  Total Protein 6.0 - 8.5 g/dL 7.5 - 6.9  Total Bilirubin 0.0 - 1.2 mg/dL 0.3 - 0.2  Alkaline Phos 44 - 121 IU/L 119 - 124(H)  AST 0 - 40 IU/L 11 - 14  ALT 0 - 32 IU/L 9 - 8    ASSESSMENT & PLAN Chelsea Coleman is a 65 y.o. female who presents to the clinic for evaluation for leukocytosis and thrombocytosis. I reviewed potential etiologies for thrombocytosis including iron deficiency anemia, inflammatory processes, s/p splenectomy and bone marrow disorders. In addition, possible causes for leukocytosis includes infectious processes, inflammatory processes, medications, and bone marrow disorders. Patient denies any recent or active infection. She denies any known inflammatory processes. Patient is currently not on any medication that can contribute to leukocytosis. She denies undergoing splenectomy. Patient reports that she was told she had iron deficiency anemia several years ago and briefly took iron tablets. However, she discontinued due to severe constipation. She denies knowing the cause for her prior iron deficiency anemia. Most recent colonoscopy was in December 2021 without any evidence of active or recent bleeding. I recommend proceeding with a thorough workup with labs to check CBC, CMP, ESR, CRP, Iron and TIBC, Ferritin, Retic Panel, MPN panel and BCR/ABL.   #Leukocytosis and thrombocytosis: --Unknown etiology --Labs today to check CBC, CMP, ESR, CRP, Iron and TIBC, Ferritin, Retic Panel, MPN panel and BCR/ABL.  --Consider bone marrow biopsy if above workup indicated bone marrow disorder.  --RTC pending above workup.   **Addendum: Initial labs from today show persistent leukocytosis and thrombocytosis. In addition, there is evidence of normocytic anemia. We will add folate and B12 levels to further assess.    #Dizziness: --Uncertain etiology. She denies any vision changes, headaches, gait abnormalities or syncopal episodes.   Patient notes that she was prescribed Farxiga and increased amlodipine dose to 7.5 mg by her PCP.  --I will reach out to Dr. Wynetta Emery to follow up with the patient.    Orders Placed This Encounter  Procedures  . CBC with Differential (Cancer Center Only)    Standing  Status:   Future    Number of Occurrences:   1    Standing Expiration Date:   11/18/2021  . JAK2 (INCLUDING V617F AND EXON 12), MPL,& CALR W/RFL MPN PANEL (NGS)    Standing Status:   Future  Number of Occurrences:   1    Standing Expiration Date:   11/18/2021  . BCR ABL1 FISH ([facility])    Standing Status:   Future    Number of Occurrences:   1    Standing Expiration Date:   11/18/2021  . Sedimentation rate    Standing Status:   Future    Number of Occurrences:   1    Standing Expiration Date:   11/18/2021  . C-reactive protein    Standing Status:   Future    Number of Occurrences:   1    Standing Expiration Date:   11/18/2021  . Iron and TIBC    Standing Status:   Future    Number of Occurrences:   1    Standing Expiration Date:   11/18/2021  . Ferritin    Standing Status:   Future    Number of Occurrences:   1    Standing Expiration Date:   11/18/2021  . Retic Panel    Standing Status:   Future    Number of Occurrences:   1    Standing Expiration Date:   11/18/2021  . Folate, Serum    Standing Status:   Future    Number of Occurrences:   1    Standing Expiration Date:   11/18/2021  . Vitamin B12    Standing Status:   Future    Number of Occurrences:   1    Standing Expiration Date:   11/18/2021  . Methylmalonic acid, serum    Standing Status:   Future    Standing Expiration Date:   11/18/2021  . Homocysteine, serum    Standing Status:   Future    Standing Expiration Date:   11/18/2021    All questions were answered. The patient knows to call the clinic with any problems, questions or concerns.  I have spent a total of 65 minutes minutes of face-to-face and non-face-to-face time, preparing to see the patient, obtaining and/or reviewing separately obtained history, performing a medically appropriate examination, counseling and educating the patient, ordering tests, documenting clinical information in the electronic health record, and care coordination.   Dede Query, PA-C Department of  Hematology/Oncology [place] at [facility] Phone: [phone]

## 2020-11-24 DIAGNOSIS — E113513 Type 2 diabetes mellitus with proliferative diabetic retinopathy with macular edema, bilateral: Secondary | ICD-10-CM | POA: Diagnosis not present

## 2020-11-25 ENCOUNTER — Telehealth: Payer: Self-pay | Admitting: Internal Medicine

## 2020-11-25 NOTE — Telephone Encounter (Signed)
Called patient using interpreter services advising patient I was calling from [facility] in regards to getting an appointment scheduled. Advised patient to call [phone] to schedule.   If patient returns phone call she can be scheduled with any available provider for dizziness or she can be referred to the mobile medicine unit.   Tomorrow (6/15) mobile unit will be located at [address] from 9-12:15 and 2-5:30pm.   Thursday (6/16) mobile will be located at [employer] from 9-12:15 and 2-5:30pm.   Additional days can be found at [email or web address]

## 2020-11-26 LAB — BCR ABL1 FISH (GENPATH)

## 2020-11-26 LAB — JAK2 (INCLUDING V617F AND EXON 12), MPL,& CALR W/RFL MPN PANEL (NGS)

## 2020-11-27 ENCOUNTER — Telehealth: Payer: Self-pay | Admitting: Physician Assistant

## 2020-11-27 DIAGNOSIS — D72829 Elevated white blood cell count, unspecified: Secondary | ICD-10-CM

## 2020-11-27 DIAGNOSIS — D75839 Thrombocytosis, unspecified: Secondary | ICD-10-CM

## 2020-11-27 DIAGNOSIS — D649 Anemia, unspecified: Secondary | ICD-10-CM

## 2020-11-27 NOTE — Telephone Encounter (Signed)
I called Ms. Chelsea Coleman using a spanish interpretor to review lab results from 11/18/2020. I explained that CBC revealed mild leukocytosis with WBC of 11.9, normocytic anemia with Hgb 10.8 and thrombocytosis with Plt of 478K. There was no evidence of B12, folate or iron deficiencies. There was elevated of inflammatory makers with sed rate 71 and CRP 1.7. 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 BCR/ABL was negative.   Patient denies any known inflammatory conditions but does report having joint pain involving the hips and lower extremities. The recommendation is a referral to rheumatology to evaluate for underlying inflammatory condition. Patient will return to our clinic in 3 months to monitor counts. We will consider a bone marrow biopsy if blood counts continue to worsen.   Patient expressed understanding and satisfaction with the plan provided.

## 2020-12-01 ENCOUNTER — Telehealth: Payer: Self-pay | Admitting: Internal Medicine

## 2020-12-01 ENCOUNTER — Other Ambulatory Visit: Payer: Self-pay | Admitting: Internal Medicine

## 2020-12-01 ENCOUNTER — Other Ambulatory Visit: Payer: Self-pay | Admitting: *Deleted

## 2020-12-01 ENCOUNTER — Other Ambulatory Visit: Payer: Self-pay

## 2020-12-01 DIAGNOSIS — L309 Dermatitis, unspecified: Secondary | ICD-10-CM

## 2020-12-01 MED ORDER — TRUEPLUS PEN NEEDLES 32G X 4 MM MISC
2 refills | Status: DC
Start: 2020-12-01 — End: 2023-04-13
  Filled 2020-12-01: qty 100, fill #0

## 2020-12-01 MED ORDER — ATORVASTATIN CALCIUM 20 MG PO TABS
ORAL_TABLET | Freq: Every day | ORAL | 3 refills | Status: DC
  Filled 2020-12-01: qty 90, 90d supply, fill #0
  Filled 2021-04-06: qty 90, 90d supply, fill #1

## 2020-12-01 MED ORDER — LOSARTAN POTASSIUM 50 MG PO TABS
ORAL_TABLET | Freq: Every day | ORAL | 1 refills | Status: DC
  Filled 2020-12-01 – 2021-01-30 (×4): qty 90, 90d supply, fill #0
  Filled 2021-05-04: qty 90, 90d supply, fill #1

## 2020-12-01 NOTE — Telephone Encounter (Signed)
Requested Prescriptions  Pending Prescriptions Disp Refills  . losartan (COZAAR) 50 MG tablet 90 tablet 1    Sig: TAKE 1 TABLET (50 MG TOTAL) BY MOUTH DAILY.     Cardiovascular:  Angiotensin Receptor Blockers Failed - 12/01/2020  2:25 PM      Failed - Cr in normal range and within 180 days    Creatinine, Ser  Date Value Ref Range Status  11/03/2020 1.43 (H) 0.57 - 1.00 mg/dL Final         Failed - Last BP in normal range    BP Readings from Last 1 Encounters:  11/18/20 (!) 146/95         Passed - K in normal range and within 180 days    Potassium  Date Value Ref Range Status  11/03/2020 4.9 3.5 - 5.2 mmol/L Final         Passed - Patient is not pregnant      Passed - Valid encounter within last 6 months    Recent Outpatient Visits          4 weeks ago Type 2 diabetes, controlled, with peripheral neuropathy ([place])   [place] Ladell Pier, MD   7 months ago Stage 3b chronic kidney disease [facility])   [place] Ladell Pier, MD   11 months ago Epigastric abdominal pain   [place], Deborah B, MD   1 year ago Type 2 diabetes, controlled, with peripheral neuropathy [facility])   [place] [facility] Ladell Pier, MD   1 year ago Pap smear for cervical cancer screening   [place] Ladell Pier, MD

## 2020-12-01 NOTE — Telephone Encounter (Signed)
Pt states she only has 4 Losartan but needs more of that for this week and the following items.    Rx #: [id number]  losartan (COZAAR) 50 MG tablet [[id number]   clobetasol (TEMOVATE) 0.05 % external solution [[id number]]  Rx #: [id number]  atorvastatin (LIPITOR) 20 MG tablet [[id number]]   Rx #: [id number]  Insulin Pen Needle (TRUEPLUS PEN NEEDLES) 32G X 4 MM [place] [facility] [place]  [address], [place] [address]  Phone:  [phone]  Fax:  [phone]   660]

## 2020-12-01 NOTE — Telephone Encounter (Signed)
Rx RF sent.  Route message to PCP for cream.

## 2020-12-02 ENCOUNTER — Other Ambulatory Visit: Payer: Self-pay

## 2020-12-02 MED ORDER — CLOBETASOL PROPIONATE 0.05 % EX SOLN
CUTANEOUS | 0 refills | Status: DC
  Filled 2020-12-02: qty 50, 30d supply, fill #0

## 2020-12-03 ENCOUNTER — Other Ambulatory Visit: Payer: Self-pay

## 2020-12-04 ENCOUNTER — Other Ambulatory Visit: Payer: Self-pay

## 2020-12-05 ENCOUNTER — Other Ambulatory Visit: Payer: Self-pay

## 2020-12-08 ENCOUNTER — Other Ambulatory Visit: Payer: Self-pay

## 2020-12-09 ENCOUNTER — Other Ambulatory Visit: Payer: Self-pay

## 2020-12-12 ENCOUNTER — Other Ambulatory Visit: Payer: Self-pay

## 2020-12-16 ENCOUNTER — Other Ambulatory Visit: Payer: Self-pay

## 2020-12-19 ENCOUNTER — Other Ambulatory Visit: Payer: Self-pay

## 2020-12-22 ENCOUNTER — Other Ambulatory Visit: Payer: Self-pay

## 2020-12-23 ENCOUNTER — Other Ambulatory Visit: Payer: Self-pay

## 2021-01-05 ENCOUNTER — Other Ambulatory Visit: Payer: Self-pay

## 2021-01-06 ENCOUNTER — Other Ambulatory Visit: Payer: Self-pay

## 2021-01-15 ENCOUNTER — Other Ambulatory Visit: Payer: Self-pay

## 2021-01-22 ENCOUNTER — Telehealth: Payer: Self-pay | Admitting: Internal Medicine

## 2021-01-22 NOTE — Telephone Encounter (Signed)
Copied from [place] [id number]. Topic: General - Call Back - No Documentation >> Jan 22, 2021  3:30 PM Erick Blinks wrote: Reason for CRM: Pt needs a call back from the nurses, pt has questions about her medication   Best contact: [phone]   Needs interpreter

## 2021-01-23 ENCOUNTER — Other Ambulatory Visit: Payer: Self-pay

## 2021-01-26 NOTE — Telephone Encounter (Signed)
Left message on voicemail to return call.  Due to language barrier, an interpreter was used.  Interpreter name Marinus Maw or [employer] ID [phone], Reason for encounter-question regarding medication

## 2021-01-30 ENCOUNTER — Other Ambulatory Visit: Payer: Self-pay

## 2021-01-30 ENCOUNTER — Other Ambulatory Visit: Payer: Self-pay | Admitting: Internal Medicine

## 2021-01-30 NOTE — Telephone Encounter (Signed)
Medication Refill - Medication:  losartan (COZAAR) 50 MG tablet   Has the patient contacted their pharmacy? No.  Preferred Pharmacy (with phone number or street name):  [place] [facility] [address], [place] [address]  Phone:  [phone]  Fax:  [phone]  Agent: Please be advised that RX refills may take up to 3 business days. We ask that you follow-up with your pharmacy.

## 2021-01-30 NOTE — Telephone Encounter (Signed)
Patient has script on file and they will get it ready

## 2021-02-02 ENCOUNTER — Other Ambulatory Visit: Payer: Self-pay

## 2021-02-04 ENCOUNTER — Other Ambulatory Visit: Payer: Self-pay

## 2021-02-09 ENCOUNTER — Telehealth: Payer: Self-pay | Admitting: Physician Assistant

## 2021-02-09 NOTE — Telephone Encounter (Signed)
Scheduled per sch msg. Called with interpreter. Left msg  

## 2021-02-23 ENCOUNTER — Encounter: Payer: Self-pay | Admitting: Pharmacist

## 2021-02-23 ENCOUNTER — Other Ambulatory Visit: Payer: Self-pay

## 2021-02-23 ENCOUNTER — Ambulatory Visit: Payer: Medicare Other | Attending: Internal Medicine | Admitting: Pharmacist

## 2021-02-23 VITALS — BP 142/90 | HR 98 | Temp 97.8°F | Ht 63.0 in | Wt 206.0 lb

## 2021-02-23 DIAGNOSIS — Z0000 Encounter for general adult medical examination without abnormal findings: Secondary | ICD-10-CM

## 2021-02-23 NOTE — Progress Notes (Signed)
Subjective:   Chelsea Coleman is a 65 y.o. female who presents for Medicare Annual (Subsequent) preventive examination.  Review of Systems    Cardiac Risk Factors include: advanced age (>40mn, >>2women);diabetes mellitus;dyslipidemia;hypertension;obesity (BMI >30kg/m2);sedentary lifestyle     Objective:    Today's Vitals   02/23/21 1454 02/23/21 1457  BP: (!) 142/90   Pulse: 98   Temp: 97.8 F (36.6 C)   SpO2: 98%   Weight: 206 lb (93.4 kg)   Height: 5' 3" (1.6 m)   PainSc: 0-No pain 0-No pain   Body mass index is 36.49 kg/m.  Advanced Directives 02/23/2021 11/18/2020 02/04/2020  Does Patient Have a Medical Advance Directive? No No No  Would patient like information on creating a medical advance directive? No - Patient declined No - Patient declined Yes (Inpatient - patient defers creating a medical advance directive at this time - Information given)    Current Medications (verified) Outpatient Encounter Medications as of 02/23/2021  Medication Sig   amLODipine (NORVASC) 5 MG tablet Take 1.5 tablets (7.5 mg total) by mouth daily.   atorvastatin (LIPITOR) 20 MG tablet TAKE 1 TABLET (20 MG TOTAL) BY MOUTH DAILY.   clobetasol (TEMOVATE) 0.05 % external solution APPLY SMALL AMOUNT TO AFFECTED AREA 3 TIMES A WEEK AS NEEDED.   dapagliflozin propanediol (FARXIGA) 5 MG TABS tablet Take 1 tablet (5 mg total) by mouth daily before breakfast.   gabapentin (NEURONTIN) 400 MG capsule TAKE 1 CAPSULE (400 MG TOTAL) BY MOUTH 2 (TWO) TIMES DAILY.   insulin NPH-regular Human (70-30) 100 UNIT/ML injection Inj 20 units in a.m with breakfast and 10 units with dinner   Insulin Pen Needle (TRUEPLUS PEN NEEDLES) 32G X 4 MM MISC Use to inject insulin twice daily.   losartan (COZAAR) 50 MG tablet TAKE 1 TABLET (50 MG TOTAL) BY MOUTH DAILY.   omeprazole (PRILOSEC) 20 MG capsule TAKE 1 CAPSULE (20 MG TOTAL) BY MOUTH DAILY.   OVER THE COUNTER MEDICATION Take 1 capsule by mouth at bedtime.    Accu-Chek FastClix Lancets MISC Use to check blood sugar 3 TIMES DAILY. (Patient taking differently: Use to check blood sugar 3 TIMES DAILY.)   Blood Glucose Monitoring Suppl (ACCU-CHEK GUIDE ME) w/Device KIT Use to check blood sugar 3 TIMES DAILY. (Patient taking differently: Use to check blood sugar 3 TIMES DAILY.)   glucose blood (ACCU-CHEK GUIDE) test strip Use to check blood sugar 3 TIMES DAILY. (Patient taking differently: Use to check blood sugar 3 TIMES DAILY.)   [DISCONTINUED] docusate sodium (COLACE) 100 MG capsule Take 1 capsule (100 mg total) by mouth 2 (two) times daily.   No facility-administered encounter medications on file as of 02/23/2021.    Allergies (verified) Patient has no known allergies.   History: Past Medical History:  Diagnosis Date   Cataract    states removed in 2014   Chronic kidney disease 02/14/2020   Stage 3    Diabetes mellitus without complication (HCC)    GERD (gastroesophageal reflux disease)    Hypertension    Past Surgical History:  Procedure Laterality Date   ABDOMINAL HYSTERECTOMY     fibroid   CHOLECYSTECTOMY     REFRACTIVE SURGERY  2017   BL   Family History  Problem Relation Age of Onset   Diabetes Mother    Colon cancer Neg Hx    Colon polyps Neg Hx    Esophageal cancer Neg Hx    Rectal cancer Neg Hx  Stomach cancer Neg Hx    Social History   Socioeconomic History   Marital status: Married    Spouse name: Not on file   Number of children: 1   Years of education: 12 grade   Highest education level: Not on file  Occupational History   Occupation: unemployed  Tobacco Use   Smoking status: Never   Smokeless tobacco: Never  Vaping Use   Vaping Use: Never used  Substance and Sexual Activity   Alcohol use: Never   Drug use: Never   Sexual activity: Not on file  Other Topics Concern   Not on file  Social History Narrative   Not on file   Social Determinants of Health   Financial Resource Strain: Not on file   Food Insecurity: Not on file  Transportation Needs: Not on file  Physical Activity: Not on file  Stress: Not on file  Social Connections: Not on file    Tobacco Counseling Counseling given: No   Clinical Intake:  Pre-visit preparation completed: No  Pain : No/denies pain Pain Score: 0-No pain     Nutritional Status: BMI > 30  Obese Diabetes: Yes CBG done?: No Did pt. bring in CBG monitor from home?: No  How often do you need to have someone help you when you read instructions, pamphlets, or other written materials from your doctor or pharmacy?: 2 - Rarely  Diabetic? Yes  Interpreter Needed?: Yes Interpreter Agency: [employer] Patient Declined Interpreter : No Patient signed [place] waiver: No  Information entered by :: [place]   Activities of Daily Living In your present state of health, do you have any difficulty performing the following activities: 02/23/2021  Hearing? N  Vision? N  Difficulty concentrating or making decisions? N  Walking or climbing stairs? N  Dressing or bathing? N  Doing errands, shopping? N  Preparing Food and eating ? N  Using the Toilet? N  In the past six months, have you accidently leaked urine? N  Do you have problems with loss of bowel control? N  Managing your Medications? N  Managing your Finances? N  Housekeeping or managing your Housekeeping? N  Some recent data might be hidden    Patient Care Team: Ladell Pier, MD as PCP - General (Internal Medicine)  Indicate any recent Medical Services you may have received from other than [facility] providers in the past year (date may be approximate).     Assessment:   This is a routine wellness examination for Chelsea Coleman.  Hearing/Vision screen No results found.  Dietary issues and exercise activities discussed: Current Exercise Habits: The patient does not participate in regular exercise at present, Exercise limited by: None identified   Goals Addressed   None   Depression  Screen PHQ 2/9 Scores 02/23/2021 04/28/2020 02/04/2020 12/24/2019 09/27/2019 01/26/2019  PHQ - 2 Score 0 0 0 0 0 6  PHQ- 9 Score - - - - - 27    Fall Risk Fall Risk  02/23/2021 11/03/2020 04/28/2020 02/04/2020 12/24/2019  Falls in the past year? 0 1 0 0 0  Number falls in past yr: 0 0 0 0 0  Injury with Fall? 0 1 0 0 1  Follow up Falls evaluation completed;Education provided;Falls prevention discussed Education provided - - -    FALL RISK PREVENTION PERTAINING TO THE HOME:  Any stairs in or around the home? No  If so, are there any without handrails? No  Home free of loose throw rugs in walkways, pet beds,  electrical cords, etc? Yes  Adequate lighting in your home to reduce risk of falls? Yes   ASSISTIVE DEVICES UTILIZED TO PREVENT FALLS:  Life alert? No  Use of a cane, walker or w/c? Yes  Grab bars in the bathroom? No  Shower chair or bench in shower? No  Elevated toilet seat or a handicapped toilet? No   TIMED UP AND GO:  Was the test performed? Yes .  Length of time to ambulate 10 feet: 5 sec.   Gait steady and fast with assistive device  Cognitive Function: MMSE - Mini Mental State Exam 02/23/2021 02/04/2020  Orientation to time 5 5  Orientation to Place 5 5  Registration 3 3  Attention/ Calculation 5 5  Recall 3 3  Language- name 2 objects 2 2  Language- repeat 1 1  Language- follow 3 step command 3 3  Language- read & follow direction 1 1  Write a sentence 1 1  Copy design 1 0  Total score 30 29        Immunizations Immunization History  Administered Date(s) Administered   Influenza,inj,Quad PF,6+ Mos 04/06/2019, 02/04/2020   PFIZER(Purple Top)SARS-COV-2 Vaccination 09/02/2019, 09/23/2019   Pneumococcal Conjugate-13 12/24/2019   Tdap 04/06/2019    TDAP status: Up to date  Flu Vaccine status: Declined, Education has been provided regarding the importance of this vaccine but patient still declined. Advised may receive this vaccine at local pharmacy or Health  Dept. Aware to provide a copy of the vaccination record if obtained from local pharmacy or Health Dept. Verbalized acceptance and understanding.  Pneumococcal vaccine status: Declined,  Education has been provided regarding the importance of this vaccine but patient still declined. Advised may receive this vaccine at local pharmacy or Health Dept. Aware to provide a copy of the vaccination record if obtained from local pharmacy or Health Dept. Verbalized acceptance and understanding.   Covid-19 vaccine status: Information provided on how to obtain vaccines.   Qualifies for Shingles Vaccine? Yes   Zostavax completed No   Shingrix Completed?: No.    Education has been provided regarding the importance of this vaccine. Patient has been advised to call insurance company to determine out of pocket expense if they have not yet received this vaccine. Advised may also receive vaccine at local pharmacy or Health Dept. Verbalized acceptance and understanding.  Screening Tests Health Maintenance  Topic Date Due   FOOT EXAM  Never done   DEXA SCAN  Never done   OPHTHALMOLOGY EXAM  05/31/2020   COVID-19 Vaccine (3 - Booster for Pfizer series) 03/11/2021 (Originally 02/23/2020)   Zoster Vaccines- Shingrix (1 of 2) 05/25/2021 (Originally 10/15/2004)   INFLUENZA VACCINE  09/11/2021 (Originally 01/12/2021)   PNA vac Low Risk Adult (2 of 2 - PPSV23) 02/23/2022 (Originally 12/23/2020)   HEMOGLOBIN A1C  05/06/2021   MAMMOGRAM  07/16/2021   COLONOSCOPY (Pts 45-77yr Insurance coverage will need to be confirmed)  05/26/2025   TETANUS/TDAP  04/05/2029   Hepatitis C Screening  Completed   HPV VACCINES  Aged Out    Health Maintenance  Health Maintenance Due  Topic Date Due   FOOT EXAM  Never done   DEXA SCAN  Never done   OPHTHALMOLOGY EXAM  05/31/2020    Colorectal cancer screening: Type of screening: Colonoscopy. Completed 05/26/20. Repeat every 5 years  Mammogram status: Completed 07/17/2019. Repeat every  year  Bone Density status: Ordered - scheduledn 04/20/2021. Pt provided with contact info and advised to call to schedule appt.  Lung Cancer Screening: (Low Dose CT Chest recommended if Age 9-80 years, 30 pack-year currently smoking OR have quit w/in 15years.) does not qualify.   Lung Cancer Screening Referral: None needed   Additional Screening:  Hepatitis C Screening: does qualify; Completed 02/04/2020  Vision Screening: Recommended annual ophthalmology exams for early detection of glaucoma and other disorders of the eye. Is the patient up to date with their annual eye exam?  Yes  Who is the provider or what is the name of the office in which the patient attends annual eye exams? Pt could not verbalize the name of the provider  Dental Screening: Recommended annual dental exams for proper oral hygiene  Community Resource Referral / Chronic Care Management: CRR required this visit?  No   CCM required this visit?  No      Plan:     I have personally reviewed and noted the following in the patient's chart:   Medical and social history Use of alcohol, tobacco or illicit drugs  Current medications and supplements including opioid prescriptions.  Functional ability and status Nutritional status Physical activity Advanced directives List of other physicians Hospitalizations, surgeries, and ER visits in previous 12 months Vitals Screenings to include cognitive, depression, and falls Referrals and appointments  In addition, I have reviewed and discussed with patient certain preventive protocols, quality metrics, and best practice recommendations. A written personalized care plan for preventive services as well as general preventive health recommendations were provided to patient.     Tresa Endo, RPH-CPP   02/23/2021

## 2021-02-27 ENCOUNTER — Inpatient Hospital Stay: Payer: Medicare Other

## 2021-02-27 ENCOUNTER — Inpatient Hospital Stay: Payer: Medicare Other | Admitting: Physician Assistant

## 2021-03-02 ENCOUNTER — Other Ambulatory Visit: Payer: Self-pay

## 2021-03-02 ENCOUNTER — Ambulatory Visit: Payer: Medicare Other | Admitting: Internal Medicine

## 2021-03-03 ENCOUNTER — Other Ambulatory Visit: Payer: Self-pay

## 2021-03-05 ENCOUNTER — Other Ambulatory Visit: Payer: Self-pay

## 2021-03-08 ENCOUNTER — Other Ambulatory Visit: Payer: Self-pay | Admitting: Hematology and Oncology

## 2021-03-08 DIAGNOSIS — D75839 Thrombocytosis, unspecified: Secondary | ICD-10-CM

## 2021-03-08 NOTE — Progress Notes (Signed)
Chelsea [phone]   [phone]  PROGRESS NOTE  Patient Care Team: Ladell Pier, MD as PCP - General (Internal Medicine)  Hematological/Oncological History # Thrombocytosis/Leukocytosis 1) Labs from PCP, Dr. Karle Plumber: -01/26/2019: WBC 10.0, Hgb 10.4 (L), MCV 90, Plt 553 (H), Iron 50, TIBC 228 (L), Ferritin 573 (H), Iron Saturation 22, Folate 11 -10/01/2019: WBC 9.6, Hgb 11.1, MCV 92, Plt 487 (H) -11/03/2020: WBC 11.7 (H), Hgb 11.5, MCV 90, Plt 541 (H)  2) 11/18/2020: Establish care with Dede Query PA-C  Interval History:  Chelsea Coleman y.o. female with medical history significant for leukocytosis/thrombocytosis who presents for a follow up visit. The patient's last visit was on 11/18/2020 at which time she established care. In the interim since the last visit she had genetic testing return showing a P53 mutation.   On exam today Chelsea Coleman reports she has been well in the interim since our last visit. She has had no changes in her health. She notes her energy levels are good and she is not having any shortness of breath, chest pain, or new symptoms. She denies any bleeding or bruising. She denies any fever, chills, sweats, nausea, or vomiting. A full 10 point ROS is listed below.  Today we discussed the results of her prior bloodwork and genetic testing. The details of this conversation are noted below.  MEDICAL HISTORY:  Past Medical History:  Diagnosis Date   Cataract    states removed in 2014   Chronic kidney disease 02/14/2020   Stage 3    Diabetes mellitus without complication (HCC)    GERD (gastroesophageal reflux disease)    Hypertension     SURGICAL HISTORY: Past Surgical History:  Procedure Laterality Date   ABDOMINAL HYSTERECTOMY     fibroid   CHOLECYSTECTOMY     REFRACTIVE SURGERY  2017   BL    SOCIAL HISTORY: Social History   Socioeconomic History   Marital status: Married    Spouse  name: Not on file   Number of children: 1   Years of education: 12 grade   Highest education level: Not on file  Occupational History   Occupation: unemployed  Tobacco Use   Smoking status: Never   Smokeless tobacco: Never  Vaping Use   Vaping Use: Never used  Substance and Sexual Activity   Alcohol use: Never   Drug use: Never   Sexual activity: Not on file  Other Topics Concern   Not on file  Social History Narrative   Not on file   Social Determinants of Health   Financial Resource Strain: Not on file  Food Insecurity: Not on file  Transportation Needs: Not on file  Physical Activity: Not on file  Stress: Not on file  Social Connections: Not on file  Intimate Partner Violence: Not on file    FAMILY HISTORY: Family History  Problem Relation Age of Onset   Diabetes Mother    Colon cancer Neg Hx    Colon polyps Neg Hx    Esophageal cancer Neg Hx    Rectal cancer Neg Hx    Stomach cancer Neg Hx     ALLERGIES:  has No Known Allergies.  MEDICATIONS:  Current Outpatient Medications  Medication Sig Dispense Refill   Accu-Chek FastClix Lancets MISC Use to check blood sugar 3 TIMES DAILY. (Patient taking differently: Use to check blood sugar 3 TIMES DAILY.) 100 each 2   amLODipine (NORVASC) 5 MG tablet Take 1.5 tablets (  7.5 mg total) by mouth daily. 135 tablet 3   atorvastatin (LIPITOR) 20 MG tablet TAKE 1 TABLET (20 MG TOTAL) BY MOUTH DAILY. 90 tablet 3   Blood Glucose Monitoring Suppl (ACCU-CHEK GUIDE ME) w/Device KIT Use to check blood sugar 3 TIMES DAILY. (Patient taking differently: Use to check blood sugar 3 TIMES DAILY.) 1 kit 0   clobetasol (TEMOVATE) 0.05 % external solution APPLY SMALL AMOUNT TO AFFECTED AREA 3 TIMES A WEEK AS NEEDED. 50 mL 0   dapagliflozin propanediol (FARXIGA) 5 MG TABS tablet Take 1 tablet (5 mg total) by mouth daily before breakfast. 30 tablet 6   gabapentin (NEURONTIN) 400 MG capsule TAKE 1 CAPSULE (400 MG TOTAL) BY MOUTH 2 (TWO) TIMES  DAILY. 60 capsule 5   glucose blood (ACCU-CHEK GUIDE) test strip Use to check blood sugar 3 TIMES DAILY. (Patient taking differently: Use to check blood sugar 3 TIMES DAILY.) 100 each 2   insulin NPH-regular Human (70-30) 100 UNIT/ML injection Inj 20 units in a.m with breakfast and 10 units with dinner 10 mL 6   Insulin Pen Needle (TRUEPLUS PEN NEEDLES) 32G X 4 MM MISC Use to inject insulin twice daily. 100 each 2   losartan (COZAAR) 50 MG tablet TAKE 1 TABLET (50 MG TOTAL) BY MOUTH DAILY. 90 tablet 1   omeprazole (PRILOSEC) 20 MG capsule TAKE 1 CAPSULE (20 MG TOTAL) BY MOUTH DAILY. 30 capsule 3   OVER THE COUNTER MEDICATION Take 1 capsule by mouth at bedtime.     No current facility-administered medications for this visit.    REVIEW OF SYSTEMS:   Constitutional: ( - ) fevers, ( - )  chills , ( - ) night sweats Eyes: ( - ) blurriness of vision, ( - ) double vision, ( - ) watery eyes Ears, nose, mouth, throat, and face: ( - ) mucositis, ( - ) sore throat Respiratory: ( - ) cough, ( - ) dyspnea, ( - ) wheezes Cardiovascular: ( - ) palpitation, ( - ) chest discomfort, ( - ) lower extremity swelling Gastrointestinal:  ( - ) nausea, ( - ) heartburn, ( - ) change in bowel habits Skin: ( - ) abnormal skin rashes Lymphatics: ( - ) new lymphadenopathy, ( - ) easy bruising Neurological: ( - ) numbness, ( - ) tingling, ( - ) new weaknesses Behavioral/Psych: ( - ) mood change, ( - ) new changes  All other systems were reviewed with the patient and are negative.  PHYSICAL EXAMINATION:  Vitals:   03/09/21 1056  BP: (!) 141/63  Pulse: 78  Resp: 18  Temp: (!) 96.3 F (35.7 C)  SpO2: 95%   Filed Weights   03/09/21 1056  Weight: 203 lb 6.4 oz (92.3 kg)    GENERAL: well appearing elderly [place] female, alert, no distress and comfortable SKIN: skin color, texture, turgor are normal, no rashes or significant lesions EYES: conjunctiva are pink and non-injected, sclera clear LUNGS: clear  to auscultation and percussion with normal breathing effort HEART: regular rate & rhythm and no murmurs and no lower extremity edema Musculoskeletal: no cyanosis of digits and no clubbing  PSYCH: alert & oriented x 3, fluent speech NEURO: no focal motor/sensory deficits  LABORATORY DATA:  I have reviewed the data as listed CBC Latest Ref Rng & Units 03/09/2021 11/18/2020 11/03/2020  WBC 4.0 - 10.5 K/uL 12.3(H) 11.9(H) 11.7(H)  Hemoglobin 12.0 - 15.0 g/dL 11.4(L) 10.8(L) 11.5  Hematocrit 36.0 - 46.0 % 34.8(L) 32.3(L) 35.2  Platelets  150 - 400 K/uL 495(H) 478(H) 541(H)    CMP Latest Ref Rng & Units 03/09/2021 11/03/2020 12/24/2019  Glucose 70 - 99 mg/dL 152(H) 166(H) 92  BUN 8 - 23 mg/dL 23 21 37(H)  Creatinine 0.44 - 1.00 mg/dL 1.56(H) 1.43(H) 1.85(H)  Sodium 135 - 145 mmol/L 137 131(L) 131(L)  Potassium 3.5 - 5.1 mmol/L 4.1 4.9 4.8  Chloride 98 - 111 mmol/L 104 92(L) 96  CO2 22 - 32 mmol/L 25 25 25   Calcium 8.9 - 10.3 mg/dL 9.1 9.4 8.9  Total Protein 6.5 - 8.1 g/dL 7.9 7.5 -  Total Bilirubin 0.3 - 1.2 mg/dL 0.3 0.3 -  Alkaline Phos 38 - 126 U/L 102 119 -  AST 15 - 41 U/L 14(L) 11 -  ALT 0 - 44 U/L 8 9 -    RADIOGRAPHIC STUDIES: I have personally reviewed the radiological images as listed and agreed with the findings in the report. No results found.  ASSESSMENT & PLAN Chelsea Coleman Coleman y.o. female with medical history significant for leukocytosis/thrombocytosis who presents for a follow up visit.   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  This is a mutation of unclear significance and may be the likely driver of her leukocytosis and thrombocytosis.  The patient may still have a myeloproliferative neoplasm, though bone marrow biopsy is not strictly indicated in this situation.  Observation would be appropriate and if there were to be any change in her clinical status we could pursue a bone marrow biopsy.  As for now I do believe a  pattern of observation would be most appropriate given the unclear significance of this mutation.  #Leukocytosis/Thrombocytosis #Mutation of Unclear Significance -- Recommend serial monitoring of CBCs.  We will plan to see the patient back in 6 months time. --Consider bone marrow biopsy the patient were to have new or worsening changes in her blood counts --Return to clinic in 6 months time with repeat labs.  No orders of the defined types were placed in this encounter.   All questions were answered. The patient knows to call the clinic with any problems, questions or concerns.  A total of more than 30 minutes were spent on this encounter with face-to-face time and non-face-to-face time, including preparing to see the patient, ordering tests and/or medications, counseling the patient and coordination of care as outlined above.   Ledell Peoples, MD Department of Hematology/Oncology [place] [facility] Phone: [phone] Pager: [phone] Email: [email or web address]  03/10/2021 11:50 AM

## 2021-03-09 ENCOUNTER — Other Ambulatory Visit: Payer: Self-pay

## 2021-03-09 ENCOUNTER — Inpatient Hospital Stay: Payer: Medicare Other | Admitting: Hematology and Oncology

## 2021-03-09 ENCOUNTER — Inpatient Hospital Stay: Payer: Medicare Other | Attending: Hematology and Oncology

## 2021-03-09 VITALS — BP 141/63 | HR 78 | Temp 96.3°F | Resp 18 | Wt 203.4 lb

## 2021-03-09 DIAGNOSIS — Z794 Long term (current) use of insulin: Secondary | ICD-10-CM | POA: Diagnosis not present

## 2021-03-09 DIAGNOSIS — Z833 Family history of diabetes mellitus: Secondary | ICD-10-CM | POA: Diagnosis not present

## 2021-03-09 DIAGNOSIS — D75839 Thrombocytosis, unspecified: Secondary | ICD-10-CM

## 2021-03-09 DIAGNOSIS — E1122 Type 2 diabetes mellitus with diabetic chronic kidney disease: Secondary | ICD-10-CM | POA: Diagnosis not present

## 2021-03-09 DIAGNOSIS — I129 Hypertensive chronic kidney disease with stage 1 through stage 4 chronic kidney disease, or unspecified chronic kidney disease: Secondary | ICD-10-CM | POA: Diagnosis not present

## 2021-03-09 DIAGNOSIS — D72829 Elevated white blood cell count, unspecified: Secondary | ICD-10-CM | POA: Diagnosis not present

## 2021-03-09 DIAGNOSIS — N189 Chronic kidney disease, unspecified: Secondary | ICD-10-CM | POA: Diagnosis not present

## 2021-03-09 DIAGNOSIS — Z79899 Other long term (current) drug therapy: Secondary | ICD-10-CM | POA: Insufficient documentation

## 2021-03-09 DIAGNOSIS — D649 Anemia, unspecified: Secondary | ICD-10-CM

## 2021-03-09 DIAGNOSIS — Z1509 Genetic susceptibility to other malignant neoplasm: Secondary | ICD-10-CM | POA: Insufficient documentation

## 2021-03-09 LAB — CMP (CANCER CENTER ONLY)
ALT: 8 U/L (ref 0–44)
AST: 14 U/L — ABNORMAL LOW (ref 15–41)
Albumin: 3.4 g/dL — ABNORMAL LOW (ref 3.5–5.0)
Alkaline Phosphatase: 102 U/L (ref 38–126)
Anion gap: 8 (ref 5–15)
BUN: 23 mg/dL (ref 8–23)
CO2: 25 mmol/L (ref 22–32)
Calcium: 9.1 mg/dL (ref 8.9–10.3)
Chloride: 104 mmol/L (ref 98–111)
Creatinine: 1.56 mg/dL — ABNORMAL HIGH (ref 0.44–1.00)
GFR, Estimated: 36 mL/min — ABNORMAL LOW (ref >60)
Glucose, Bld: 152 mg/dL — ABNORMAL HIGH (ref 70–99)
Potassium: 4.1 mmol/L (ref 3.5–5.1)
Sodium: 137 mmol/L (ref 135–145)
Total Bilirubin: 0.3 mg/dL (ref 0.3–1.2)
Total Protein: 7.9 g/dL (ref 6.5–8.1)

## 2021-03-09 LAB — CBC WITH DIFFERENTIAL (CANCER CENTER ONLY)
Abs Immature Granulocytes: 0.04 10*3/uL (ref 0.00–0.07)
Basophils Absolute: 0 10*3/uL (ref 0.0–0.1)
Basophils Relative: 0 %
Eosinophils Absolute: 0.1 10*3/uL (ref 0.0–0.5)
Eosinophils Relative: 1 %
HCT: 34.8 % — ABNORMAL LOW (ref 36.0–46.0)
Hemoglobin: 11.4 g/dL — ABNORMAL LOW (ref 12.0–15.0)
Immature Granulocytes: 0 %
Lymphocytes Relative: 20 %
Lymphs Abs: 2.4 10*3/uL (ref 0.7–4.0)
MCH: 29.5 pg (ref 26.0–34.0)
MCHC: 32.8 g/dL (ref 30.0–36.0)
MCV: 89.9 fL (ref 80.0–100.0)
Monocytes Absolute: 0.5 10*3/uL (ref 0.1–1.0)
Monocytes Relative: 4 %
Neutro Abs: 9.2 10*3/uL — ABNORMAL HIGH (ref 1.7–7.7)
Neutrophils Relative %: 75 %
Platelet Count: 495 10*3/uL — ABNORMAL HIGH (ref 150–400)
RBC: 3.87 MIL/uL (ref 3.87–5.11)
RDW: 13.3 % (ref 11.5–15.5)
WBC Count: 12.3 10*3/uL — ABNORMAL HIGH (ref 4.0–10.5)
nRBC: 0 % (ref 0.0–0.2)

## 2021-03-10 ENCOUNTER — Other Ambulatory Visit: Payer: Self-pay

## 2021-03-10 LAB — HOMOCYSTEINE: Homocysteine: 21.7 umol/L — ABNORMAL HIGH (ref 0.0–17.2)

## 2021-03-14 LAB — METHYLMALONIC ACID, SERUM: Methylmalonic Acid, Quantitative: 302 nmol/L (ref 0–378)

## 2021-03-30 ENCOUNTER — Ambulatory Visit: Payer: Medicare Other | Attending: Internal Medicine | Admitting: Internal Medicine

## 2021-03-30 ENCOUNTER — Other Ambulatory Visit: Payer: Self-pay | Admitting: Pharmacist

## 2021-03-30 ENCOUNTER — Other Ambulatory Visit: Payer: Self-pay

## 2021-03-30 ENCOUNTER — Encounter: Payer: Self-pay | Admitting: Internal Medicine

## 2021-03-30 DIAGNOSIS — L309 Dermatitis, unspecified: Secondary | ICD-10-CM | POA: Diagnosis not present

## 2021-03-30 DIAGNOSIS — E785 Hyperlipidemia, unspecified: Secondary | ICD-10-CM

## 2021-03-30 DIAGNOSIS — E1142 Type 2 diabetes mellitus with diabetic polyneuropathy: Secondary | ICD-10-CM | POA: Diagnosis not present

## 2021-03-30 DIAGNOSIS — Z23 Encounter for immunization: Secondary | ICD-10-CM

## 2021-03-30 DIAGNOSIS — I152 Hypertension secondary to endocrine disorders: Secondary | ICD-10-CM

## 2021-03-30 DIAGNOSIS — D72829 Elevated white blood cell count, unspecified: Secondary | ICD-10-CM | POA: Diagnosis not present

## 2021-03-30 DIAGNOSIS — E1159 Type 2 diabetes mellitus with other circulatory complications: Secondary | ICD-10-CM

## 2021-03-30 DIAGNOSIS — N1832 Chronic kidney disease, stage 3b: Secondary | ICD-10-CM | POA: Diagnosis not present

## 2021-03-30 DIAGNOSIS — E1169 Type 2 diabetes mellitus with other specified complication: Secondary | ICD-10-CM

## 2021-03-30 DIAGNOSIS — Z6836 Body mass index (BMI) 36.0-36.9, adult: Secondary | ICD-10-CM

## 2021-03-30 LAB — POCT GLYCOSYLATED HEMOGLOBIN (HGB A1C): HbA1c, POC (controlled diabetic range): 7.4 % — AB (ref 0.0–7.0)

## 2021-03-30 LAB — GLUCOSE, POCT (MANUAL RESULT ENTRY): POC Glucose: 134 mg/dl — AB (ref 70–99)

## 2021-03-30 MED ORDER — CLOBETASOL PROPIONATE 0.05 % EX SOLN
CUTANEOUS | 1 refills | Status: DC
  Filled 2021-03-30: qty 50, 30d supply, fill #0

## 2021-03-30 MED ORDER — "INSULIN SYRINGE-NEEDLE U-100 31G X 5/16"" 0.3 ML MISC"
2 refills | Status: DC
  Filled 2021-03-30: qty 100, 50d supply, fill #0

## 2021-03-30 NOTE — Progress Notes (Signed)
Patient ID: Chelsea Coleman, female    DOB: [birth year]  MRN: [id number]  CC: Diabetes   Subjective: Chelsea Coleman is a 65 y.o. female who presents for chronic ds management Her concerns today include:  Pt with hx of DM neuropathy and retinopathy, HTN, HL, obesity, anemia of chronic ds, thrombocytosis, CKD 3b, ACD, Vit D.  DIABETES TYPE 2 Last A1C:   Results for orders placed or performed in visit on 03/30/21  POCT glucose (manual entry)  Result Value Ref Range   POC Glucose 134 (A) 70 - 99 mg/dl  POCT glycosylated hemoglobin (Hb A1C)  Result Value Ref Range   Hemoglobin A1C     HbA1c POC (<> result, manual entry)     HbA1c, POC (prediabetic range)     HbA1c, POC (controlled diabetic range) 7.4 (A) 0.0 - 7.0 %   A1C is improved from 8.4 on last visit Med Adherence:  [x]  Yes -NPH/Reg 20 units in a.m/10 p.m and Farxiga. Farxiga added on last visit.  She tolerates   []  No Medication side effects:  []  Yes    []  No Home Monitoring?  []  Yes    []  No Home glucose results range: Diet Adherence: reports decrease appetite.  Loss on 3 lbs since last visit Exercise: []  Yes    [x]  No due to neuropathy. Gabapentin helps Hypoglycemic episodes?: []  Yes    []  No Numbness of the feet? []  Yes    [x]  No Retinopathy hx? [x]  Yes    []  No Last eye exam:  due for eye exam Comments:   HYPERTENSION/CKD3 Currently taking: see medication list.  She is on amlodipine, Cozaar Med Adherence: [x]  Yes    []  No Medication side effects: []  Yes    [x]  No Adherence with salt restriction: [x]  Yes    []  No Home Monitoring?: []  Yes    []  No Monitoring Frequency:  Home BP results range:  SOB? []  Yes    [x]  No Chest Pain?: []  Yes    [x]  No Leg swelling?: []  Yes    [x]  No Headaches?: []  Yes    [x]  No Dizziness? []  Yes    [x]  No Comments: GFR since [place] went from 40 to 36.  Creat went from 1.43 to 1.56.  Drinking adequate water.  Has lab tests with Dr. Royce Macadamia today and subsequent visit with  her in a few weeks.  HL:  taking and tolerating Lipitor  Leukocytosis/thrombocytosis: She saw the hematologist Dr. Lorenso Courier for this since last visit.  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  He plans to see her back in 6 months for continued monitoring of CBC.  If any worsening he plans to do bone marrow bx  Requests refill on clobetasol solution which she uses on her scalp.  Also complains of dryness on her face mainly around the cheeks and the lower jaw.  She has been using various moisturizing creams without improvement.   HM:  had 3 COVID booster shot.  Due for flu shot Patient Active Problem List   Diagnosis Date Noted   Leukocytosis 11/18/2020   Thrombocytosis 11/18/2020   Hypertension associated with stage 3 chronic kidney disease due to type 2 diabetes mellitus ([place]) 11/03/2020   Obesity (BMI 35.0-39.9 without comorbidity) 11/03/2020   Stage 3b chronic kidney disease ([place]) 04/06/2019   Hyperlipidemia associated with type 2 diabetes mellitus ([place]) 04/06/2019   Gastroesophageal reflux disease without esophagitis 04/06/2019   Type 2  diabetes, controlled, with peripheral neuropathy ([place]) 01/26/2019   Diabetic retinopathy associated with controlled type 2 diabetes mellitus ([place]) 01/26/2019   Essential hypertension 01/26/2019   Anemia 01/26/2019   Drug-induced constipation 01/26/2019   Memory difficulties 01/26/2019   Positive depression screening 01/26/2019     Current Outpatient Medications on File Prior to Visit  Medication Sig Dispense Refill   Accu-Chek FastClix Lancets MISC Use to check blood sugar 3 TIMES DAILY. (Patient taking differently: Use to check blood sugar 3 TIMES DAILY.) 100 each 2   amLODipine (NORVASC) 5 MG tablet Take 1.5 tablets (7.5 mg total) by mouth daily. 135 tablet 3   atorvastatin (LIPITOR) 20 MG tablet TAKE 1 TABLET (20 MG TOTAL) BY MOUTH DAILY. 90 tablet 3   Blood Glucose Monitoring Suppl (ACCU-CHEK GUIDE ME) w/Device KIT Use  to check blood sugar 3 TIMES DAILY. (Patient taking differently: Use to check blood sugar 3 TIMES DAILY.) 1 kit 0   clobetasol (TEMOVATE) 0.05 % external solution APPLY SMALL AMOUNT TO AFFECTED AREA 3 TIMES A WEEK AS NEEDED. 50 mL 0   dapagliflozin propanediol (FARXIGA) 5 MG TABS tablet Take 1 tablet (5 mg total) by mouth daily before breakfast. 30 tablet 6   gabapentin (NEURONTIN) 400 MG capsule TAKE 1 CAPSULE (400 MG TOTAL) BY MOUTH 2 (TWO) TIMES DAILY. 60 capsule 5   glucose blood (ACCU-CHEK GUIDE) test strip Use to check blood sugar 3 TIMES DAILY. (Patient taking differently: Use to check blood sugar 3 TIMES DAILY.) 100 each 2   insulin NPH-regular Human (70-30) 100 UNIT/ML injection Inj 20 units in a.m with breakfast and 10 units with dinner 10 mL 6   Insulin Pen Needle (TRUEPLUS PEN NEEDLES) 32G X 4 MM MISC Use to inject insulin twice daily. 100 each 2   losartan (COZAAR) 50 MG tablet TAKE 1 TABLET (50 MG TOTAL) BY MOUTH DAILY. 90 tablet 1   omeprazole (PRILOSEC) 20 MG capsule TAKE 1 CAPSULE (20 MG TOTAL) BY MOUTH DAILY. 30 capsule 3   OVER THE COUNTER MEDICATION Take 1 capsule by mouth at bedtime.     No current facility-administered medications on file prior to visit.    No Known Allergies  Social History   Socioeconomic History   Marital status: Married    Spouse name: Not on file   Number of children: 1   Years of education: 12 grade   Highest education level: Not on file  Occupational History   Occupation: unemployed  Tobacco Use   Smoking status: Never   Smokeless tobacco: Never  Vaping Use   Vaping Use: Never used  Substance and Sexual Activity   Alcohol use: Never   Drug use: Never   Sexual activity: Not on file  Other Topics Concern   Not on file  Social History Narrative   Not on file   Social Determinants of Health   Financial Resource Strain: Not on file  Food Insecurity: Not on file  Transportation Needs: Not on file  Physical Activity: Not on file   Stress: Not on file  Social Connections: Not on file  Intimate Partner Violence: Not on file    Family History  Problem Relation Age of Onset   Diabetes Mother    Colon cancer Neg Hx    Colon polyps Neg Hx    Esophageal cancer Neg Hx    Rectal cancer Neg Hx    Stomach cancer Neg Hx     Past Surgical History:  Procedure Laterality Date  ABDOMINAL HYSTERECTOMY     fibroid   CHOLECYSTECTOMY     REFRACTIVE SURGERY  2017   BL    ROS: Review of Systems Negative except as stated above  PHYSICAL EXAM: BP 110/77   Pulse 81   Resp 16   Wt 205 lb (93 kg)   SpO2 99%   BMI 36.31 kg/m   Wt Readings from Last 3 Encounters:  03/30/21 205 lb (93 kg)  03/09/21 203 lb 6.4 oz (92.3 kg)  02/23/21 206 lb (93.4 kg)    Physical Exam  General appearance - alert, well appearing, and in no distress Mental status - normal mood, behavior, speech, dress, motor activity, and thought processes Neck - supple, no significant adenopathy Chest - clear to auscultation, no wheezes, rales or rhonchi, symmetric air entry Heart - normal rate, regular rhythm, normal S1, S2, no murmurs, rubs, clicks or gallops Extremities - peripheral pulses normal, no pedal edema, no clubbing or cyanosis Skin -   Mild erythema over the cheeks extending to the mandibular jawline on both sides.  Slight peeling around the edges.  Diabetic Foot Exam - Simple   Simple Foot Form Visual Inspection No deformities, no ulcerations, no other skin breakdown bilaterally: Yes Sensation Testing Intact to touch and monofilament testing bilaterally: Yes Pulse Check Posterior Tibialis and Dorsalis pulse intact bilaterally: Yes Comments     CMP Latest Ref Rng & Units 03/09/2021 11/03/2020 12/24/2019  Glucose 70 - 99 mg/dL 152(H) 166(H) 92  BUN 8 - 23 mg/dL 23 21 37(H)  Creatinine 0.44 - 1.00 mg/dL 1.56(H) 1.43(H) 1.85(H)  Sodium 135 - 145 mmol/L 137 131(L) 131(L)  Potassium 3.5 - 5.1 mmol/L 4.1 4.9 4.8  Chloride 98 - 111  mmol/L 104 92(L) 96  CO2 22 - 32 mmol/L 25 25 25   Calcium 8.9 - 10.3 mg/dL 9.1 9.4 8.9  Total Protein 6.5 - 8.1 g/dL 7.9 7.5 -  Total Bilirubin 0.3 - 1.2 mg/dL 0.3 0.3 -  Alkaline Phos 38 - 126 U/L 102 119 -  AST 15 - 41 U/L 14(L) 11 -  ALT 0 - 44 U/L 8 9 -   Lipid Panel     Component Value Date/Time   CHOL 161 11/03/2020 1104   TRIG 120 11/03/2020 1104   HDL 62 11/03/2020 1104   CHOLHDL 2.6 11/03/2020 1104   LDLCALC 78 11/03/2020 1104    CBC    Component Value Date/Time   WBC 12.3 (H) 03/09/2021 1001   RBC 3.87 03/09/2021 1001   HGB 11.4 (L) 03/09/2021 1001   HGB 11.5 11/03/2020 1104   HCT 34.8 (L) 03/09/2021 1001   HCT 35.2 11/03/2020 1104   PLT 495 (H) 03/09/2021 1001   PLT 541 (H) 11/03/2020 1104   MCV 89.9 03/09/2021 1001   MCV 90 11/03/2020 1104   MCH 29.5 03/09/2021 1001   MCHC 32.8 03/09/2021 1001   RDW 13.3 03/09/2021 1001   RDW 12.7 11/03/2020 1104   LYMPHSABS 2.4 03/09/2021 1001   MONOABS 0.5 03/09/2021 1001   EOSABS 0.1 03/09/2021 1001   BASOSABS 0.0 03/09/2021 1001    ASSESSMENT AND PLAN:  1. Type 2 diabetes mellitus with morbid obesity ([place]) Commended her that her A1c has improved.  She will continue [place] on current dose of NPH/regular insulin.  Encouraged her to eat healthy and try to move as much as she can. - POCT glucose (manual entry) - POCT glycosylated hemoglobin (Hb A1C) - Ambulatory referral to Ophthalmology  2. Hypertension associated with  diabetes ([place]) At goal.  Continue current medications and low-salt diet.  3. Stage 3b chronic kidney disease (CKD) ([facility]) Stable.  Continue to monitor.  She has follow-up appointment coming up with her nephrologist Dr. Royce Macadamia.  4. Hyperlipidemia associated with type 2 diabetes mellitus ([place]) Continue atorvastatin.  5. Dermatitis - clobetasol (TEMOVATE) 0.05 % external solution; APPLY SMALL AMOUNT TO AFFECTED AREA 3 TIMES A WEEK AS NEEDED.  Dispense: 50 mL; Refill: 1 - Ambulatory referral to  Dermatology  6. Leukocytosis, unspecified type Being worked up and followed by hematology/oncology.  7. Need for immunization against influenza - Flu Vaccine QUAD 82moIM (Fluarix, Fluzone & Alfiuria Quad PF)   Patient was given the opportunity to ask questions.  Patient verbalized understanding of the plan and was able to repeat key elements of the plan.  [employer] Language interpreter used during this encounter. ##[id number] Vicky  Orders Placed This Encounter  Procedures   Flu Vaccine QUAD 630moM (Fluarix, Fluzone & Alfiuria Quad PF)   POCT glucose (manual entry)   POCT glycosylated hemoglobin (Hb A1C)     Requested Prescriptions   Pending Prescriptions Disp Refills   clobetasol (TEMOVATE) 0.05 % external solution 50 mL 1    Sig: APPLY SMALL AMOUNT TO AFFECTED AREA 3 TIMES A WEEK AS NEEDED.    No follow-ups on file.  DeKarle PlumberMD, FACP

## 2021-04-06 ENCOUNTER — Other Ambulatory Visit: Payer: Self-pay

## 2021-04-06 DIAGNOSIS — N1832 Chronic kidney disease, stage 3b: Secondary | ICD-10-CM | POA: Diagnosis not present

## 2021-04-06 DIAGNOSIS — E1122 Type 2 diabetes mellitus with diabetic chronic kidney disease: Secondary | ICD-10-CM | POA: Diagnosis not present

## 2021-04-06 DIAGNOSIS — I129 Hypertensive chronic kidney disease with stage 1 through stage 4 chronic kidney disease, or unspecified chronic kidney disease: Secondary | ICD-10-CM | POA: Diagnosis not present

## 2021-04-06 DIAGNOSIS — E1129 Type 2 diabetes mellitus with other diabetic kidney complication: Secondary | ICD-10-CM | POA: Diagnosis not present

## 2021-04-06 DIAGNOSIS — E875 Hyperkalemia: Secondary | ICD-10-CM | POA: Diagnosis not present

## 2021-04-06 DIAGNOSIS — D631 Anemia in chronic kidney disease: Secondary | ICD-10-CM | POA: Diagnosis not present

## 2021-04-06 DIAGNOSIS — R809 Proteinuria, unspecified: Secondary | ICD-10-CM | POA: Diagnosis not present

## 2021-04-14 ENCOUNTER — Other Ambulatory Visit: Payer: Self-pay

## 2021-04-20 ENCOUNTER — Ambulatory Visit
Admission: RE | Admit: 2021-04-20 | Discharge: 2021-04-20 | Disposition: A | Discharge status: Home / Self Care | Payer: Medicare Other | Source: Ambulatory Visit | Attending: Internal Medicine | Admitting: Internal Medicine

## 2021-04-20 ENCOUNTER — Other Ambulatory Visit: Payer: Self-pay

## 2021-04-20 DIAGNOSIS — M810 Age-related osteoporosis without current pathological fracture: Secondary | ICD-10-CM | POA: Diagnosis not present

## 2021-04-20 DIAGNOSIS — Z780 Asymptomatic menopausal state: Secondary | ICD-10-CM

## 2021-04-21 ENCOUNTER — Other Ambulatory Visit: Payer: Self-pay | Admitting: Internal Medicine

## 2021-04-21 DIAGNOSIS — M810 Age-related osteoporosis without current pathological fracture: Secondary | ICD-10-CM | POA: Insufficient documentation

## 2021-04-21 NOTE — Progress Notes (Signed)
Let patient know that her bone density study reveals that she has osteoporosis which is significant thinning of the bones.  This can lead to easy fractures.  I will refer her to an endocrinologist for further evaluation and management.

## 2021-05-04 ENCOUNTER — Other Ambulatory Visit: Payer: Self-pay | Admitting: Internal Medicine

## 2021-05-04 ENCOUNTER — Other Ambulatory Visit: Payer: Self-pay

## 2021-05-04 DIAGNOSIS — E1142 Type 2 diabetes mellitus with diabetic polyneuropathy: Secondary | ICD-10-CM

## 2021-05-04 MED ORDER — GABAPENTIN 400 MG PO CAPS
ORAL_CAPSULE | ORAL | 5 refills | Status: DC
  Filled 2021-05-04 – 2021-05-14 (×2): qty 60, 30d supply, fill #0

## 2021-05-04 NOTE — Telephone Encounter (Signed)
Will forward to provider  

## 2021-05-05 ENCOUNTER — Other Ambulatory Visit: Payer: Self-pay

## 2021-05-12 ENCOUNTER — Other Ambulatory Visit: Payer: Self-pay

## 2021-05-14 ENCOUNTER — Other Ambulatory Visit: Payer: Self-pay

## 2021-05-29 ENCOUNTER — Other Ambulatory Visit: Payer: Self-pay

## 2021-07-10 ENCOUNTER — Other Ambulatory Visit: Payer: Self-pay

## 2021-07-13 ENCOUNTER — Other Ambulatory Visit: Payer: Self-pay

## 2021-07-14 ENCOUNTER — Other Ambulatory Visit: Payer: Self-pay

## 2021-07-14 ENCOUNTER — Telehealth: Payer: Self-pay | Admitting: Internal Medicine

## 2021-07-14 NOTE — Telephone Encounter (Signed)
Soni, NP called to report that the patient has been experiencing headaches for a few months now with a pain level at 8/9 (scale of 1 to 10 with 10 being the worst) Best contact: [phone]

## 2021-07-20 NOTE — Telephone Encounter (Signed)
Pt has been scheduled a virtual appt for 2/13 at 810

## 2021-07-27 ENCOUNTER — Ambulatory Visit: Payer: Medicare Other | Attending: Internal Medicine | Admitting: Internal Medicine

## 2021-07-27 ENCOUNTER — Other Ambulatory Visit: Payer: Self-pay

## 2021-07-27 ENCOUNTER — Encounter: Payer: Self-pay | Admitting: Internal Medicine

## 2021-07-27 DIAGNOSIS — G43109 Migraine with aura, not intractable, without status migrainosus: Secondary | ICD-10-CM | POA: Diagnosis not present

## 2021-07-27 MED ORDER — TOPIRAMATE 25 MG PO TABS
25.0000 mg | ORAL_TABLET | Freq: Every evening | ORAL | 1 refills | Status: DC | PRN
  Filled 2021-07-27 – 2021-09-09 (×2): qty 30, 30d supply, fill #0
  Filled 2022-07-06: qty 30, 30d supply, fill #1

## 2021-07-27 NOTE — Progress Notes (Signed)
Virtual Visit via Telephone Note  I connected with Chelsea Coleman on 07/27/2021 at 8:16 AM by telephone and verified that I am speaking with the correct person using two identifiers  Location: Patient: home Provider: office  Participants: Myself Patient [place] interpreter: [phone], Delorise Shiner   I discussed the limitations, risks, security and privacy concerns of performing an evaluation and management service by telephone and the availability of in person appointments. I also discussed with the patient that there may be a patient responsible charge related to this service. The patient expressed understanding and agreed to proceed.   History of Present Illness: Pt with hx of DM neuropathy and retinopathy, HTN, HL, obesity, anemia of chronic ds, thrombocytosis, CKD 3b, ACD, Vit D, osteoporosis.  This is an UC visit for headaches.  Pt c/o daily headaches x 1 mth Headaches sometimes start in the neck and move up; other times it starts at the top of the head and goes to the neck towards the shoulders  No associated N/V Endorses blurred vision (has appt with ophthalmology in 08/2021), dizziness, photophobia (better in darkness) and tearing BL.  Feels better laying down than up pacing around.  No triggers identified. No falls Headaches comes on suddenly with "a little prick to the RT eye." Can last a full day.   Relieve within 1/2 hr of taking one Extra Strength Tylenol Use to have "strong migraines when I was young where I could not go outside." Occurred 25 yrs ago.  Current headaches feels like previous migraines but sometimes they are lighter.     Outpatient Encounter Medications as of 07/27/2021  Medication Sig   Insulin Syringe-Needle U-100 31G X 5/16" 0.3 ML MISC Use to inject insulin twice daily   Accu-Chek FastClix Lancets MISC Use to check blood sugar 3 TIMES DAILY. (Patient taking differently: Use to check blood sugar 3 TIMES DAILY.)   amLODipine (NORVASC) 5 MG  tablet Take 1.5 tablets (7.5 mg total) by mouth daily.   atorvastatin (LIPITOR) 20 MG tablet TAKE 1 TABLET (20 MG TOTAL) BY MOUTH DAILY.   Blood Glucose Monitoring Suppl (ACCU-CHEK GUIDE ME) w/Device KIT Use to check blood sugar 3 TIMES DAILY. (Patient taking differently: Use to check blood sugar 3 TIMES DAILY.)   clobetasol (TEMOVATE) 0.05 % external solution APPLY SMALL AMOUNT TO AFFECTED AREA 3 TIMES A WEEK AS NEEDED.   dapagliflozin propanediol (FARXIGA) 5 MG TABS tablet Take 1 tablet (5 mg total) by mouth daily before breakfast.   gabapentin (NEURONTIN) 400 MG capsule TAKE 1 CAPSULE (400 MG TOTAL) BY MOUTH 2 (TWO) TIMES DAILY.   glucose blood (ACCU-CHEK GUIDE) test strip Use to check blood sugar 3 TIMES DAILY. (Patient taking differently: Use to check blood sugar 3 TIMES DAILY.)   insulin NPH-regular Human (70-30) 100 UNIT/ML injection Inj 20 units in a.m with breakfast and 10 units with dinner   Insulin Pen Needle (TRUEPLUS PEN NEEDLES) 32G X 4 MM MISC Use to inject insulin twice daily.   losartan (COZAAR) 50 MG tablet TAKE 1 TABLET (50 MG TOTAL) BY MOUTH DAILY.   omeprazole (PRILOSEC) 20 MG capsule TAKE 1 CAPSULE (20 MG TOTAL) BY MOUTH DAILY.   OVER THE COUNTER MEDICATION Take 1 capsule by mouth at bedtime.   No facility-administered encounter medications on file as of 07/27/2021.      Observations/Objective: No direct observation done as this was a telephone visit  Assessment and Plan: 1. Migraine with aura and without status migrainosus, not intractable Symptoms concerning  for reemergence of migraines.  However given her age, imaging study will be done. Discussed management of migraines.  Tylenol seems to be working for abortive therapy.  However given the frequency we need something for prophylaxis.  I am reluctant to put her on propranolol not knowing what her blood pressure or pulse rate looks like at this time.  Elavil can cause anticholinergic S.E in elderly.  Will try low dose  Topamax at bedtime Refer to Neurology - topiramate (TOPAMAX) 25 MG tablet; Take 1 tablet (25 mg total) by mouth at bedtime as needed.  Dispense: 30 tablet; Refill: 1 - Ambulatory referral to Neurology - MR Brain W Wo Contrast; Future   Follow Up Instructions: 2-3 wks for chronic ds management and f/u on headaches.   I discussed the assessment and treatment plan with the patient. The patient was provided an opportunity to ask questions and all were answered. The patient agreed with the plan and demonstrated an understanding of the instructions.   The patient was advised to call back or seek an in-person evaluation if the symptoms worsen or if the condition fails to improve as anticipated.  I  Spent 27 minutes on this telephone encounter  This note has been created with Surveyor, quantity. Any transcriptional errors are unintentional.  Karle Plumber, MD

## 2021-07-28 ENCOUNTER — Telehealth: Payer: Self-pay

## 2021-07-28 NOTE — Telephone Encounter (Signed)
Will send pt  appointment reminder

## 2021-07-28 NOTE — Telephone Encounter (Signed)
Pt is aware of MRI appt date and time/ pt asked if you can call and leave a voicemail on her phone with the details, especially address

## 2021-07-28 NOTE — Telephone Encounter (Signed)
MRI is scheduled for February 27 at 5pm at [facility]. Pt will need to arrive at 445pm.   [place] interpreter ID: [phone]  Contacted pt to go over appointment information. Pt didn't answer lvm.   No prior Josem Kaufmann is needed for MRI checked the [employer] site

## 2021-08-03 ENCOUNTER — Other Ambulatory Visit: Payer: Self-pay

## 2021-08-10 ENCOUNTER — Other Ambulatory Visit: Payer: Self-pay

## 2021-08-10 ENCOUNTER — Ambulatory Visit (HOSPITAL_COMMUNITY)
Admission: RE | Admit: 2021-08-10 | Discharge: 2021-08-10 | Disposition: A | Discharge status: Home / Self Care | Payer: Medicare Other | Source: Ambulatory Visit | Attending: Internal Medicine | Admitting: Internal Medicine

## 2021-08-10 DIAGNOSIS — G43909 Migraine, unspecified, not intractable, without status migrainosus: Secondary | ICD-10-CM | POA: Diagnosis not present

## 2021-08-10 DIAGNOSIS — G43109 Migraine with aura, not intractable, without status migrainosus: Secondary | ICD-10-CM | POA: Insufficient documentation

## 2021-08-10 MED ORDER — GADOBUTROL 1 MMOL/ML IV SOLN
9.5000 mL | Freq: Once | INTRAVENOUS | Status: AC | PRN
  Administered 2021-08-10: 9.5 mL via INTRAVENOUS

## 2021-08-11 NOTE — Progress Notes (Signed)
Let patient know that MRI of the head revealed no acute findings.  Incidental finding is changes suggestive of old strokes date of occurrence unknown.  Good blood pressure and diabetes control is important.  Continue atorvastatin to help lower cholesterol.  I would also recommend taking a baby aspirin 81 mg daily.  This can be purchased over-the-counter. Take with food. MRI also showed some mild inflammation in the sinuses.  We will discuss further on upcoming visit.  I have referred her to neurology.

## 2021-08-12 ENCOUNTER — Encounter: Payer: Self-pay | Admitting: Internal Medicine

## 2021-08-12 NOTE — Progress Notes (Signed)
I received a housecalls visit summary done by [facility] on 07/14/2021.  A1c was 7.5%. ?

## 2021-08-13 ENCOUNTER — Telehealth: Payer: Self-pay

## 2021-08-13 NOTE — Telephone Encounter (Signed)
error 

## 2021-08-19 ENCOUNTER — Telehealth: Payer: Self-pay

## 2021-08-19 NOTE — Telephone Encounter (Signed)
[employer] interpreters Angela Nevin Id# [phone]  contacted pt to go over lab results  pt is aware and doesn't have any questions or concerns  ?

## 2021-09-07 ENCOUNTER — Other Ambulatory Visit: Payer: Self-pay | Admitting: Physician Assistant

## 2021-09-07 DIAGNOSIS — D72829 Elevated white blood cell count, unspecified: Secondary | ICD-10-CM

## 2021-09-07 DIAGNOSIS — D75839 Thrombocytosis, unspecified: Secondary | ICD-10-CM

## 2021-09-08 ENCOUNTER — Inpatient Hospital Stay (HOSPITAL_BASED_OUTPATIENT_CLINIC_OR_DEPARTMENT_OTHER): Payer: Medicare Other | Admitting: Physician Assistant

## 2021-09-08 ENCOUNTER — Inpatient Hospital Stay: Payer: Medicare Other | Attending: Physician Assistant

## 2021-09-08 ENCOUNTER — Other Ambulatory Visit: Payer: Self-pay

## 2021-09-08 VITALS — BP 149/72 | HR 83 | Temp 97.9°F | Resp 18 | Wt 199.3 lb

## 2021-09-08 DIAGNOSIS — Z79899 Other long term (current) drug therapy: Secondary | ICD-10-CM | POA: Insufficient documentation

## 2021-09-08 DIAGNOSIS — D72829 Elevated white blood cell count, unspecified: Secondary | ICD-10-CM | POA: Diagnosis not present

## 2021-09-08 DIAGNOSIS — D75839 Thrombocytosis, unspecified: Secondary | ICD-10-CM | POA: Insufficient documentation

## 2021-09-08 DIAGNOSIS — M2550 Pain in unspecified joint: Secondary | ICD-10-CM | POA: Insufficient documentation

## 2021-09-08 LAB — CMP (CANCER CENTER ONLY)
ALT: 9 U/L (ref 0–44)
AST: 12 U/L — ABNORMAL LOW (ref 15–41)
Albumin: 3.7 g/dL (ref 3.5–5.0)
Alkaline Phosphatase: 99 U/L (ref 38–126)
Anion gap: 6 (ref 5–15)
BUN: 24 mg/dL — ABNORMAL HIGH (ref 8–23)
CO2: 26 mmol/L (ref 22–32)
Calcium: 9 mg/dL (ref 8.9–10.3)
Chloride: 104 mmol/L (ref 98–111)
Creatinine: 1.47 mg/dL — ABNORMAL HIGH (ref 0.44–1.00)
GFR, Estimated: 39 mL/min — ABNORMAL LOW (ref >60)
Glucose, Bld: 125 mg/dL — ABNORMAL HIGH (ref 70–99)
Potassium: 4.2 mmol/L (ref 3.5–5.1)
Sodium: 136 mmol/L (ref 135–145)
Total Bilirubin: 0.3 mg/dL (ref 0.3–1.2)
Total Protein: 7.7 g/dL (ref 6.5–8.1)

## 2021-09-08 LAB — CBC WITH DIFFERENTIAL (CANCER CENTER ONLY)
Abs Immature Granulocytes: 0.04 10*3/uL (ref 0.00–0.07)
Basophils Absolute: 0.1 10*3/uL (ref 0.0–0.1)
Basophils Relative: 1 %
Eosinophils Absolute: 0.3 10*3/uL (ref 0.0–0.5)
Eosinophils Relative: 3 %
HCT: 35.5 % — ABNORMAL LOW (ref 36.0–46.0)
Hemoglobin: 11.7 g/dL — ABNORMAL LOW (ref 12.0–15.0)
Immature Granulocytes: 0 %
Lymphocytes Relative: 16 %
Lymphs Abs: 1.7 10*3/uL (ref 0.7–4.0)
MCH: 29.8 pg (ref 26.0–34.0)
MCHC: 33 g/dL (ref 30.0–36.0)
MCV: 90.6 fL (ref 80.0–100.0)
Monocytes Absolute: 0.5 10*3/uL (ref 0.1–1.0)
Monocytes Relative: 5 %
Neutro Abs: 8.1 10*3/uL — ABNORMAL HIGH (ref 1.7–7.7)
Neutrophils Relative %: 75 %
Platelet Count: 461 10*3/uL — ABNORMAL HIGH (ref 150–400)
RBC: 3.92 MIL/uL (ref 3.87–5.11)
RDW: 13.1 % (ref 11.5–15.5)
WBC Count: 10.7 10*3/uL — ABNORMAL HIGH (ref 4.0–10.5)
nRBC: 0 % (ref 0.0–0.2)

## 2021-09-08 NOTE — Progress Notes (Signed)
?[place] ?[phone]   [phone] ? ?PROGRESS NOTE ? ?Patient Care Team: ?Ladell Pier, MD as PCP - General (Internal Medicine) ? ?Hematological/Oncological History ?# Thrombocytosis/Leukocytosis ?1) Labs from PCP, Dr. Karle Plumber: ?-01/26/2019: WBC 10.0, Hgb 10.4 (L), MCV 90, Plt 553 (H), Iron 50, TIBC 228 (L), Ferritin 573 (H), Iron Saturation 22, Folate 11 ?-10/01/2019: WBC 9.6, Hgb 11.1, MCV 92, Plt 487 (H) ?-11/03/2020: WBC 11.7 (H), Hgb 11.5, MCV 90, Plt 541 (H) ? ? 2) 11/18/2020: Establish care with Dede Query PA-C ? ?Interval History:  ?[place] 66 y.o. female with medical history significant for leukocytosis/thrombocytosis who presents for a follow up visit. The patient's last visit was on 03/09/2021 with Dr. Lorenso Courier. ? ?At today's visit, Mrs. Diedrich reports her energy levels are stable. She is able to complete all her daily activities on her own. She has a good appetite and denies any recent weight changes. She denies nausea, vomiting or abdominal pain. He has intermittent episodes of headaches last 1-2 days. She recently underwent workup with her PCP including a brain MRI that was negative. She continues to have diffuse joint pain that has been present for several months. She reports chronic constipation but does have a daily BM. She denies fevers, chills, night sweats, shortness of breath, chest pain or cough. She has no other complaints. A full 10 point ROS is listed below. ? ? ?MEDICAL HISTORY:  ?Past Medical History:  ?Diagnosis Date  ? Cataract   ? states removed in 2014  ? Chronic kidney disease 02/14/2020  ? Stage 3   ? Diabetes mellitus without complication ([place])   ? GERD (gastroesophageal reflux disease)   ? Hypertension   ? ? ?SURGICAL HISTORY: ?Past Surgical History:  ?Procedure Laterality Date  ? ABDOMINAL HYSTERECTOMY    ? fibroid  ? CHOLECYSTECTOMY    ? REFRACTIVE SURGERY  2017  ? BL  ? ? ?SOCIAL HISTORY: ?Social History   ? ?Socioeconomic History  ? Marital status: Married  ?  Spouse name: Not on file  ? Number of children: 1  ? Years of education: 12 grade  ? Highest education level: Not on file  ?Occupational History  ? Occupation: unemployed  ?Tobacco Use  ? Smoking status: Never  ? Smokeless tobacco: Never  ?Vaping Use  ? Vaping Use: Never used  ?Substance and Sexual Activity  ? Alcohol use: Never  ? Drug use: Never  ? Sexual activity: Not on file  ?Other Topics Concern  ? Not on file  ?Social History Narrative  ? Not on file  ? ?Social Determinants of Health  ? ?Financial Resource Strain: Not on file  ?Food Insecurity: Not on file  ?Transportation Needs: Not on file  ?Physical Activity: Not on file  ?Stress: Not on file  ?Social Connections: Not on file  ?Intimate Partner Violence: Not on file  ? ? ?FAMILY HISTORY: ?Family History  ?Problem Relation Age of Onset  ? Diabetes Mother   ? Colon cancer Neg Hx   ? Colon polyps Neg Hx   ? Esophageal cancer Neg Hx   ? Rectal cancer Neg Hx   ? Stomach cancer Neg Hx   ? ? ?ALLERGIES:  has No Known Allergies. ? ?MEDICATIONS:  ?Current Outpatient Medications  ?Medication Sig Dispense Refill  ? Insulin Syringe-Needle U-100 31G X 5/16" 0.3 ML MISC Use to inject insulin twice daily 100 each 2  ? Accu-Chek FastClix Lancets MISC Use to check blood sugar 3  TIMES DAILY. (Patient taking differently: Use to check blood sugar 3 TIMES DAILY.) 100 each 2  ? amLODipine (NORVASC) 5 MG tablet Take 1.5 tablets (7.5 mg total) by mouth daily. 135 tablet 3  ? atorvastatin (LIPITOR) 20 MG tablet TAKE 1 TABLET (20 MG TOTAL) BY MOUTH DAILY. 90 tablet 3  ? Blood Glucose Monitoring Suppl (ACCU-CHEK GUIDE ME) w/Device KIT Use to check blood sugar 3 TIMES DAILY. (Patient taking differently: Use to check blood sugar 3 TIMES DAILY.) 1 kit 0  ? clobetasol (TEMOVATE) 0.05 % external solution APPLY SMALL AMOUNT TO AFFECTED AREA 3 TIMES A WEEK AS NEEDED. 50 mL 1  ? dapagliflozin propanediol (FARXIGA) 5 MG TABS tablet  Take 1 tablet (5 mg total) by mouth daily before breakfast. 30 tablet 6  ? gabapentin (NEURONTIN) 400 MG capsule TAKE 1 CAPSULE (400 MG TOTAL) BY MOUTH 2 (TWO) TIMES DAILY. 60 capsule 5  ? glucose blood (ACCU-CHEK GUIDE) test strip Use to check blood sugar 3 TIMES DAILY. (Patient taking differently: Use to check blood sugar 3 TIMES DAILY.) 100 each 2  ? insulin NPH-regular Human (70-30) 100 UNIT/ML injection Inj 20 units in a.m with breakfast and 10 units with dinner 10 mL 6  ? Insulin Pen Needle (TRUEPLUS PEN NEEDLES) 32G X 4 MM MISC Use to inject insulin twice daily. 100 each 2  ? losartan (COZAAR) 50 MG tablet TAKE 1 TABLET (50 MG TOTAL) BY MOUTH DAILY. 90 tablet 1  ? omeprazole (PRILOSEC) 20 MG capsule TAKE 1 CAPSULE (20 MG TOTAL) BY MOUTH DAILY. 30 capsule 3  ? OVER THE COUNTER MEDICATION Take 1 capsule by mouth at bedtime.    ? topiramate (TOPAMAX) 25 MG tablet Take 1 tablet (25 mg total) by mouth at bedtime as needed. 30 tablet 1  ? ?No current facility-administered medications for this visit.  ? ? ?REVIEW OF SYSTEMS:   ?Constitutional: ( - ) fevers, ( - )  chills , ( - ) night sweats ?Eyes: ( - ) blurriness of vision, ( - ) double vision, ( - ) watery eyes ?Ears, nose, mouth, throat, and face: ( - ) mucositis, ( - ) sore throat ?Respiratory: ( - ) cough, ( - ) dyspnea, ( - ) wheezes ?Cardiovascular: ( - ) palpitation, ( - ) chest discomfort, ( - ) lower extremity swelling ?Gastrointestinal:  ( - ) nausea, ( - ) heartburn, ( - ) change in bowel habits ?Skin: ( - ) abnormal skin rashes ?Lymphatics: ( - ) new lymphadenopathy, ( - ) easy bruising ?Neurological: ( - ) numbness, ( - ) tingling, ( - ) new weaknesses ?Behavioral/Psych: ( - ) mood change, ( - ) new changes  ?All other systems were reviewed with the patient and are negative. ? ?PHYSICAL EXAMINATION: ? ?Vitals:  ? 09/08/21 1247  ?BP: (!) 149/72  ?Pulse: 83  ?Resp: 18  ?Temp: 97.9 ?F (36.6 ?C)  ?SpO2: 99%  ? ?Filed Weights  ? 09/08/21 1247  ?Weight: 199  lb 4.8 oz (90.4 kg)  ? ? ?GENERAL: well appearing elderly [place] female, alert, no distress and comfortable ?SKIN: skin color, texture, turgor are normal, no rashes or significant lesions ?EYES: conjunctiva are pink and non-injected, sclera clear ?LUNGS: clear to auscultation and percussion with normal breathing effort ?HEART: regular rate & rhythm and no murmurs and no lower extremity edema ?Musculoskeletal: no cyanosis of digits and no clubbing  ?PSYCH: alert & oriented x 3, fluent speech ?NEURO: no focal motor/sensory deficits ? ?LABORATORY  DATA:  ?I have reviewed the data as listed ? ?  Latest Ref Rng & Units 09/08/2021  ? 12:24 PM 03/09/2021  ? 10:01 AM 11/18/2020  ?  2:05 PM  ?CBC  ?WBC 4.0 - 10.5 K/uL 10.7   12.3   11.9    ?Hemoglobin 12.0 - 15.0 g/dL 11.7   11.4   10.8    ?Hematocrit 36.0 - 46.0 % 35.5   34.8   32.3    ?Platelets 150 - 400 K/uL 461   495   478    ? ? ? ?  Latest Ref Rng & Units 09/08/2021  ? 12:24 PM 03/09/2021  ? 10:01 AM 11/03/2020  ? 11:04 AM  ?CMP  ?Glucose 70 - 99 mg/dL 125   152   166    ?BUN 8 - 23 mg/dL 24   23   21     ?Creatinine 0.44 - 1.00 mg/dL 1.47   1.56   1.43    ?Sodium 135 - 145 mmol/L 136   137   131    ?Potassium 3.5 - 5.1 mmol/L 4.2   4.1   4.9    ?Chloride 98 - 111 mmol/L 104   104   92    ?CO2 22 - 32 mmol/L 26   25   25     ?Calcium 8.9 - 10.3 mg/dL 9.0   9.1   9.4    ?Total Protein 6.5 - 8.1 g/dL 7.7   7.9   7.5    ?Total Bilirubin 0.3 - 1.2 mg/dL 0.3   0.3   0.3    ?Alkaline Phos 38 - 126 U/L 99   102   119    ?AST 15 - 41 U/L 12   14   11     ?ALT 0 - 44 U/L 9   8   9     ? ? ?RADIOGRAPHIC STUDIES: ?I have personally reviewed the radiological images as listed and agreed with the findings in the report. ?MR Brain W Wo Contrast ? ?Result Date: 08/10/2021 ?CLINICAL DATA:  Provided history: Migraine with aura and without status migrainosus, not intractable. Headache, new or worsening. EXAM: MRI HEAD WITHOUT AND WITH CONTRAST TECHNIQUE: Multiplanar, multiecho pulse sequences  of the brain and surrounding structures were obtained without and with intravenous contrast. CONTRAST:  9.82m GADAVIST GADOBUTROL 1 MMOL/ML IV SOLN COMPARISON:  No pertinent prior exams available for com

## 2021-09-09 ENCOUNTER — Other Ambulatory Visit: Payer: Self-pay | Admitting: Internal Medicine

## 2021-09-09 ENCOUNTER — Other Ambulatory Visit: Payer: Self-pay

## 2021-09-09 DIAGNOSIS — N183 Chronic kidney disease, stage 3 unspecified: Secondary | ICD-10-CM

## 2021-09-09 MED ORDER — ATORVASTATIN CALCIUM 20 MG PO TABS
ORAL_TABLET | Freq: Every day | ORAL | 0 refills | Status: DC
  Filled 2021-09-09: qty 90, 90d supply, fill #0

## 2021-09-09 MED ORDER — LOSARTAN POTASSIUM 50 MG PO TABS
ORAL_TABLET | Freq: Every day | ORAL | 0 refills | Status: DC
  Filled 2021-09-09: qty 90, 90d supply, fill #0

## 2021-09-09 MED ORDER — AMLODIPINE BESYLATE 5 MG PO TABS
7.5000 mg | ORAL_TABLET | Freq: Every day | ORAL | 0 refills | Status: DC
  Filled 2021-09-09: qty 135, 90d supply, fill #0

## 2021-09-15 ENCOUNTER — Other Ambulatory Visit: Payer: Self-pay

## 2021-09-21 DIAGNOSIS — N1832 Chronic kidney disease, stage 3b: Secondary | ICD-10-CM | POA: Diagnosis not present

## 2021-09-21 DIAGNOSIS — E559 Vitamin D deficiency, unspecified: Secondary | ICD-10-CM | POA: Diagnosis not present

## 2021-09-28 DIAGNOSIS — E1129 Type 2 diabetes mellitus with other diabetic kidney complication: Secondary | ICD-10-CM | POA: Diagnosis not present

## 2021-09-28 DIAGNOSIS — E875 Hyperkalemia: Secondary | ICD-10-CM | POA: Diagnosis not present

## 2021-09-28 DIAGNOSIS — D631 Anemia in chronic kidney disease: Secondary | ICD-10-CM | POA: Diagnosis not present

## 2021-09-28 DIAGNOSIS — E1122 Type 2 diabetes mellitus with diabetic chronic kidney disease: Secondary | ICD-10-CM | POA: Diagnosis not present

## 2021-09-28 DIAGNOSIS — I129 Hypertensive chronic kidney disease with stage 1 through stage 4 chronic kidney disease, or unspecified chronic kidney disease: Secondary | ICD-10-CM | POA: Diagnosis not present

## 2021-09-28 DIAGNOSIS — R809 Proteinuria, unspecified: Secondary | ICD-10-CM | POA: Diagnosis not present

## 2021-09-28 DIAGNOSIS — N1832 Chronic kidney disease, stage 3b: Secondary | ICD-10-CM | POA: Diagnosis not present

## 2021-10-09 ENCOUNTER — Other Ambulatory Visit: Payer: Self-pay | Admitting: Internal Medicine

## 2021-10-09 ENCOUNTER — Other Ambulatory Visit: Payer: Self-pay

## 2021-10-09 DIAGNOSIS — E1142 Type 2 diabetes mellitus with diabetic polyneuropathy: Secondary | ICD-10-CM

## 2021-10-09 MED ORDER — HUMULIN 70/30 (70-30) 100 UNIT/ML ~~LOC~~ SUSP
SUBCUTANEOUS | 2 refills | Status: DC
  Filled 2021-10-09: qty 10, 31d supply, fill #0
  Filled 2021-12-07: qty 10, 28d supply, fill #0
  Filled 2021-12-09: qty 30, 93d supply, fill #0

## 2021-10-16 ENCOUNTER — Other Ambulatory Visit: Payer: Self-pay

## 2021-11-04 ENCOUNTER — Other Ambulatory Visit: Payer: Self-pay | Admitting: Pharmacist

## 2021-11-04 DIAGNOSIS — E1142 Type 2 diabetes mellitus with diabetic polyneuropathy: Secondary | ICD-10-CM

## 2021-11-04 MED ORDER — GABAPENTIN 400 MG PO CAPS: ORAL_CAPSULE | ORAL | 0 refills | Status: DC

## 2021-11-30 ENCOUNTER — Encounter: Payer: Self-pay | Admitting: Internal Medicine

## 2021-11-30 ENCOUNTER — Ambulatory Visit: Payer: Medicare Other | Admitting: Internal Medicine

## 2021-11-30 VITALS — BP 136/88 | HR 78 | Ht 63.0 in | Wt 199.6 lb

## 2021-11-30 DIAGNOSIS — N2581 Secondary hyperparathyroidism of renal origin: Secondary | ICD-10-CM | POA: Diagnosis not present

## 2021-11-30 DIAGNOSIS — M810 Age-related osteoporosis without current pathological fracture: Secondary | ICD-10-CM | POA: Insufficient documentation

## 2021-11-30 DIAGNOSIS — I10 Essential (primary) hypertension: Secondary | ICD-10-CM

## 2021-11-30 LAB — COMPREHENSIVE METABOLIC PANEL
ALT: 11 U/L (ref 0–35)
AST: 16 U/L (ref 0–37)
Albumin: 3.7 g/dL (ref 3.5–5.2)
Alkaline Phosphatase: 100 U/L (ref 39–117)
BUN: 17 mg/dL (ref 6–23)
CO2: 27 mEq/L (ref 19–32)
Calcium: 9.2 mg/dL (ref 8.4–10.5)
Chloride: 98 mEq/L (ref 96–112)
Creatinine, Ser: 1.76 mg/dL — ABNORMAL HIGH (ref 0.40–1.20)
GFR: 29.66 mL/min — ABNORMAL LOW (ref >60.00)
Glucose, Bld: 220 mg/dL — ABNORMAL HIGH (ref 70–99)
Potassium: 4.2 mEq/L (ref 3.5–5.1)
Sodium: 132 mEq/L — ABNORMAL LOW (ref 135–145)
Total Bilirubin: 0.2 mg/dL (ref 0.2–1.2)
Total Protein: 7.9 g/dL (ref 6.0–8.3)

## 2021-11-30 LAB — VITAMIN D 25 HYDROXY (VIT D DEFICIENCY, FRACTURES): VITD: 30.22 ng/mL (ref 30.00–100.00)

## 2021-11-30 LAB — T4, FREE: Free T4: 1.14 ng/dL (ref 0.60–1.60)

## 2021-11-30 LAB — TSH: TSH: 1.57 u[IU]/mL (ref 0.35–5.50)

## 2021-11-30 NOTE — Progress Notes (Unsigned)
Name: Chelsea Coleman  MRN/ DOB: [id number], 09-20-1955    Age/ Sex: 66 y.o., female    PCP: Ladell Pier, MD   Reason for Endocrinology Evaluation: Osteoporosis     Date of Initial Endocrinology Evaluation: 11/30/2021     HPI: Chelsea Coleman is a 66 y.o. female with a past medical history of T2DM, HTN, CKD III and dyslipidemia. The patient presented for initial endocrinology clinic visit on 11/30/2021 for consultative assistance with her osteoporosis.    Interpreter line has been used Lowella Dandy)  Chelsea Coleman was diagnosed with osteoporosis: November 2022 with a T score of -5 at the distal radius  Menarche at age : 20 Menopausal at age : Chelsea Coleman had total hysterectomy at age 78's Fracture Hx: no Hx of HRT: yes , short term  due to hirsutism  FH of osteoporosis or hip fracture: no Prior Hx of anti-estrogenic therapy :no Prior Hx of anti-resorptive therapy : no   Of note the patient has had CKD III since at least 2020. Chelsea Coleman follows with nephrology   Chelsea Coleman is not on calcium due to constipation  Chelsea Coleman is not on vitamin D as well   Eats very little dairy  Heartburn is rare, Chelsea Coleman is omeprazole       HISTORY:  Past Medical History:  Past Medical History:  Diagnosis Date   Cataract    states removed in 2014   Chronic kidney disease 02/14/2020   Stage 3    Diabetes mellitus without complication ([facility])    GERD (gastroesophageal reflux disease)    Hypertension    Past Surgical History:  Past Surgical History:  Procedure Laterality Date   ABDOMINAL HYSTERECTOMY     fibroid   CHOLECYSTECTOMY     REFRACTIVE SURGERY  2017   BL    Social History:  reports that Chelsea Coleman has never smoked. Chelsea Coleman has never used smokeless tobacco. Chelsea Coleman reports that Chelsea Coleman does not drink alcohol and does not use drugs. Family History: family history includes Diabetes in her mother.   HOME MEDICATIONS: Allergies as of 11/30/2021   No Known Allergies      Medication List         Accurate as of November 30, 2021  1:56 PM. If you have any questions, ask your nurse or doctor.          Accu-Chek FastClix Lancets Misc Use to check blood sugar 3 TIMES DAILY.   Accu-Chek Guide test strip Generic drug: glucose blood Use to check blood sugar 3 TIMES DAILY.   Accu-Chek Guide w/Device Kit Use to check blood sugar 3 TIMES DAILY.   amLODipine 5 MG tablet Commonly known as: NORVASC Take 1.5 tablets (7.5 mg total) by mouth daily.   atorvastatin 20 MG tablet Commonly known as: LIPITOR TAKE 1 TABLET (20 MG TOTAL) BY MOUTH DAILY.   clobetasol 0.05 % external solution Commonly known as: TEMOVATE APPLY SMALL AMOUNT TO AFFECTED AREA 3 TIMES A WEEK AS NEEDED.   Farxiga 5 MG Tabs tablet Generic drug: dapagliflozin propanediol Tome 1 tableta (5 mg en total) por va oral diariamente antes de desayunar. (Take 1 tablet (5 mg total) by mouth daily before breakfast.)   gabapentin 400 MG capsule Commonly known as: NEURONTIN TAKE 1 CAPSULE (400 MG TOTAL) BY MOUTH 2 (TWO) TIMES DAILY.   HumuLIN 70/30 (70-30) 100 UNIT/ML injection Generic drug: insulin NPH-regular Human Inj 20 units in a.m with breakfast and 10 units with dinner  losartan 50 MG tablet Commonly known as: COZAAR TAKE 1 TABLET (50 MG TOTAL) BY MOUTH DAILY.   omeprazole 20 MG capsule Commonly known as: PRILOSEC TAKE 1 CAPSULE (20 MG TOTAL) BY MOUTH DAILY.   OVER THE COUNTER MEDICATION Take 1 capsule by mouth at bedtime.   topiramate 25 MG tablet Commonly known as: Topamax Take 1 tablet (25 mg total) by mouth at bedtime as needed.   TRUEplus Insulin Syringe 31G X 5/16" 0.3 ML Misc Generic drug: Insulin Syringe-Needle U-100 Use to inject insulin twice daily   TRUEplus Pen Needles 32G X 4 MM Misc Generic drug: Insulin Pen Needle Use to inject insulin twice daily.          REVIEW OF SYSTEMS: A comprehensive ROS was conducted with the patient and is negative except as per HPI    OBJECTIVE:   VS: BP 136/88 (BP Location: Left Arm, Patient Position: Sitting, Cuff Size: Large)   Pulse 78   Ht 5' 3"  (1.6 m)   Wt 199 lb 9.6 oz (90.5 kg)   SpO2 99%   BMI 35.36 kg/m    Wt Readings from Last 3 Encounters:  11/30/21 199 lb 9.6 oz (90.5 kg)  09/08/21 199 lb 4.8 oz (90.4 kg)  03/30/21 205 lb (93 kg)     EXAM: General: Chelsea Coleman appears well and is in NAD  Neck: General: Supple without adenopathy. Thyroid: Thyroid size normal.  No goiter or nodules appreciated. No thyroid bruit.  Lungs: Clear with good BS bilat with no rales, rhonchi, or wheezes  Heart: Auscultation: RRR.  Abdomen: Normoactive bowel sounds, soft, nontender, without masses or organomegaly palpable  Extremities:  BL LE: No pretibial edema normal ROM and strength.  Mental Status: Judgment, insight: Intact Orientation: Oriented to time, place, and person Mood and affect: No depression, anxiety, or agitation     DATA REVIEWED: ***   DXA 04/20/2021  Site Region Measured Date Measured Age YA BMD Significant CHANGE T-score Left Forearm Radius 33% 04/20/2021 66.5 -5.0 0.444 g/cm2   DualFemur Neck Right 04/20/2021 66.5 -2.7 0.660 g/cm2   DualFemur Total Mean 04/20/2021 66.5 -2.3 0.720 g/cm2    ASSESSMENT/PLAN/RECOMMENDATIONS:   Osteoporosis  - Multifactorial given menopause in her 40's, low calcium intake, chronic PPI use, and CKD III - Emphasized the importance of optimizing calcium ad vitamin D intake for bone health  - Most of the time was spent discussing how Chelsea Coleman is unable to take calcium tablets due to constipation, we discussed options of calcium rich diet such as green leafy vegetables and low-fat dairy -We will proceed with ruling out secondary causes to include TFTs, PTH, 24-hour urine collection for calcium and cortisol -Limited antiresorptive agents due to CKD III -We will consider options to include Prolia versus anabolic agents depending on lab results  Medications : Calcium 1200 mg daily (  equivalent 3 servings of calcium rich dairy )     F/U in 2 months   Signed electronically by: Mack Guise, MD  [facility] Endocrinology  [facility] [address] [place], [place] [address] Phone: [phone] FAX: [phone]   CC: Ladell Pier, MD [address] [place] [address] Phone: [phone] Fax: [phone]   Return to Endocrinology clinic as below: Future Appointments  Date Time Provider [place]  11/30/2021  2:20 PM Ailie Gage, Melanie Crazier, MD [facility] None  12/28/2021  9:10 AM Ladell Pier, MD [facility] None  03/08/2022  1:45 PM [facility]  None  03/08/2022  2:20 PM Orson Slick, MD [facility] None

## 2021-11-30 NOTE — Patient Instructions (Signed)
24-Hour Urine Collection  You will be collecting your urine for a 24-hour period of time. Your timer starts with your first urine of the morning (For example - If you first pee at [place], your timer will start at [place]) [place] away your first urine of the morning Collect your urine every time you pee for the next 24 hours STOP your urine collection 24 hours after you started the collection (For example - You would stop at 9AM the day after you started)   Recoleccin de orina de 24 horas  ? Recoger su orina durante un perodo de 24 horas. ? Su temporizador comienza con su primera orina de la maana (por ejemplo, si orina por primera vez a las 9 a.m., su Actor a las 9 a.m.) ? Bote su primera orina de la [place] ? Recoja su orina cada vez que orine durante las prximas 24 horas DETENGA su recoleccin de orina 24 horas despus de que comenz la recoleccin (por ejemplo, se detendra a las 9 a.m. del da despus de que comenz)

## 2021-12-02 ENCOUNTER — Encounter: Payer: Self-pay | Admitting: Internal Medicine

## 2021-12-02 DIAGNOSIS — N2581 Secondary hyperparathyroidism of renal origin: Secondary | ICD-10-CM | POA: Insufficient documentation

## 2021-12-03 LAB — CALCIUM, IONIZED: Calcium, Ion: 4.8 mg/dL (ref 4.7–5.5)

## 2021-12-03 LAB — ALKALINE PHOSPHATASE, BONE SPECIFIC: ALKALINE PHOSPHATASE, BONE SPECIFIC: 17.2 mcg/L (ref 5.6–29.0)

## 2021-12-03 LAB — PARATHYROID HORMONE, INTACT (NO CA): PTH: 86 pg/mL — ABNORMAL HIGH (ref 16–77)

## 2021-12-07 ENCOUNTER — Other Ambulatory Visit: Payer: Self-pay | Admitting: Internal Medicine

## 2021-12-07 ENCOUNTER — Other Ambulatory Visit: Payer: Medicare Other

## 2021-12-07 ENCOUNTER — Other Ambulatory Visit: Payer: Self-pay

## 2021-12-07 DIAGNOSIS — Z794 Long term (current) use of insulin: Secondary | ICD-10-CM | POA: Diagnosis not present

## 2021-12-07 DIAGNOSIS — E1142 Type 2 diabetes mellitus with diabetic polyneuropathy: Secondary | ICD-10-CM

## 2021-12-07 DIAGNOSIS — Z961 Presence of intraocular lens: Secondary | ICD-10-CM | POA: Diagnosis not present

## 2021-12-07 DIAGNOSIS — E113593 Type 2 diabetes mellitus with proliferative diabetic retinopathy without macular edema, bilateral: Secondary | ICD-10-CM | POA: Diagnosis not present

## 2021-12-07 DIAGNOSIS — M810 Age-related osteoporosis without current pathological fracture: Secondary | ICD-10-CM

## 2021-12-07 DIAGNOSIS — H5213 Myopia, bilateral: Secondary | ICD-10-CM | POA: Diagnosis not present

## 2021-12-07 LAB — HM DIABETES EYE EXAM

## 2021-12-08 ENCOUNTER — Other Ambulatory Visit: Payer: Self-pay

## 2021-12-08 MED ORDER — DAPAGLIFLOZIN PROPANEDIOL 5 MG PO TABS
5.0000 mg | ORAL_TABLET | Freq: Every day | ORAL | 0 refills | Status: DC
  Filled 2021-12-08 – 2021-12-09 (×2): qty 30, 30d supply, fill #0

## 2021-12-08 MED ORDER — LOSARTAN POTASSIUM 50 MG PO TABS
ORAL_TABLET | Freq: Every day | ORAL | 0 refills | Status: DC
  Filled 2021-12-08: qty 90, 90d supply, fill #0

## 2021-12-09 ENCOUNTER — Other Ambulatory Visit: Payer: Self-pay

## 2021-12-10 ENCOUNTER — Other Ambulatory Visit: Payer: Self-pay

## 2021-12-11 LAB — CORTISOL, URINE, 24 HOUR
24 Hour urine volume (VMAHVA): 4000 mL
CREATININE, URINE: 1.32 g/(24.h) (ref 0.50–2.15)
Cortisol (Ur), Free: 11.9 mcg/24 h (ref 4.0–50.0)

## 2021-12-11 LAB — CALCIUM, URINE, 24 HOUR: Calcium, 24H Urine: 56 mg/24 h

## 2021-12-16 ENCOUNTER — Telehealth: Payer: Self-pay | Admitting: Internal Medicine

## 2021-12-16 NOTE — Telephone Encounter (Signed)
I have attempted to contact the patient on 12/16/2021 at 1600 through interpreter line    There was no answer and a voicemail was left for the patient to contact us to discuss osteoporosis management   Evenity will be the first option followed by Prolia  Not a candidate for PTH analogs due to elevated PTH Not a candidate for bisphosphonate due to low GFR  We will be awaiting for callback

## 2021-12-17 ENCOUNTER — Encounter: Payer: Self-pay | Admitting: Internal Medicine

## 2021-12-18 ENCOUNTER — Other Ambulatory Visit: Payer: Self-pay

## 2021-12-21 ENCOUNTER — Other Ambulatory Visit: Payer: Self-pay

## 2021-12-22 NOTE — Telephone Encounter (Signed)
Attempted to call the pt on 12/22/2021 at 3 3 6-3 to 4-7 [phone] about noon without answer voicemail was left for the patient to call us back    Attempted to call a second number listed on her [phone] but it was an incorrect number     Abby Nena Jordan, MD  [facility] Endocrinology  [facility] [address] [place], [place] [address] Phone: [phone] FAX: [phone]

## 2021-12-22 NOTE — Telephone Encounter (Signed)
Patient returned call regarding Evenity - used interpretor service to advise patient that provider will need to contact patient - # [phone]

## 2021-12-25 ENCOUNTER — Telehealth: Payer: Self-pay | Admitting: Internal Medicine

## 2021-12-25 NOTE — Telephone Encounter (Signed)
Spoke to the patient through the interpreter line on 12/25/2021 at 11:30 AM   We discussed her most recent lab results and given the severity of osteoporosis I have recommended that we start her on Evenity   We discussed side effects such as local skin reaction, arthralgias, as muscle spasms    No recent CVA or cardiovascular disease  Patient in agreement of this  This message will be forwarded to our [place] coordinator to start the process of authorization through her insurance company     [place], MD  [facility] Endocrinology  [facility] [address] [place], [place] [address] Phone: [place]: [phone]

## 2021-12-26 NOTE — Telephone Encounter (Signed)
[facility]  VOB initiated via [email or web address]  [facility] - [facility] START

## 2021-12-28 ENCOUNTER — Other Ambulatory Visit: Payer: Self-pay

## 2021-12-28 ENCOUNTER — Encounter: Payer: Self-pay | Admitting: Internal Medicine

## 2021-12-28 ENCOUNTER — Ambulatory Visit: Payer: Medicare Other | Attending: Internal Medicine | Admitting: Internal Medicine

## 2021-12-28 VITALS — BP 131/81 | HR 99 | Wt 198.6 lb

## 2021-12-28 DIAGNOSIS — N2581 Secondary hyperparathyroidism of renal origin: Secondary | ICD-10-CM | POA: Diagnosis not present

## 2021-12-28 DIAGNOSIS — E1169 Type 2 diabetes mellitus with other specified complication: Secondary | ICD-10-CM | POA: Diagnosis not present

## 2021-12-28 DIAGNOSIS — Z23 Encounter for immunization: Secondary | ICD-10-CM

## 2021-12-28 DIAGNOSIS — N3946 Mixed incontinence: Secondary | ICD-10-CM

## 2021-12-28 DIAGNOSIS — M170 Bilateral primary osteoarthritis of knee: Secondary | ICD-10-CM | POA: Diagnosis not present

## 2021-12-28 DIAGNOSIS — E1159 Type 2 diabetes mellitus with other circulatory complications: Secondary | ICD-10-CM

## 2021-12-28 DIAGNOSIS — M810 Age-related osteoporosis without current pathological fracture: Secondary | ICD-10-CM | POA: Diagnosis not present

## 2021-12-28 DIAGNOSIS — Z8673 Personal history of transient ischemic attack (TIA), and cerebral infarction without residual deficits: Secondary | ICD-10-CM

## 2021-12-28 DIAGNOSIS — G43109 Migraine with aura, not intractable, without status migrainosus: Secondary | ICD-10-CM | POA: Diagnosis not present

## 2021-12-28 DIAGNOSIS — Z1231 Encounter for screening mammogram for malignant neoplasm of breast: Secondary | ICD-10-CM

## 2021-12-28 DIAGNOSIS — E785 Hyperlipidemia, unspecified: Secondary | ICD-10-CM

## 2021-12-28 DIAGNOSIS — I152 Hypertension secondary to endocrine disorders: Secondary | ICD-10-CM | POA: Diagnosis not present

## 2021-12-28 DIAGNOSIS — E1142 Type 2 diabetes mellitus with diabetic polyneuropathy: Secondary | ICD-10-CM

## 2021-12-28 DIAGNOSIS — Z6835 Body mass index (BMI) 35.0-35.9, adult: Secondary | ICD-10-CM

## 2021-12-28 LAB — GLUCOSE, POCT (MANUAL RESULT ENTRY): POC Glucose: 156 mg/dl — AB (ref 70–99)

## 2021-12-28 LAB — POCT GLYCOSYLATED HEMOGLOBIN (HGB A1C): HbA1c, POC (controlled diabetic range): 8.4 % — AB (ref 0.0–7.0)

## 2021-12-28 MED ORDER — ATORVASTATIN CALCIUM 20 MG PO TABS
ORAL_TABLET | Freq: Every day | ORAL | 1 refills | Status: DC
  Filled 2021-12-28: qty 90, 90d supply, fill #0
  Filled 2022-04-09: qty 90, 90d supply, fill #1

## 2021-12-28 MED ORDER — LOSARTAN POTASSIUM 50 MG PO TABS
ORAL_TABLET | Freq: Every day | ORAL | 2 refills | Status: DC
  Filled 2021-12-28 – 2022-03-10 (×2): qty 90, 90d supply, fill #0
  Filled 2022-07-06: qty 90, 90d supply, fill #1
  Filled 2022-10-04: qty 90, 90d supply, fill #2

## 2021-12-28 MED ORDER — SOLIFENACIN SUCCINATE 5 MG PO TABS
5.0000 mg | ORAL_TABLET | Freq: Every day | ORAL | 2 refills | Status: DC
  Filled 2021-12-28: qty 30, 30d supply, fill #0

## 2021-12-28 MED ORDER — FREESTYLE LIBRE SENSOR SYSTEM MISC
12 refills | Status: DC
  Filled 2021-12-28: qty 2, 28d supply, fill #0
  Filled 2022-04-09: qty 2, 28d supply, fill #1

## 2021-12-28 MED ORDER — ASPIRIN 81 MG PO TBEC: 81.0000 mg | DELAYED_RELEASE_TABLET | Freq: Every day | ORAL | 1 refills | Status: AC

## 2021-12-28 MED ORDER — GABAPENTIN 400 MG PO CAPS
ORAL_CAPSULE | ORAL | 2 refills | Status: DC
  Filled 2021-12-28: qty 180, 90d supply, fill #0

## 2021-12-28 MED ORDER — FREESTYLE LIBRE READER DEVI
0 refills | Status: DC
  Filled 2021-12-28: qty 1, 28d supply, fill #0

## 2021-12-28 MED ORDER — DAPAGLIFLOZIN PROPANEDIOL 5 MG PO TABS
5.0000 mg | ORAL_TABLET | Freq: Every day | ORAL | 6 refills | Status: DC
  Filled 2021-12-28: qty 90, 90d supply, fill #0
  Filled 2022-01-11: qty 30, 30d supply, fill #0
  Filled 2022-02-22: qty 30, 30d supply, fill #1
  Filled 2022-03-30: qty 90, 90d supply, fill #2
  Filled 2022-07-06: qty 60, 60d supply, fill #3

## 2021-12-28 MED ORDER — AMLODIPINE BESYLATE 5 MG PO TABS
7.5000 mg | ORAL_TABLET | Freq: Every day | ORAL | 2 refills | Status: DC
  Filled 2021-12-28: qty 135, 90d supply, fill #0
  Filled 2022-04-09: qty 135, 90d supply, fill #1
  Filled 2022-07-06: qty 135, 90d supply, fill #2

## 2021-12-28 NOTE — Progress Notes (Signed)
Patient ID: Chelsea Coleman, female    DOB: 04/16/1956  MRN: [id number]  CC: Chronic disease management  Subjective: Chelsea Coleman is a 66 y.o. female who presents for chronic disease management Her concerns today include:  Pt with hx of DM neuropathy and retinopathy, HTN, HL, obesity, anemia of chronic ds, thrombocytosis, CKD 3b, ACD, Vit D, osteoporosis.   Osteoporosis: Saw Dr. Leonette Monarch 11/30/2021.  Not a good candidate for med like Fosmax due to CKD.  Plan to start on Evenity; her office is currently going through the prior auth process. -taking Vit D OTC but not Ca+ supplement because it causes constipation.  Advised of foods that are high in Ca+ like green leafy veggies and low fat dairy Patient also has hyperparathyroidism with most recent PTH level of 86.  Endocrinologist thinks this is secondary PTH associated with CKD.  Leukocytosis/thrombocytosis: Saw hematology PA in follow-up 08/19/2021.  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  Numbers have improved.  Plan to continue to monitor with follow-up in 6 months.  DM: Results for orders placed or performed in visit on 12/28/21  POCT glucose (manual entry)  Result Value Ref Range   POC Glucose 156 (A) 70 - 99 mg/dl  POCT glycosylated hemoglobin (Hb A1C)  Result Value Ref Range   Hemoglobin A1C     HbA1c POC (<> result, manual entry)     HbA1c, POC (prediabetic range)     HbA1c, POC (controlled diabetic range) 8.4 (A) 0.0 - 7.0 %  A1C increased from 7.4 Confirms taking Humulin 70/30 20 a.m/10 p.m and Farxiga 5 mg daily consistently Checks BS about 3 x/wk.  Gives range 120-150 Doing good with eating habits. Does not do much walking due to pain in the knees.  Ambulates with a cane.  Requests that I complete form to allow her to get handicap sticker.  HTN/CKD 3: Reports compliance with Norvasc 7.5 mg and Cozaar 50 mg daily. Limits salt in foods. Follows with Dr. Royce Macadamia.  Sees her Q 55mhs.  Recent  creatinine 1.76, GFR 29.6. Range 28-39 over past 2 yrs  HL: Reports compliance with Lipitor but out x 1 wk.  Migraines:  c/o HA on last visit.  Dx with migraines.  Started on Topamax for prophylaxis and told to continue Tylenol for abortive therapy. Reports frequency has decreased significantly.  Now occurs only occasionally. 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  Takes ASA but not consistently -referred to neurology.  They tried reaching her several times to schedule using interpreter and left [facility]  [place].  Started having leakage of urine again and enuresis since May.  Not able to hold urine when she has urge to urinate.  Endorses leakage when she coughs and laughs -cuts up cloth and use it for incontinence supply -no dysuria  Patient Active Problem List   Diagnosis Date Noted   Secondary hyperparathyroidism ([place] 12/02/2021   Age related osteoporosis 11/30/2021   Osteoporosis of multiple sites 04/21/2021   Leukocytosis 11/18/2020   Thrombocytosis 11/18/2020   Hypertension associated with stage 3 chronic kidney disease due to type 2 diabetes mellitus ([place] 11/03/2020   Obesity (BMI 35.0-39.9 without comorbidity) 11/03/2020   Stage 3b chronic kidney disease ([place] 04/06/2019   Hyperlipidemia associated with type 2 diabetes mellitus ([place] 04/06/2019   Gastroesophageal reflux disease without esophagitis 04/06/2019   Type 2 diabetes, controlled, with peripheral neuropathy ([place] 01/26/2019  Diabetic retinopathy associated with controlled type 2 diabetes mellitus ([place]) 01/26/2019   Essential hypertension 01/26/2019   Anemia 01/26/2019   Drug-induced constipation 01/26/2019   Memory difficulties 01/26/2019   Positive depression screening 01/26/2019     Current Outpatient Medications on File Prior to Visit  Medication Sig Dispense Refill   Accu-Chek FastClix Lancets MISC Use to check blood sugar 3 TIMES  DAILY. (Patient taking differently: Use to check blood sugar 3 TIMES DAILY.) 100 each 2   Blood Glucose Monitoring Suppl (ACCU-CHEK GUIDE ME) w/Device KIT Use to check blood sugar 3 TIMES DAILY. (Patient taking differently: Use to check blood sugar 3 TIMES DAILY.) 1 kit 0   clobetasol (TEMOVATE) 0.05 % external solution APPLY SMALL AMOUNT TO AFFECTED AREA 3 TIMES A WEEK AS NEEDED. 50 mL 1   glucose blood (ACCU-CHEK GUIDE) test strip Use to check blood sugar 3 TIMES DAILY. (Patient taking differently: Use to check blood sugar 3 TIMES DAILY.) 100 each 2   insulin NPH-regular Human (HUMULIN 70/30) (70-30) 100 UNIT/ML injection Inject 20 units in morning with breakfast and 10 units with dinner 10 mL 2   Insulin Pen Needle (TRUEPLUS PEN NEEDLES) 32G X 4 MM MISC Use to inject insulin twice daily. 100 each 2   Insulin Syringe-Needle U-100 31G X 5/16" 0.3 ML MISC Use to inject insulin twice daily 100 each 2   omeprazole (PRILOSEC) 20 MG capsule TAKE 1 CAPSULE (20 MG TOTAL) BY MOUTH DAILY. 30 capsule 3   OVER THE COUNTER MEDICATION Take 1 capsule by mouth at bedtime.     topiramate (TOPAMAX) 25 MG tablet Take 1 tablet (25 mg total) by mouth at bedtime as needed. 30 tablet 1   No current facility-administered medications on file prior to visit.    No Known Allergies  Social History   Socioeconomic History   Marital status: Married    Spouse name: Not on file   Number of children: 1   Years of education: 12 grade   Highest education level: Not on file  Occupational History   Occupation: unemployed  Tobacco Use   Smoking status: Never   Smokeless tobacco: Never  Vaping Use   Vaping Use: Never used  Substance and Sexual Activity   Alcohol use: Never   Drug use: Never   Sexual activity: Not on file  Other Topics Concern   Not on file  Social History Narrative   Not on file   Social Determinants of Health   Financial Resource Strain: Not on file  Food Insecurity: Not on file   Transportation Needs: Not on file  Physical Activity: Not on file  Stress: Not on file  Social Connections: Not on file  Intimate Partner Violence: Not on file    Family History  Problem Relation Age of Onset   Diabetes Mother    Colon cancer Neg Hx    Colon polyps Neg Hx    Esophageal cancer Neg Hx    Rectal cancer Neg Hx    Stomach cancer Neg Hx     Past Surgical History:  Procedure Laterality Date   ABDOMINAL HYSTERECTOMY     fibroid   CHOLECYSTECTOMY     REFRACTIVE SURGERY  2017   BL    ROS: Review of Systems Negative except as stated above  PHYSICAL EXAM: BP 131/81   Pulse 99   Wt 198 lb 9.6 oz (90.1 kg)   SpO2 95%   BMI 35.18 kg/m   Wt Readings from Last 3  Encounters:  12/28/21 198 lb 9.6 oz (90.1 kg)  11/30/21 199 lb 9.6 oz (90.5 kg)  09/08/21 199 lb 4.8 oz (90.4 kg)    Physical Exam Ambulates with cane. General appearance - alert, well appearing, obese elderly Hispanic female and in no distress Mental status - normal mood, behavior, speech, dress, motor activity, and thought processes Eyes -pink conjunctiva Mouth - mucous membranes moist, pharynx normal without lesions Neck - supple, no significant adenopathy Chest - clear to auscultation, no wheezes, rales or rhonchi, symmetric air entry Heart - normal rate, regular rhythm, normal S1, S2, no murmurs, rubs, clicks or gallops Extremities -no lower extremity edema Knees: Joints are enlarged.  No point tenderness.  Slow passive range of motion.  She requires hands-on assistance with getting on the exam table.      Latest Ref Rng & Units 11/30/2021    2:29 PM 09/08/2021   12:24 PM 03/09/2021   10:01 AM  CMP  Glucose 70 - 99 mg/dL 220  125  152   BUN 6 - 23 mg/dL 17  24  23    Creatinine 0.40 - 1.20 mg/dL 1.76  1.47  1.56   Sodium 135 - 145 mEq/L 132  136  137   Potassium 3.5 - 5.1 mEq/L 4.2  4.2  4.1   Chloride 96 - 112 mEq/L 98  104  104   CO2 19 - 32 mEq/L 27  26  25    Calcium 8.4 - 10.5 mg/dL  9.2  9.0  9.1   Total Protein 6.0 - 8.3 g/dL 7.9  7.7  7.9   Total Bilirubin 0.2 - 1.2 mg/dL 0.2  0.3  0.3   Alkaline Phos 39 - 117 U/L 100  99  102   AST 0 - 37 U/L 16  12  14    ALT 0 - 35 U/L 11  9  8     Lipid Panel     Component Value Date/Time   CHOL 161 11/03/2020 1104   TRIG 120 11/03/2020 1104   HDL 62 11/03/2020 1104   CHOLHDL 2.6 11/03/2020 1104   LDLCALC 78 11/03/2020 1104    CBC    Component Value Date/Time   WBC 10.7 (H) 09/08/2021 1224   RBC 3.92 09/08/2021 1224   HGB 11.7 (L) 09/08/2021 1224   HGB 11.5 11/03/2020 1104   HCT 35.5 (L) 09/08/2021 1224   HCT 35.2 11/03/2020 1104   PLT 461 (H) 09/08/2021 1224   PLT 541 (H) 11/03/2020 1104   MCV 90.6 09/08/2021 1224   MCV 90 11/03/2020 1104   MCH 29.8 09/08/2021 1224   MCHC 33.0 09/08/2021 1224   RDW 13.1 09/08/2021 1224   RDW 12.7 11/03/2020 1104   LYMPHSABS 1.7 09/08/2021 1224   MONOABS 0.5 09/08/2021 1224   EOSABS 0.3 09/08/2021 1224   BASOSABS 0.1 09/08/2021 1224    ASSESSMENT AND PLAN: 1. Type 2 diabetes mellitus with peripheral neuropathy (HCC) A1c is not at goal. I do not have enough information in terms of her blood sugars to adjust her insulin.  Recommend that she check blood sugars at least twice a day and record the readings.  Bring those readings when she comes to see me in 1 month for recheck.  As a matter of fact, we will try to get her the [place] device.  Advised that if it is covered by her insurance and she needs help in setting it up once she gets it, please let me know so that I can  have her meet with the clinical pharmacist. Continue current dose of Humulin 70/30 20 units in the a.m. and 10 units in the p.m. and Farxiga. Continue healthy eating habits. Try to move this much as her knees will allow. - POCT glucose (manual entry) - POCT glycosylated hemoglobin (Hb A1C) - Microalbumin / creatinine urine ratio - Continuous Blood Gluc Sensor (FREESTYLE LIBRE SENSOR SYSTEM) MISC; Change sensor  every 2 weeks (14 days)  Dispense: 2 each; Refill: 12 - Continuous Blood Gluc Receiver (FREESTYLE LIBRE READER) DEVI; use as directed  Dispense: 1 each; Refill: 0 - gabapentin (NEURONTIN) 400 MG capsule; TAKE 1 CAPSULE (400 MG TOTAL) BY MOUTH 2 (TWO) TIMES DAILY.  Dispense: 180 capsule; Refill: 2 - losartan (COZAAR) 50 MG tablet; TAKE 1 TABLET (50 MG TOTAL) BY MOUTH DAILY.  Dispense: 90 tablet; Refill: 2 - dapagliflozin propanediol (FARXIGA) 5 MG TABS tablet; Take 1 tablet (5 mg total) by mouth daily before breakfast.  Dispense: 30 tablet; Refill: 6  2. Hypertension associated with diabetes ([place]) Close to goal.  Continue Cozaar 50 mg and Norvasc 7.5 mg daily. - amLODipine (NORVASC) 5 MG tablet; Take 1.5 tablets (7.5 mg total) by mouth daily.  Dispense: 135 tablet; Refill: 2  3. Hyperlipidemia associated with type 2 diabetes mellitus (HCC) Continue atorvastatin.  Due for lipid profile check. - Lipid panel - atorvastatin (LIPITOR) 20 MG tablet; TAKE 1 TABLET (20 MG TOTAL) BY MOUTH DAILY.  Dispense: 90 tablet; Refill: 1  4. Class 2 severe obesity with serious comorbidity and body mass index (BMI) of 35.0 to 35.9 in adult, unspecified obesity type ([place]) See #1 above.  5. Mixed stress and urge urinary incontinence Discussed management of incontinence. She is willing to try a low-dose of Vesicare. She is agreeable for [place] to send request to [facility] for incontinence supplies. - solifenacin (VESICARE) 5 MG tablet; Take 1 tablet (5 mg total) by mouth daily.  Dispense: 90 tablet; Refill: 2  6. Migraine with aura and without status migrainosus, not intractable Doing much better on Topamax. Advised taking a baby aspirin daily given findings on MRI showing old bilateral cerebellar infarcts.  7. Osteoarthritis of both knees, unspecified osteoarthritis type Form completed for her to have handicap sticker  8. Age-related osteoporosis without current pathological fracture 9. Secondary  hyperparathyroidism ([place]) Followed by endocrinology.  They are awaiting approval by her insurance for Evenity  10. Need for vaccination against Streptococcus pneumoniae Patient agreeable to receiving PCV 20 today. - Pneumococcal conjugate vaccine 20-valent  11. Encounter for screening mammogram for malignant neoplasm of breast - MM Digital Screening; Future  12.  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  [employer] Language interpreter used during this encounter. #[id number], [place]   Patient was given the opportunity to ask questions.  Patient verbalized understanding of the plan and was able to repeat key elements of the plan.   This documentation was completed using Radio producer.  Any transcriptional errors are unintentional.  Orders Placed This Encounter  Procedures   MM Digital Screening   Pneumococcal conjugate vaccine 20-valent   Lipid panel   Microalbumin / creatinine urine ratio   POCT glucose (manual entry)   POCT glycosylated hemoglobin (Hb A1C)     Requested Prescriptions   Signed Prescriptions Disp Refills   Continuous Blood Gluc Sensor (FREESTYLE LIBRE SENSOR SYSTEM) MISC 2 each 12    Sig: Change sensor every 2 weeks (14 days)   Continuous Blood Gluc Receiver (  FREESTYLE LIBRE READER) DEVI 1 each 0    Sig: use as directed   gabapentin (NEURONTIN) 400 MG capsule 180 capsule 2    Sig: TAKE 1 CAPSULE (400 MG TOTAL) BY MOUTH 2 (TWO) TIMES DAILY.   amLODipine (NORVASC) 5 MG tablet 135 tablet 2    Sig: Take 1.5 tablets (7.5 mg total) by mouth daily.   atorvastatin (LIPITOR) 20 MG tablet 90 tablet 1    Sig: TAKE 1 TABLET (20 MG TOTAL) BY MOUTH DAILY.   losartan (COZAAR) 50 MG tablet 90 tablet 2    Sig: TAKE 1 TABLET (50 MG TOTAL) BY MOUTH DAILY.   dapagliflozin propanediol (FARXIGA) 5 MG TABS tablet 30 tablet 6    Sig: Take 1 tablet (5 mg total) by mouth daily before breakfast.   solifenacin (VESICARE) 5 MG  tablet 90 tablet 2    Sig: Take 1 tablet (5 mg total) by mouth daily.    Return in about 1 month (around 01/28/2022) for Give appt with Lurena Joiner after 02/23/2022 for Medicare Wellness.  Karle Plumber, MD, FACP

## 2021-12-29 ENCOUNTER — Other Ambulatory Visit: Payer: Self-pay | Admitting: Internal Medicine

## 2021-12-29 DIAGNOSIS — E1142 Type 2 diabetes mellitus with diabetic polyneuropathy: Secondary | ICD-10-CM

## 2021-12-29 LAB — LIPID PANEL
Chol/HDL Ratio: 2.9 ratio (ref 0.0–4.4)
Cholesterol, Total: 166 mg/dL (ref 100–199)
HDL: 58 mg/dL (ref >39)
LDL Chol Calc (NIH): 86 mg/dL (ref 0–99)
Triglycerides: 123 mg/dL (ref 0–149)
VLDL Cholesterol Cal: 22 mg/dL (ref 5–40)

## 2021-12-29 LAB — MICROALBUMIN / CREATININE URINE RATIO
Creatinine, Urine: 109.6 mg/dL
Microalb/Creat Ratio: 12 mg/g creat (ref 0–29)
Microalbumin, Urine: 13 ug/mL

## 2022-01-04 NOTE — Telephone Encounter (Signed)
Prior auth required for EVENITY  PA PROCESS DETAILS: Prior Authorization is Required. We are unable to confirm if a PA is on file. Please check your records or contact the payer.  

## 2022-01-07 ENCOUNTER — Telehealth: Payer: Self-pay | Admitting: Emergency Medicine

## 2022-01-07 MED ORDER — OXYBUTYNIN CHLORIDE ER 5 MG PO TB24
5.0000 mg | ORAL_TABLET | Freq: Every day | ORAL | 3 refills | Status: DC
  Filled 2022-01-07: qty 30, 30d supply, fill #0
  Filled 2022-03-10: qty 30, 30d supply, fill #1
  Filled 2022-04-09: qty 30, 30d supply, fill #2

## 2022-01-07 NOTE — Addendum Note (Signed)
Addended by: Karle Plumber B on: 01/07/2022 09:03 PM   Modules accepted: Orders

## 2022-01-07 NOTE — Telephone Encounter (Signed)
Pt is needing replacement medication sent to pharmacy.

## 2022-01-07 NOTE — Telephone Encounter (Signed)
Prior Authorization initiated for [facility] via [facility] Provider portal.  Case ID: [id number]

## 2022-01-07 NOTE — Telephone Encounter (Signed)
Copied from [place] [phone]. Topic: General - Other >> Jan 07, 2022 12:40 PM Everette C wrote: Reason for CRM: The patient has been directed by their insurance provider to contact their PCP and request an alternative prescription for Rx #: [id number]  solifenacin (VESICARE) 5 MG tablet [[id number]]   The patient is unable to cover the cost of the medication out of pocket   The patient was instructed to provide the phone number [phone] for the patient's PCP to speak with a member of staff regarding their medication and a potential  alternative   Please contact further when possible

## 2022-01-08 ENCOUNTER — Other Ambulatory Visit: Payer: Self-pay

## 2022-01-11 ENCOUNTER — Other Ambulatory Visit: Payer: Self-pay

## 2022-01-13 NOTE — Telephone Encounter (Signed)
Pt ready for scheduling on or after 01/13/22  Out-of-pocket cost due at time of visit: $438  Primary: [facility] Medicare Evenity co-insurance: 20% (approximately $418) Admin fee co-insurance: $20  Secondary: n/a Evenity co-insurance:  Admin fee co-insurance:   Deductible: does not apply  Prior Auth: APPROVED PA# [id number] Valid: 01/07/22-01/08/23  ** This summary of benefits is an estimation of the patient's out-of-pocket cost. Exact cost may vary based on individual plan coverage.

## 2022-01-13 NOTE — Telephone Encounter (Addendum)
PA# [id number] Valid: 01/07/22-01/08/23

## 2022-01-14 ENCOUNTER — Other Ambulatory Visit: Payer: Self-pay

## 2022-01-14 NOTE — Telephone Encounter (Signed)
Patient was called and informed of medication change

## 2022-01-15 ENCOUNTER — Telehealth: Payer: Self-pay

## 2022-01-15 NOTE — Telephone Encounter (Signed)
Using Spanish interpreter No Name # [phone], pt. Given results and instructions. Verbalizes understanding.

## 2022-01-18 ENCOUNTER — Ambulatory Visit
Admission: RE | Admit: 2022-01-18 | Discharge: 2022-01-18 | Disposition: A | Discharge status: Home / Self Care | Payer: Medicare Other | Source: Ambulatory Visit | Attending: Internal Medicine | Admitting: Internal Medicine

## 2022-01-18 DIAGNOSIS — Z1231 Encounter for screening mammogram for malignant neoplasm of breast: Secondary | ICD-10-CM

## 2022-02-01 DIAGNOSIS — N1832 Chronic kidney disease, stage 3b: Secondary | ICD-10-CM | POA: Diagnosis not present

## 2022-02-08 DIAGNOSIS — H35033 Hypertensive retinopathy, bilateral: Secondary | ICD-10-CM | POA: Diagnosis not present

## 2022-02-08 DIAGNOSIS — E113513 Type 2 diabetes mellitus with proliferative diabetic retinopathy with macular edema, bilateral: Secondary | ICD-10-CM | POA: Diagnosis not present

## 2022-02-08 DIAGNOSIS — E1129 Type 2 diabetes mellitus with other diabetic kidney complication: Secondary | ICD-10-CM | POA: Diagnosis not present

## 2022-02-08 DIAGNOSIS — H31093 Other chorioretinal scars, bilateral: Secondary | ICD-10-CM | POA: Diagnosis not present

## 2022-02-08 DIAGNOSIS — R809 Proteinuria, unspecified: Secondary | ICD-10-CM | POA: Diagnosis not present

## 2022-02-08 DIAGNOSIS — E1122 Type 2 diabetes mellitus with diabetic chronic kidney disease: Secondary | ICD-10-CM | POA: Diagnosis not present

## 2022-02-08 DIAGNOSIS — N1832 Chronic kidney disease, stage 3b: Secondary | ICD-10-CM | POA: Diagnosis not present

## 2022-02-08 DIAGNOSIS — D631 Anemia in chronic kidney disease: Secondary | ICD-10-CM | POA: Diagnosis not present

## 2022-02-08 DIAGNOSIS — I129 Hypertensive chronic kidney disease with stage 1 through stage 4 chronic kidney disease, or unspecified chronic kidney disease: Secondary | ICD-10-CM | POA: Diagnosis not present

## 2022-02-08 DIAGNOSIS — E875 Hyperkalemia: Secondary | ICD-10-CM | POA: Diagnosis not present

## 2022-02-08 DIAGNOSIS — H43813 Vitreous degeneration, bilateral: Secondary | ICD-10-CM | POA: Diagnosis not present

## 2022-02-10 ENCOUNTER — Other Ambulatory Visit: Payer: Self-pay

## 2022-02-22 ENCOUNTER — Other Ambulatory Visit: Payer: Self-pay

## 2022-02-22 ENCOUNTER — Other Ambulatory Visit: Payer: Self-pay | Admitting: Internal Medicine

## 2022-02-22 DIAGNOSIS — E1142 Type 2 diabetes mellitus with diabetic polyneuropathy: Secondary | ICD-10-CM

## 2022-02-23 MED ORDER — HUMULIN 70/30 (70-30) 100 UNIT/ML ~~LOC~~ SUSP
SUBCUTANEOUS | 5 refills | Status: DC
  Filled 2022-02-23: qty 10, 31d supply, fill #0

## 2022-02-23 NOTE — Telephone Encounter (Signed)
Requested Prescriptions  Pending Prescriptions Disp Refills  . insulin NPH-regular Human (HUMULIN 70/30) (70-30) 100 UNIT/ML injection 10 mL 5    Sig: Inject 20 units in morning with breakfast and 10 units with dinner     Endocrinology:  Diabetes - Insulins Failed - 02/22/2022  1:35 PM      Failed - HBA1C is between 0 and 7.9 and within 180 days    HbA1c, POC (controlled diabetic range)  Date Value Ref Range Status  12/28/2021 8.4 (A) 0.0 - 7.0 % Final         Passed - Valid encounter within last 6 months    Recent Outpatient Visits          1 month ago Type 2 diabetes mellitus with peripheral neuropathy ([place])   [place], Neoma Laming B, MD   7 months ago Migraine with aura and without status migrainosus, not intractable   [place], MD   11 months ago Type 2 diabetes mellitus with morbid obesity [facility])   [place] Ladell Pier, MD   1 year ago Encounter for Medicare annual wellness exam   [place], Stephen L, RPH-CPP   1 year ago Type 2 diabetes, controlled, with peripheral neuropathy [facility])   [place], Deborah B, MD      Future Appointments            In 1 month Wynetta Emery, Dalbert Batman, MD [place]

## 2022-02-24 ENCOUNTER — Other Ambulatory Visit: Payer: Self-pay

## 2022-03-01 ENCOUNTER — Other Ambulatory Visit: Payer: Self-pay

## 2022-03-03 ENCOUNTER — Other Ambulatory Visit: Payer: Self-pay

## 2022-03-08 ENCOUNTER — Inpatient Hospital Stay: Payer: Medicare Other | Attending: Hematology and Oncology

## 2022-03-08 ENCOUNTER — Inpatient Hospital Stay (HOSPITAL_BASED_OUTPATIENT_CLINIC_OR_DEPARTMENT_OTHER): Payer: Medicare Other | Admitting: Hematology and Oncology

## 2022-03-08 ENCOUNTER — Other Ambulatory Visit: Payer: Self-pay

## 2022-03-08 ENCOUNTER — Other Ambulatory Visit: Payer: Self-pay | Admitting: Hematology and Oncology

## 2022-03-08 VITALS — BP 156/72 | HR 86 | Temp 97.9°F | Resp 15 | Wt 198.2 lb

## 2022-03-08 DIAGNOSIS — Z7984 Long term (current) use of oral hypoglycemic drugs: Secondary | ICD-10-CM | POA: Insufficient documentation

## 2022-03-08 DIAGNOSIS — Z7982 Long term (current) use of aspirin: Secondary | ICD-10-CM | POA: Insufficient documentation

## 2022-03-08 DIAGNOSIS — Z794 Long term (current) use of insulin: Secondary | ICD-10-CM | POA: Diagnosis not present

## 2022-03-08 DIAGNOSIS — I129 Hypertensive chronic kidney disease with stage 1 through stage 4 chronic kidney disease, or unspecified chronic kidney disease: Secondary | ICD-10-CM | POA: Diagnosis not present

## 2022-03-08 DIAGNOSIS — D75839 Thrombocytosis, unspecified: Secondary | ICD-10-CM

## 2022-03-08 DIAGNOSIS — N189 Chronic kidney disease, unspecified: Secondary | ICD-10-CM | POA: Diagnosis not present

## 2022-03-08 DIAGNOSIS — E1122 Type 2 diabetes mellitus with diabetic chronic kidney disease: Secondary | ICD-10-CM | POA: Insufficient documentation

## 2022-03-08 DIAGNOSIS — D72829 Elevated white blood cell count, unspecified: Secondary | ICD-10-CM | POA: Diagnosis not present

## 2022-03-08 DIAGNOSIS — Z79899 Other long term (current) drug therapy: Secondary | ICD-10-CM | POA: Diagnosis not present

## 2022-03-08 DIAGNOSIS — M25519 Pain in unspecified shoulder: Secondary | ICD-10-CM | POA: Insufficient documentation

## 2022-03-08 LAB — CMP (CANCER CENTER ONLY)
ALT: 9 U/L (ref 0–44)
AST: 12 U/L — ABNORMAL LOW (ref 15–41)
Albumin: 3.8 g/dL (ref 3.5–5.0)
Alkaline Phosphatase: 90 U/L (ref 38–126)
Anion gap: 5 (ref 5–15)
BUN: 23 mg/dL (ref 8–23)
CO2: 28 mmol/L (ref 22–32)
Calcium: 9.1 mg/dL (ref 8.9–10.3)
Chloride: 101 mmol/L (ref 98–111)
Creatinine: 1.41 mg/dL — ABNORMAL HIGH (ref 0.44–1.00)
GFR, Estimated: 41 mL/min — ABNORMAL LOW (ref >60)
Glucose, Bld: 201 mg/dL — ABNORMAL HIGH (ref 70–99)
Potassium: 4.3 mmol/L (ref 3.5–5.1)
Sodium: 134 mmol/L — ABNORMAL LOW (ref 135–145)
Total Bilirubin: 0.3 mg/dL (ref 0.3–1.2)
Total Protein: 8 g/dL (ref 6.5–8.1)

## 2022-03-08 LAB — CBC WITH DIFFERENTIAL (CANCER CENTER ONLY)
Abs Immature Granulocytes: 0.03 10*3/uL (ref 0.00–0.07)
Basophils Absolute: 0 10*3/uL (ref 0.0–0.1)
Basophils Relative: 0 %
Eosinophils Absolute: 0.4 10*3/uL (ref 0.0–0.5)
Eosinophils Relative: 3 %
HCT: 34.8 % — ABNORMAL LOW (ref 36.0–46.0)
Hemoglobin: 11.4 g/dL — ABNORMAL LOW (ref 12.0–15.0)
Immature Granulocytes: 0 %
Lymphocytes Relative: 19 %
Lymphs Abs: 2 10*3/uL (ref 0.7–4.0)
MCH: 29.9 pg (ref 26.0–34.0)
MCHC: 32.8 g/dL (ref 30.0–36.0)
MCV: 91.3 fL (ref 80.0–100.0)
Monocytes Absolute: 0.5 10*3/uL (ref 0.1–1.0)
Monocytes Relative: 5 %
Neutro Abs: 7.5 10*3/uL (ref 1.7–7.7)
Neutrophils Relative %: 73 %
Platelet Count: 450 10*3/uL — ABNORMAL HIGH (ref 150–400)
RBC: 3.81 MIL/uL — ABNORMAL LOW (ref 3.87–5.11)
RDW: 12.8 % (ref 11.5–15.5)
WBC Count: 10.5 10*3/uL (ref 4.0–10.5)
nRBC: 0 % (ref 0.0–0.2)

## 2022-03-08 NOTE — Progress Notes (Signed)
[place] [phone]   [phone]  PROGRESS NOTE  Patient Care Team: Ladell Pier, MD as PCP - General (Internal Medicine)  Hematological/Oncological History # Thrombocytosis/Leukocytosis 1) Labs from PCP, Dr. Karle Plumber: -01/26/2019: WBC 10.0, Hgb 10.4 (L), MCV 90, Plt 553 (H), Iron 50, TIBC 228 (L), Ferritin 573 (H), Iron Saturation 22, Folate 11 -10/01/2019: WBC 9.6, Hgb 11.1, MCV 92, Plt 487 (H) -11/03/2020: WBC 11.7 (H), Hgb 11.5, MCV 90, Plt 541 (H)   2) 11/18/2020: Establish care with Chelsea Query PA-C  Interval History:  [name] with medical history significant for leukocytosis/thrombocytosis who presents for a follow up visit. The patient's last visit was on 09/08/2021.   At today's visit, Chelsea Coleman is accompanied by a hospital approved interpreter.  She reports her energy levels are good and her appetite is strong.  She does continue to have joint pains predominantly in the neck and shoulders.  She reports that she met with rheumatology and was told that she had osteoporosis.  She notes that she has not been having any issues with bleeding or bruising.  She reports that she has had no other major changes in her health and overall feels quite well.  She denies fevers, chills, night sweats, shortness of breath, chest pain or cough. She has no other complaints. A full 10 point ROS is listed below.  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  She noted that she would want to pursue a bone marrow biopsy to know for certain the cause of her findings.  We will order this bone marrow biopsy today.   MEDICAL HISTORY:  Past Medical History:  Diagnosis Date   Cataract    states removed in 2014   Chronic kidney disease 02/14/2020   Stage 3    Diabetes mellitus without complication (HCC)    GERD (gastroesophageal reflux disease)    Hypertension      SURGICAL HISTORY: Past Surgical History:  Procedure Laterality Date   ABDOMINAL HYSTERECTOMY     fibroid   CHOLECYSTECTOMY     INCONTINENCE SURGERY     REFRACTIVE SURGERY  2017   BL    SOCIAL HISTORY: Social History   Socioeconomic History   Marital status: Married    Spouse name: Not on file   Number of children: 1   Years of education: 12 grade   Highest education level: Not on file  Occupational History   Occupation: unemployed  Tobacco Use   Smoking status: Never   Smokeless tobacco: Never  Vaping Use   Vaping Use: Never used  Substance and Sexual Activity   Alcohol use: Never   Drug use: Never   Sexual activity: Not on file  Other Topics Concern   Not on file  Social History Narrative   Not on file   Social Determinants of Health   Financial Resource Strain: Not on file  Food Insecurity: Not on file  Transportation Needs: Not on file  Physical Activity: Not on file  Stress: Not on file  Social Connections: Not on file  Intimate Partner Violence: Not on file    FAMILY HISTORY: Family History  Problem Relation Age of Onset   Diabetes Mother    Colon cancer Neg Hx    Colon polyps Neg Hx    Esophageal cancer Neg Hx    Rectal cancer Neg Hx    Stomach cancer Neg Hx  ALLERGIES:  has No Known Allergies.  MEDICATIONS:  Current Outpatient Medications  Medication Sig Dispense Refill   Accu-Chek FastClix Lancets MISC Use to check blood sugar 3 TIMES DAILY. (Patient taking differently: Use to check blood sugar 3 TIMES DAILY.) 100 each 2   amLODipine (NORVASC) 5 MG tablet Take 1.5 tablets (7.5 mg total) by mouth daily. 135 tablet 2   aspirin EC 81 MG tablet Take 1 tablet (81 mg total) by mouth daily. Swallow whole. 100 tablet 1   atorvastatin (LIPITOR) 20 MG tablet TAKE 1 TABLET (20 MG TOTAL) BY MOUTH DAILY. 90 tablet 1   Blood Glucose Monitoring Suppl (ACCU-CHEK GUIDE ME) w/Device KIT Use to check blood sugar 3 TIMES DAILY. (Patient taking  differently: Use to check blood sugar 3 TIMES DAILY.) 1 kit 0   clobetasol (TEMOVATE) 0.05 % external solution APPLY SMALL AMOUNT TO AFFECTED AREA 3 TIMES A WEEK AS NEEDED. 50 mL 1   Continuous Blood Gluc Receiver (FREESTYLE LIBRE READER) DEVI use as directed 1 each 0   Continuous Blood Gluc Sensor (FREESTYLE LIBRE SENSOR SYSTEM) MISC Change sensor every 2 weeks (14 days) 2 each 12   dapagliflozin propanediol (FARXIGA) 5 MG TABS tablet Take 1 tablet (5 mg total) by mouth daily before breakfast. 30 tablet 6   gabapentin (NEURONTIN) 400 MG capsule TAKE 1 CAPSULE BY MOUTH TWICE  DAILY 180 capsule 3   glucose blood (ACCU-CHEK GUIDE) test strip Use to check blood sugar 3 TIMES DAILY. (Patient taking differently: Use to check blood sugar 3 TIMES DAILY.) 100 each 2   insulin NPH-regular Human (HUMULIN 70/30) (70-30) 100 UNIT/ML injection Inject 20 units in morning with breakfast and 10 units with dinner 10 mL 5   Insulin Pen Needle (TRUEPLUS PEN NEEDLES) 32G X 4 MM MISC Use to inject insulin twice daily. 100 each 2   Insulin Syringe-Needle U-100 31G X 5/16" 0.3 ML MISC Use to inject insulin twice daily 100 each 2   losartan (COZAAR) 50 MG tablet TAKE 1 TABLET (50 MG TOTAL) BY MOUTH DAILY. 90 tablet 2   omeprazole (PRILOSEC) 20 MG capsule TAKE 1 CAPSULE (20 MG TOTAL) BY MOUTH DAILY. 30 capsule 3   OVER THE COUNTER MEDICATION Take 1 capsule by mouth at bedtime.     oxybutynin (DITROPAN XL) 5 MG 24 hr tablet Take 1 tablet (5 mg total) by mouth at bedtime. 30 tablet 3   topiramate (TOPAMAX) 25 MG tablet Take 1 tablet (25 mg total) by mouth at bedtime as needed. 30 tablet 1   No current facility-administered medications for this visit.    REVIEW OF SYSTEMS:   Constitutional: ( - ) fevers, ( - )  chills , ( - ) night sweats Eyes: ( - ) blurriness of vision, ( - ) double vision, ( - ) watery eyes Ears, nose, mouth, throat, and face: ( - ) mucositis, ( - ) sore throat Respiratory: ( - ) cough, ( - ) dyspnea,  ( - ) wheezes Cardiovascular: ( - ) palpitation, ( - ) chest discomfort, ( - ) lower extremity swelling Gastrointestinal:  ( - ) nausea, ( - ) heartburn, ( - ) change in bowel habits Skin: ( - ) abnormal skin rashes Lymphatics: ( - ) new lymphadenopathy, ( - ) easy bruising Neurological: ( - ) numbness, ( - ) tingling, ( - ) new weaknesses Behavioral/Psych: ( - ) mood change, ( - ) new changes  All other systems were reviewed with the patient and  are negative.  PHYSICAL EXAMINATION:  Vitals:   03/08/22 1408  BP: (!) 156/72  Pulse: 86  Resp: 15  Temp: 97.9 F (36.6 C)  SpO2: 94%   Filed Weights   03/08/22 1408  Weight: 198 lb 3.2 oz (89.9 kg)    GENERAL: well appearing elderly [place] female, alert, no distress and comfortable SKIN: skin color, texture, turgor are normal, no rashes or significant lesions EYES: conjunctiva are pink and non-injected, sclera clear LUNGS: clear to auscultation and percussion with normal breathing effort HEART: regular rate & rhythm and no murmurs and no lower extremity edema Musculoskeletal: no cyanosis of digits and no clubbing  PSYCH: alert & oriented x 3, fluent speech NEURO: no focal motor/sensory deficits  LABORATORY DATA:  I have reviewed the data as listed    Latest Ref Rng & Units 03/08/2022    1:43 PM 09/08/2021   12:24 PM 03/09/2021   10:01 AM  CBC  WBC 4.0 - 10.5 K/uL 10.5  10.7  12.3   Hemoglobin 12.0 - 15.0 g/dL 11.4  11.7  11.4   Hematocrit 36.0 - 46.0 % 34.8  35.5  34.8   Platelets 150 - 400 K/uL 450  461  495        Latest Ref Rng & Units 03/08/2022    1:43 PM 11/30/2021    2:29 PM 09/08/2021   12:24 PM  CMP  Glucose 70 - 99 mg/dL 201  220  125   BUN 8 - 23 mg/dL 23  17  24    Creatinine 0.44 - 1.00 mg/dL 1.41  1.76  1.47   Sodium 135 - 145 mmol/L 134  132  136   Potassium 3.5 - 5.1 mmol/L 4.3  4.2  4.2   Chloride 98 - 111 mmol/L 101  98  104   CO2 22 - 32 mmol/L 28  27  26    Calcium 8.9 - 10.3 mg/dL 9.1  9.2   9.0   Total Protein 6.5 - 8.1 g/dL 8.0  7.9  7.7   Total Bilirubin 0.3 - 1.2 mg/dL 0.3  0.2  0.3   Alkaline Phos 38 - 126 U/L 90  100  99   AST 15 - 41 U/L 12  16  12    ALT 0 - 44 U/L 9  11  9      RADIOGRAPHIC STUDIES: I have personally reviewed the radiological images as listed and agreed with the findings in the report. No results found.  ASSESSMENT & PLAN Chelsea Coleman 66 y.o. female with medical history significant for leukocytosis/thrombocytosis who presents for a follow up visit.   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  This is a mutation of unclear significance and may be the likely driver of her leukocytosis and thrombocytosis.  The patient may still have a myeloproliferative neoplasm, though bone marrow biopsy is not strictly indicated in this situation.    #Leukocytosis/Thrombocytosis #Mutation of Unclear Significance --Labs today show white blood cell 10.5, hemoglobin 0.4, and platelets of 450. No intervention is required at this time. --patient would like to pursue bone marrow biopsy for definitive diagnosis of her hematological abnormalities.  --Return to clinic in 6 months time with repeat labs.  #Diffuse joint pain: --Attempted referral to rheumatology a few months ago but provider was unable to reach patient to make appt so referral was cancelled --Placed a new referral and will coordinate with spanish interpretor to confirm appts with patient.  Orders Placed This Encounter  Procedures   CT BIOPSY    Standing Status:   Future    Standing Expiration Date:   03/09/2023    Order Specific Question:   Lab orders requested (DO NOT place separate lab orders, these will be automatically ordered during procedure specimen collection):    Answer:   Surgical Pathology    Order Specific Question:   Reason for Exam (SYMPTOM  OR DIAGNOSIS REQUIRED)    Answer:   requesting bone marrow biopsy for Leukocytosis/Thrombocytosis with  tp53 mutation    Order Specific Question:   Preferred location?    Answer:   [facility]   CT BONE MARROW BIOPSY & ASPIRATION    Standing Status:   Future    Standing Expiration Date:   03/09/2023    Order Specific Question:   Reason for Exam (SYMPTOM  OR DIAGNOSIS REQUIRED)    Answer:   requesting bone marrow biopsy for Leukocytosis/Thrombocytosis with tp53 mutation    Order Specific Question:   Preferred location?    Answer:   [facility]     All questions were answered. The patient knows to call the clinic with any problems, questions or concerns.  I have spent a total of 30 minutes minutes of face-to-face and non-face-to-face time, preparing to see the patient, performing a medically appropriate examination, counseling and educating the patient, ordering tests, referring and communicating with other health care professionals, documenting clinical information in the electronic health record,  and care coordination.   Ledell Peoples, MD Department of Hematology/Oncology [place] [facility] Phone: [phone] Pager: [phone] Email: [email or web address]    03/08/2022 4:51 PM

## 2022-03-09 ENCOUNTER — Other Ambulatory Visit: Payer: Self-pay

## 2022-03-10 ENCOUNTER — Other Ambulatory Visit: Payer: Self-pay

## 2022-03-16 ENCOUNTER — Other Ambulatory Visit: Payer: Self-pay

## 2022-03-16 MED ORDER — DAPAGLIFLOZIN PROPANEDIOL 5 MG PO TABS
5.0000 mg | ORAL_TABLET | Freq: Every day | ORAL | 11 refills | Status: DC
  Filled 2022-03-16: qty 30, 30d supply, fill #0
  Filled 2022-08-27: qty 360, 360d supply, fill #0

## 2022-03-25 NOTE — Telephone Encounter (Signed)
Patient states that she does not want to do the Evenity injection when she comes in for appointment with Dr. Kelton Pillar.  She will schedule injection when she comes in for doctor's appointment.

## 2022-03-29 ENCOUNTER — Ambulatory Visit: Payer: Medicare Other | Admitting: Internal Medicine

## 2022-03-29 ENCOUNTER — Telehealth: Payer: Self-pay | Admitting: Internal Medicine

## 2022-03-29 ENCOUNTER — Encounter: Payer: Self-pay | Admitting: Internal Medicine

## 2022-03-29 VITALS — BP 140/72 | HR 84 | Ht 63.0 in | Wt 198.0 lb

## 2022-03-29 DIAGNOSIS — M810 Age-related osteoporosis without current pathological fracture: Secondary | ICD-10-CM

## 2022-03-29 DIAGNOSIS — N2581 Secondary hyperparathyroidism of renal origin: Secondary | ICD-10-CM | POA: Diagnosis not present

## 2022-03-29 NOTE — Progress Notes (Signed)
Name: Chelsea Coleman  MRN/ DOB: [id number], 1956/05/21    Age/ Sex: 66 y.o., female    PCP: Ladell Pier, MD   Reason for Endocrinology Evaluation: Osteoporosis     Date of Initial Endocrinology Evaluation: 03/29/2022     HPI: Ms. Chelsea Coleman is a 66 y.o. female with a past medical history of T2DM, HTN, CKD III and dyslipidemia. The patient presented for initial endocrinology clinic visit on 03/29/2022 for consultative assistance with her osteoporosis.    Interpreter line has been used Lowella Dandy)  Pt was diagnosed with osteoporosis: November 2022 with a T score of -5 at the distal radius  Menarche at age : 84 Menopausal at age : She had total hysterectomy at age 76's Fracture Hx: no Hx of HRT: yes , short term  due to hirsutism  FH of osteoporosis or hip fracture: no Prior Hx of anti-estrogenic therapy :no Prior Hx of anti-resorptive therapy : Perfecto Kingdom was recommended in July 2023 but the patient did not want to schedule a separate appointment for her injections and opted to do this during her office visit?  Of note the patient has had CKD III since at least 2020. She follows with nephrology   24-hour urinary calcium was low at 56 mg / 24 hours Patient has been noted with secondary hyperparathyroidism which is multifactorial due to CKD and low calcium intake  SUBJECTIVE:    Today (03/29/22):  Ms. [name] is here for follow-up on osteoporosis. Interpretor line was used.   She is not on calcium tablets, even though she was advised to take calcium on her last visit  She has been consuming milk and chocolates, as she attributes constipation to calcium intake  Evenity has a co- pay   $ 475  which is cost prohibitive   Denies recent falls or bone fractures      HISTORY:  Past Medical History:  Past Medical History:  Diagnosis Date   Cataract    states removed in 2014   Chronic kidney disease 02/14/2020   Stage 3    Diabetes  mellitus without complication (HCC)    GERD (gastroesophageal reflux disease)    Hypertension    Past Surgical History:  Past Surgical History:  Procedure Laterality Date   ABDOMINAL HYSTERECTOMY     fibroid   CHOLECYSTECTOMY     INCONTINENCE SURGERY     REFRACTIVE SURGERY  2017   BL    Social History:  reports that she has never smoked. She has never used smokeless tobacco. She reports that she does not drink alcohol and does not use drugs. Family History: family history includes Diabetes in her mother.   HOME MEDICATIONS: Allergies as of 03/29/2022   No Known Allergies      Medication List        Accurate as of March 29, 2022  8:35 AM. If you have any questions, ask your nurse or doctor.          Accu-Chek FastClix Lancets Misc Use to check blood sugar 3 TIMES DAILY.   Accu-Chek Guide test strip Generic drug: glucose blood Use to check blood sugar 3 TIMES DAILY.   Accu-Chek Guide w/Device Kit Use to check blood sugar 3 TIMES DAILY.   amLODipine 5 MG tablet Commonly known as: NORVASC Take 1.5 tablets (7.5 mg total) by mouth daily.   aspirin EC 81 MG tablet Take 1 tablet (81 mg total) by mouth daily. Swallow whole.  atorvastatin 20 MG tablet Commonly known as: LIPITOR TAKE 1 TABLET (20 MG TOTAL) BY MOUTH DAILY.   clobetasol 0.05 % external solution Commonly known as: TEMOVATE APPLY SMALL AMOUNT TO AFFECTED AREA 3 TIMES A WEEK AS NEEDED.   Farxiga 5 MG Tabs tablet Generic drug: dapagliflozin propanediol Tome 1 tableta (5 mg en total) por va oral diariamente antes de desayunar. (Take 1 tablet (5 mg total) by mouth daily before breakfast.)   dapagliflozin propanediol 5 MG Tabs tablet Commonly known as: FARXIGA Tome 1 tableta (5 mg en total) por va oral diariamente. (Take 1 tablet (5 mg total) by mouth daily.)   FreeStyle Libre 2 Reader [employer] se indica. (use as directed)   FreeStyle Libre 2 Sensor Misc Change sensor every 2 weeks (14  days)   gabapentin 400 MG capsule Commonly known as: NEURONTIN TAKE 1 CAPSULE BY MOUTH TWICE  DAILY   HumuLIN 70/30 (70-30) 100 UNIT/ML injection Generic drug: insulin NPH-regular Human Inject 20 units in morning with breakfast and 10 units with dinner   losartan 50 MG tablet Commonly known as: COZAAR TAKE 1 TABLET (50 MG TOTAL) BY MOUTH DAILY.   omeprazole 20 MG capsule Commonly known as: PRILOSEC TAKE 1 CAPSULE (20 MG TOTAL) BY MOUTH DAILY.   OVER THE COUNTER MEDICATION Take 1 capsule by mouth at bedtime.   oxybutynin 5 MG 24 hr tablet Commonly known as: Ditropan XL Tome 1 tableta (5 mg en total) por va oral antes de acostarse. (Take 1 tablet (5 mg total) by mouth at bedtime.)   topiramate 25 MG tablet Commonly known as: Topamax Take 1 tablet (25 mg total) by mouth at bedtime as needed.   TRUEplus Insulin Syringe 31G X 5/16" 0.3 ML Misc Generic drug: Insulin Syringe-Needle U-100 Use to inject insulin twice daily   TRUEplus Pen Needles 32G X 4 MM Misc Generic drug: Insulin Pen Needle Use to inject insulin twice daily.          REVIEW OF SYSTEMS: A comprehensive ROS was conducted with the patient and is negative except as per HPI    OBJECTIVE:  VS: BP (!) 140/72 (BP Location: Left Arm, Patient Position: Sitting, Cuff Size: Large)   Pulse 84   Ht 5' 3"  (1.6 m)   Wt 198 lb (89.8 kg)   SpO2 96%   BMI 35.07 kg/m    Wt Readings from Last 3 Encounters:  03/29/22 198 lb (89.8 kg)  03/08/22 198 lb 3.2 oz (89.9 kg)  12/28/21 198 lb 9.6 oz (90.1 kg)     EXAM: General: Pt appears well and is in NAD  Lungs: Clear with good BS bilat with no rales, rhonchi, or wheezes  Heart: Auscultation: RRR.  Abdomen: Normoactive bowel sounds, soft, nontender, without masses or organomegaly palpable  Extremities:  BL LE: No pretibial edema normal ROM and strength.  Mental Status: Judgment, insight: Intact Orientation: Oriented to time, place, and person Mood and affect:  No depression, anxiety, or agitation     DATA REVIEWED:    Latest Reference Range & Units 03/08/22 13:43  Sodium 135 - 145 mmol/L 134 (L)  Potassium 3.5 - 5.1 mmol/L 4.3  Chloride 98 - 111 mmol/L 101  CO2 22 - 32 mmol/L 28  Glucose 70 - 99 mg/dL 201 (H)  BUN 8 - 23 mg/dL 23  Creatinine 0.44 - 1.00 mg/dL 1.41 (H)  Calcium 8.9 - 10.3 mg/dL 9.1  Anion gap 5 - 15  5  Alkaline Phosphatase 38 -  126 U/L 90  Albumin 3.5 - 5.0 g/dL 3.8  AST 15 - 41 U/L 12 (L)  ALT 0 - 44 U/L 9  Total Protein 6.5 - 8.1 g/dL 8.0  Total Bilirubin 0.3 - 1.2 mg/dL 0.3  GFR, Est Non African American >60 mL/min 41 (L)      Latest Reference Range & Units Most Recent  Calcium, 24H Urine mg/24 h 56 12/07/21 08:38      Latest Reference Range & Units 11/30/21 14:29  VITD 30.00 - 100.00 ng/mL 30.22    Latest Reference Range & Units 11/30/21 14:29  PTH, Intact 16 - 77 pg/mL 86 (H)  TSH 0.35 - 5.50 uIU/mL 1.57  T4,Free(Direct) 0.60 - 1.60 ng/dL 1.14       DXA 04/20/2021  Site Region Measured Date Measured Age YA BMD Significant CHANGE T-score Left Forearm Radius 33% 04/20/2021 66.5 -5.0 0.444 g/cm2   DualFemur Neck Right 04/20/2021 66.5 -2.7 0.660 g/cm2   DualFemur Total Mean 04/20/2021 66.5 -2.3 0.720 g/cm2    ASSESSMENT/PLAN/RECOMMENDATIONS:   Osteoporosis  - Multifactorial given menopause in her 40's, low calcium intake, chronic PPI use, and CKD III - Emphasized again the importance of optimizing calcium ad vitamin D intake for bone health  - We discussed options of calcium rich diet such as green leafy vegetables and low-fat dairy since she is unable to tolerate calcium tablets due to constipation -Limited antiresorptive agents due to CKD III -I have recommended Evenity but unfortunately it is cost prohibitive with a co-pay of $475 -Will consider Prolia as a second agent -24-hour urinary calcium is low indicating low calcium intake    Medications : Calcium 1200 mg daily ( equivalent 3  servings of calcium rich dairy )   2. Secondary Hyperparathyroidism :  -Elevated PTH is secondary to multiple factors including CKD III and decrease calcium absorption due to the low intake -We will defer to nephrology      F/U in  1 yr  Signed electronically by: Mack Guise, MD  [facility] End[facility]  [address] [place], [place] [address] Phone: [phone] FAX: [phone]   CC: Ladell Pier, MD [place] [address] Phone: [phone] Fax: [phone]   Return to Endocrinology clinic as below: Future Appointments  Date Time Provider [place]  03/30/2022  1:30 PM Ladell Pier, MD [facility] None  10/18/2022 11:00 AM [facility] None  10/18/2022 11:40 AM Orson Slick, MD [facility] None

## 2022-03-29 NOTE — Telephone Encounter (Signed)
Brandy,   Can you please add this pt to the Prolia list?  Evenity was too expensive for her    Thanks

## 2022-03-29 NOTE — Patient Instructions (Signed)
Consume 2-3 servings of low fat dairy such as cheese,  milk yogurt or green leafy vegetables     Consuma 2-3 porciones de lcteos bajos en grasa como queso, yogur de [place] o vegetales de hojas verdes.

## 2022-03-30 ENCOUNTER — Other Ambulatory Visit: Payer: Self-pay

## 2022-03-30 ENCOUNTER — Ambulatory Visit: Payer: Medicare Other | Attending: Internal Medicine | Admitting: Internal Medicine

## 2022-03-30 VITALS — BP 118/71 | HR 83 | Temp 98.3°F | Ht 63.0 in | Wt 200.0 lb

## 2022-03-30 DIAGNOSIS — Z23 Encounter for immunization: Secondary | ICD-10-CM | POA: Diagnosis not present

## 2022-03-30 DIAGNOSIS — E1142 Type 2 diabetes mellitus with diabetic polyneuropathy: Secondary | ICD-10-CM

## 2022-03-30 DIAGNOSIS — E1159 Type 2 diabetes mellitus with other circulatory complications: Secondary | ICD-10-CM | POA: Diagnosis not present

## 2022-03-30 DIAGNOSIS — E1169 Type 2 diabetes mellitus with other specified complication: Secondary | ICD-10-CM

## 2022-03-30 DIAGNOSIS — I152 Hypertension secondary to endocrine disorders: Secondary | ICD-10-CM | POA: Diagnosis not present

## 2022-03-30 DIAGNOSIS — E785 Hyperlipidemia, unspecified: Secondary | ICD-10-CM

## 2022-03-30 DIAGNOSIS — F320 Major depressive disorder, single episode, mild: Secondary | ICD-10-CM

## 2022-03-30 LAB — POCT GLYCOSYLATED HEMOGLOBIN (HGB A1C): HbA1c, POC (controlled diabetic range): 7.4 % — AB (ref 0.0–7.0)

## 2022-03-30 LAB — GLUCOSE, POCT (MANUAL RESULT ENTRY): POC Glucose: 208 mg/dl — AB (ref 70–99)

## 2022-03-30 MED ORDER — HUMULIN 70/30 (70-30) 100 UNIT/ML ~~LOC~~ SUSP
SUBCUTANEOUS | 5 refills | Status: DC
  Filled 2022-03-30: qty 10, 26d supply, fill #0
  Filled 2022-07-06: qty 10, 24d supply, fill #0

## 2022-03-30 MED ORDER — ZOSTER VAC RECOMB ADJUVANTED 50 MCG/0.5ML IM SUSR: 0.5000 mL | Freq: Once | INTRAMUSCULAR | 0 refills | Status: AC

## 2022-03-30 MED ORDER — SERTRALINE HCL 25 MG PO TABS
25.0000 mg | ORAL_TABLET | Freq: Every day | ORAL | 3 refills | Status: DC
  Filled 2022-03-30: qty 30, 30d supply, fill #0
  Filled 2022-07-06: qty 30, 30d supply, fill #1

## 2022-03-30 NOTE — Progress Notes (Signed)
Patient ID: Chelsea Coleman, female    DOB: [birth year]  MRN: [id number]  CC: Diabetes (DM f/u. Congestion x6 mo, unable to breathe. Velta Addison to flu vax.)   Subjective: Chelsea Coleman is a 66 y.o. female who presents for chronic disease management Her concerns today include:  Pt with hx of DM neuropathy and retinopathy, HTN, HL, obesity, anemia of chronic ds, thrombocytosis/leukocytosis (followed by hematology), CKD 3b, ACD, Vit D, osteoporosis, secondary hyperparathyroidism, migraines, chronic BL infarct cerebellar hemisphere, mix urinary incont.   DM: Results for orders placed or performed in visit on 03/30/22  POCT glucose (manual entry)  Result Value Ref Range   POC Glucose 208 (A) 70 - 99 mg/dl  POCT glycosylated hemoglobin (Hb A1C)  Result Value Ref Range   Hemoglobin A1C     HbA1c POC (<> result, manual entry)     HbA1c, POC (prediabetic range)     HbA1c, POC (controlled diabetic range) 7.4 (A) 0.0 - 7.0 %   BS 208/A1C 7.4 (previously 8.4) She reports taking Humulin 70/30 25 units in the morning/10 units in the evening. Out of Farxiga x 1 wk Can not afford co-pay of $49 Got Libre device but only one time.  Reports pharmacy did not refill it and not sure why.  Last had it 3 wks ago.  BS at that time were in the 200s. Checking manually in the a.m since then but not every day; last checked 5 days ago; still in the 200s. -feels she is doing okay with eating habits.  Staying away from sugary drinks.  Depression: feels sometimes like she does not want to live any more due to her chronic medical conditions. Vision is poor and feels she takes so many medications.   Reports she "asks the Lord to take me."  No thoughts of wanting to hurt herself  HL:  Taking and tolerating Lipitor.  request RF on Lipitor  HTN/CKD: Reports compliance with Norvasc 7.5 mg daily and Cozaar 50 mg daily.  She limits salt in the foods.  She is followed by nephrologist Dr. Reeves Dam.  Most recent GFR  from last month was 41.  Range over the past year has been 36-41.  Osteoporosis: Seen by endocrinology Dr. Leonette Monarch again since last visit.  Evenity was cost prohibitive.  Trying to get her approved for Prolia.  Hyperparathyroidism thought to be due to to CKD.  Saw hematology PA last month.  WBC and platelet count continues to improve and normalized.  HM: Flu vaccine and Shingrix.  She would like to get the Shingrix but would like to know whether her insurance covers it or not. 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 12/28/2021   Mixed stress and urge urinary incontinence 12/28/2021   Class 2 severe obesity with serious comorbidity and body mass index (BMI) of 35.0 to 35.9 in adult [facility]) 12/28/2021   Secondary hyperparathyroidism ([place]) 12/02/2021   Age related osteoporosis 11/30/2021   Osteoporosis of multiple sites 04/21/2021   Leukocytosis 11/18/2020   Thrombocytosis 11/18/2020   Hypertension associated with stage 3 chronic kidney disease due to type 2 diabetes mellitus ([place]) 11/03/2020   Obesity (BMI 35.0-39.9 without comorbidity) 11/03/2020   Stage 3b chronic kidney disease ([place]) 04/06/2019   Hyperlipidemia associated with type 2 diabetes mellitus ([place]) 04/06/2019   Gastroesophageal reflux disease without esophagitis 04/06/2019   Type 2 diabetes, controlled, with peripheral neuropathy ([place]) 01/26/2019   Diabetic retinopathy associated  with controlled type 2 diabetes mellitus ([place]) 01/26/2019   Essential hypertension 01/26/2019   Anemia 01/26/2019   Drug-induced constipation 01/26/2019   Memory difficulties 01/26/2019   Positive depression screening 01/26/2019     Current Outpatient Medications on File Prior to Visit  Medication Sig Dispense Refill   Accu-Chek FastClix Lancets MISC Use to check blood sugar 3 TIMES DAILY. (Patient taking differently: Use to check blood sugar 3 TIMES DAILY.) 100 each 2   amLODipine  (NORVASC) 5 MG tablet Take 1.5 tablets (7.5 mg total) by mouth daily. 135 tablet 2   aspirin EC 81 MG tablet Take 1 tablet (81 mg total) by mouth daily. Swallow whole. 100 tablet 1   atorvastatin (LIPITOR) 20 MG tablet TAKE 1 TABLET (20 MG TOTAL) BY MOUTH DAILY. 90 tablet 1   Blood Glucose Monitoring Suppl (ACCU-CHEK GUIDE ME) w/Device KIT Use to check blood sugar 3 TIMES DAILY. (Patient taking differently: Use to check blood sugar 3 TIMES DAILY.) 1 kit 0   clobetasol (TEMOVATE) 0.05 % external solution APPLY SMALL AMOUNT TO AFFECTED AREA 3 TIMES A WEEK AS NEEDED. 50 mL 1   Continuous Blood Gluc Receiver (FREESTYLE LIBRE READER) DEVI use as directed 1 each 0   Continuous Blood Gluc Sensor (FREESTYLE LIBRE SENSOR SYSTEM) MISC Change sensor every 2 weeks (14 days) 2 each 12   dapagliflozin propanediol (FARXIGA) 5 MG TABS tablet Take 1 tablet (5 mg total) by mouth daily before breakfast. 30 tablet 6   dapagliflozin propanediol (FARXIGA) 5 MG TABS tablet Take 1 tablet (5 mg total) by mouth daily. 30 tablet 11   gabapentin (NEURONTIN) 400 MG capsule TAKE 1 CAPSULE BY MOUTH TWICE  DAILY 180 capsule 3   glucose blood (ACCU-CHEK GUIDE) test strip Use to check blood sugar 3 TIMES DAILY. (Patient taking differently: Use to check blood sugar 3 TIMES DAILY.) 100 each 2   Insulin Pen Needle (TRUEPLUS PEN NEEDLES) 32G X 4 MM MISC Use to inject insulin twice daily. 100 each 2   Insulin Syringe-Needle U-100 31G X 5/16" 0.3 ML MISC Use to inject insulin twice daily 100 each 2   losartan (COZAAR) 50 MG tablet TAKE 1 TABLET (50 MG TOTAL) BY MOUTH DAILY. 90 tablet 2   omeprazole (PRILOSEC) 20 MG capsule TAKE 1 CAPSULE (20 MG TOTAL) BY MOUTH DAILY. 30 capsule 3   OVER THE COUNTER MEDICATION Take 1 capsule by mouth at bedtime.     oxybutynin (DITROPAN XL) 5 MG 24 hr tablet Take 1 tablet (5 mg total) by mouth at bedtime. 30 tablet 3   topiramate (TOPAMAX) 25 MG tablet Take 1 tablet (25 mg total) by mouth at bedtime as  needed. 30 tablet 1   No current facility-administered medications on file prior to visit.    No Known Allergies  Social History   Socioeconomic History   Marital status: Married    Spouse name: Not on file   Number of children: 1   Years of education: 12 grade   Highest education level: Not on file  Occupational History   Occupation: unemployed  Tobacco Use   Smoking status: Never   Smokeless tobacco: Never  Vaping Use   Vaping Use: Never used  Substance and Sexual Activity   Alcohol use: Never   Drug use: Never   Sexual activity: Not on file  Other Topics Concern   Not on file  Social History Narrative   Not on file   Social Determinants of Health   Financial  Resource Strain: Not on file  Food Insecurity: Not on file  Transportation Needs: Not on file  Physical Activity: Not on file  Stress: Not on file  Social Connections: Not on file  Intimate Partner Violence: Not on file    Family History  Problem Relation Age of Onset   Diabetes Mother    Colon cancer Neg Hx    Colon polyps Neg Hx    Esophageal cancer Neg Hx    Rectal cancer Neg Hx    Stomach cancer Neg Hx     Past Surgical History:  Procedure Laterality Date   ABDOMINAL HYSTERECTOMY     fibroid   CHOLECYSTECTOMY     INCONTINENCE SURGERY     REFRACTIVE SURGERY  2017   BL    ROS: Review of Systems Negative except as stated above  PHYSICAL EXAM: BP 118/71 (BP Location: Left Arm, Patient Position: Sitting, Cuff Size: Normal)   Pulse 83   Temp 98.3 F (36.8 C) (Oral)   Ht 5' 3"  (1.6 m)   Wt 200 lb (90.7 kg)   SpO2 98%   BMI 35.43 kg/m   Physical Exam  General appearance - alert, well appearing, obese older Hispanic female and in no distress.  She ambulates with a cane. Mental status - normal mood, behavior, speech, dress, motor activity, and thought processes.  Patient very talkative Chest - clear to auscultation, no wheezes, rales or rhonchi, symmetric air entry Heart - normal rate,  regular rhythm, normal S1, S2, no murmurs, rubs, clicks or gallops Extremities - peripheral pulses normal, no pedal edema, no clubbing or cyanosis     03/30/2022    1:41 PM 02/23/2021    3:02 PM 04/28/2020    1:35 PM  Depression screen PHQ 2/9  Decreased Interest 3 0 0  Down, Depressed, Hopeless  0 0  PHQ - 2 Score 3 0 0  Altered sleeping 3    Tired, decreased energy 3    Change in appetite 0    Feeling bad or failure about yourself  0    Trouble concentrating 0    Moving slowly or fidgety/restless 0    Suicidal thoughts 0    PHQ-9 Score 9         Latest Ref Rng & Units 03/08/2022    1:43 PM 11/30/2021    2:29 PM 09/08/2021   12:24 PM  CMP  Glucose 70 - 99 mg/dL 201  220  125   BUN 8 - 23 mg/dL 23  17  24    Creatinine 0.44 - 1.00 mg/dL 1.41  1.76  1.47   Sodium 135 - 145 mmol/L 134  132  136   Potassium 3.5 - 5.1 mmol/L 4.3  4.2  4.2   Chloride 98 - 111 mmol/L 101  98  104   CO2 22 - 32 mmol/L 28  27  26    Calcium 8.9 - 10.3 mg/dL 9.1  9.2  9.0   Total Protein 6.5 - 8.1 g/dL 8.0  7.9  7.7   Total Bilirubin 0.3 - 1.2 mg/dL 0.3  0.2  0.3   Alkaline Phos 38 - 126 U/L 90  100  99   AST 15 - 41 U/L 12  16  12    ALT 0 - 44 U/L 9  11  9     Lipid Panel     Component Value Date/Time   CHOL 166 12/28/2021 1125   TRIG 123 12/28/2021 1125   HDL 58 12/28/2021 1125  CHOLHDL 2.9 12/28/2021 1125   LDLCALC 86 12/28/2021 1125    CBC    Component Value Date/Time   WBC 10.5 03/08/2022 1343   RBC 3.81 (L) 03/08/2022 1343   HGB 11.4 (L) 03/08/2022 1343   HGB 11.5 11/03/2020 1104   HCT 34.8 (L) 03/08/2022 1343   HCT 35.2 11/03/2020 1104   PLT 450 (H) 03/08/2022 1343   PLT 541 (H) 11/03/2020 1104   MCV 91.3 03/08/2022 1343   MCV 90 11/03/2020 1104   MCH 29.9 03/08/2022 1343   MCHC 32.8 03/08/2022 1343   RDW 12.8 03/08/2022 1343   RDW 12.7 11/03/2020 1104   LYMPHSABS 2.0 03/08/2022 1343   MONOABS 0.5 03/08/2022 1343   EOSABS 0.4 03/08/2022 1343   BASOSABS 0.0 03/08/2022  1343    ASSESSMENT AND PLAN:  1. Type 2 diabetes mellitus with peripheral neuropathy (HCC) A1c has improved by one-point since last visit.  However she reports blood sugars have been in the 200s and she has been out of [place] due to cost.  I spoke with our clinical pharmacist today and was informed that she has been approved for the [place] through the patient assistance program.  So she would be able to get it through our pharmacy for additional cost.  We will also try to do a prior approval for her to continue to get advice since she is on insulin. I advised increasing Humulin insulin to 28 units in the mornings and 13 units in the evenings - insulin NPH-regular Humulin (70-30) 100 UNIT/ML injection; Inject 28 units in morning with breakfast and 13 units with dinner  Dispense: 10 mL; Refill: 5 - POCT glucose (manual entry) - POCT glycosylated hemoglobin (Hb A1C)  2. Mild major depression ([place]) Discussed management of depression with counseling plus or minus medication.  Patient states she would prefer to try medication.  Reports transportation issues and may not be able to get to counseling consistently. - sertraline (ZOLOFT) 25 MG tablet; Take 1 tablet (25 mg total) by mouth daily.  Dispense: 30 tablet; Refill: 3  3. Hypertension associated with diabetes ([place]) Controlled.  Continue Norvasc and Cozaar.  4. Hyperlipidemia associated with type 2 diabetes mellitus ([place]) Continue atorvastatin.  Refill sent to her pharmacy.  5. Need for influenza vaccination - Flu Vaccine QUAD High Dose(Fluad)  6. Need for shingles vaccine Advised patient that I am not sure whether her insurance will cover for the Shingrix vaccine or not.  She can either call her insurance and inquire or she can take the prescription to the pharmacy and run it to see whether her insurance would cover it. - Zoster Vaccine Adjuvanted [facility]) injection; Inject 0.5 mLs into the muscle once for 1 dose.  Dispense: 0.5 mL;  Refill: 0   [employer] Language interpreter used during this encounter. #[id number] Merino.  Katharine Look [id number]  Patient was given the opportunity to ask questions.  Patient verbalized understanding of the plan and was able to repeat key elements of the plan.   This documentation was completed using Radio producer.  Any transcriptional errors are unintentional.  Orders Placed This Encounter  Procedures   Flu Vaccine QUAD High Dose(Fluad)   POCT glucose (manual entry)   POCT glycosylated hemoglobin (Hb A1C)     Requested Prescriptions   Signed Prescriptions Disp Refills   sertraline (ZOLOFT) 25 MG tablet 30 tablet 3    Sig: Take 1 tablet (25 mg total) by mouth daily.   Zoster Vaccine Adjuvanted [facility]) injection 0.5  mL 0    Sig: Inject 0.5 mLs into the muscle once for 1 dose.   insulin NPH-regular Human (HUMULIN 70/30) (70-30) 100 UNIT/ML injection 10 mL 5    Sig: Inject 28 units in morning with breakfast and 13 units with dinner    Return in about 4 months (around 07/31/2022) for Give appt with Alycia for Medicare Wellness Visit.Karle Plumber, MD, FACP

## 2022-04-01 ENCOUNTER — Ambulatory Visit: Payer: Medicare Other

## 2022-04-04 NOTE — Telephone Encounter (Signed)
Shamleffer, Melanie Crazier, MD 6 days ago    [place],     Can you please add this pt to the Prolia list?   Evenity was too expensive for her      Thanks

## 2022-04-09 ENCOUNTER — Other Ambulatory Visit: Payer: Self-pay

## 2022-04-09 NOTE — Telephone Encounter (Signed)
Prolia VOB initiated via [email or web address] ? ?New start ? ?

## 2022-04-14 NOTE — Telephone Encounter (Signed)
Prior auth required for [employer]

## 2022-04-19 ENCOUNTER — Ambulatory Visit (HOSPITAL_COMMUNITY)
Admission: RE | Admit: 2022-04-19 | Discharge: 2022-04-19 | Disposition: A | Discharge status: Home / Self Care | Payer: Medicare Other | Source: Ambulatory Visit | Attending: Hematology and Oncology | Admitting: Hematology and Oncology

## 2022-04-19 ENCOUNTER — Other Ambulatory Visit: Payer: Self-pay

## 2022-04-19 ENCOUNTER — Encounter (HOSPITAL_COMMUNITY): Payer: Self-pay

## 2022-04-19 VITALS — BP 128/55 | HR 73 | Temp 97.5°F | Resp 16 | Ht 63.0 in | Wt 198.4 lb

## 2022-04-19 DIAGNOSIS — E1122 Type 2 diabetes mellitus with diabetic chronic kidney disease: Secondary | ICD-10-CM | POA: Insufficient documentation

## 2022-04-19 DIAGNOSIS — D649 Anemia, unspecified: Secondary | ICD-10-CM | POA: Diagnosis not present

## 2022-04-19 DIAGNOSIS — D75839 Thrombocytosis, unspecified: Secondary | ICD-10-CM

## 2022-04-19 DIAGNOSIS — I129 Hypertensive chronic kidney disease with stage 1 through stage 4 chronic kidney disease, or unspecified chronic kidney disease: Secondary | ICD-10-CM | POA: Diagnosis not present

## 2022-04-19 DIAGNOSIS — D696 Thrombocytopenia, unspecified: Secondary | ICD-10-CM | POA: Diagnosis not present

## 2022-04-19 DIAGNOSIS — Z1379 Encounter for other screening for genetic and chromosomal anomalies: Secondary | ICD-10-CM | POA: Insufficient documentation

## 2022-04-19 DIAGNOSIS — K219 Gastro-esophageal reflux disease without esophagitis: Secondary | ICD-10-CM | POA: Diagnosis not present

## 2022-04-19 DIAGNOSIS — D72822 Plasmacytosis: Secondary | ICD-10-CM | POA: Diagnosis not present

## 2022-04-19 DIAGNOSIS — Z1509 Genetic susceptibility to other malignant neoplasm: Secondary | ICD-10-CM | POA: Insufficient documentation

## 2022-04-19 DIAGNOSIS — D72829 Elevated white blood cell count, unspecified: Secondary | ICD-10-CM | POA: Diagnosis not present

## 2022-04-19 DIAGNOSIS — N189 Chronic kidney disease, unspecified: Secondary | ICD-10-CM | POA: Diagnosis not present

## 2022-04-19 LAB — CBC WITH DIFFERENTIAL/PLATELET
Abs Immature Granulocytes: 0.03 10*3/uL (ref 0.00–0.07)
Basophils Absolute: 0.1 10*3/uL (ref 0.0–0.1)
Basophils Relative: 1 %
Eosinophils Absolute: 0.4 10*3/uL (ref 0.0–0.5)
Eosinophils Relative: 3 %
HCT: 34.8 % — ABNORMAL LOW (ref 36.0–46.0)
Hemoglobin: 11 g/dL — ABNORMAL LOW (ref 12.0–15.0)
Immature Granulocytes: 0 %
Lymphocytes Relative: 16 %
Lymphs Abs: 1.7 10*3/uL (ref 0.7–4.0)
MCH: 29.7 pg (ref 26.0–34.0)
MCHC: 31.6 g/dL (ref 30.0–36.0)
MCV: 94.1 fL (ref 80.0–100.0)
Monocytes Absolute: 0.7 10*3/uL (ref 0.1–1.0)
Monocytes Relative: 7 %
Neutro Abs: 7.5 10*3/uL (ref 1.7–7.7)
Neutrophils Relative %: 73 %
Platelets: 430 10*3/uL — ABNORMAL HIGH (ref 150–400)
RBC: 3.7 MIL/uL — ABNORMAL LOW (ref 3.87–5.11)
RDW: 13.4 % (ref 11.5–15.5)
WBC: 10.3 10*3/uL (ref 4.0–10.5)
nRBC: 0 % (ref 0.0–0.2)

## 2022-04-19 LAB — GLUCOSE, CAPILLARY: Glucose-Capillary: 129 mg/dL — ABNORMAL HIGH (ref 70–99)

## 2022-04-19 MED ORDER — FLUMAZENIL 0.5 MG/5ML IV SOLN
INTRAVENOUS | Status: AC
  Filled 2022-04-19: qty 5

## 2022-04-19 MED ORDER — SODIUM CHLORIDE 0.9 % IV SOLN: INTRAVENOUS | Status: DC

## 2022-04-19 MED ORDER — FENTANYL CITRATE (PF) 100 MCG/2ML IJ SOLN
INTRAMUSCULAR | Status: AC
  Filled 2022-04-19: qty 4

## 2022-04-19 MED ORDER — NALOXONE HCL 0.4 MG/ML IJ SOLN
INTRAMUSCULAR | Status: AC
  Filled 2022-04-19: qty 1

## 2022-04-19 MED ORDER — FENTANYL CITRATE (PF) 100 MCG/2ML IJ SOLN
INTRAMUSCULAR | Status: AC | PRN
  Administered 2022-04-19: 50 ug via INTRAVENOUS

## 2022-04-19 MED ORDER — MIDAZOLAM HCL 2 MG/2ML IJ SOLN
INTRAMUSCULAR | Status: AC
  Filled 2022-04-19: qty 4

## 2022-04-19 MED ORDER — MIDAZOLAM HCL 2 MG/2ML IJ SOLN
INTRAMUSCULAR | Status: AC | PRN
  Administered 2022-04-19: 1 mg via INTRAVENOUS

## 2022-04-19 NOTE — Consult Note (Signed)
Chief Complaint: Patient was seen in consultation today for  CT guided bone marrow biopsy  Referring Physician(s): Dorsey,John T IV  Supervising Physician: Mir, Sharen Heck  Patient Status: [facility] - Out-pt  History of Present Illness: Chelsea Coleman is a 66 y.o. female with past medical history significant for chronic kidney disease, diabetes, GERD, and hypertension who presents now with thrombocytosis, leukocytosis and MH96 mutation of uncertain etiology.  She is scheduled today for CT-guided bone marrow biopsy for further evaluation.  Past Medical History:  Diagnosis Date   Cataract    states removed in 2014   Chronic kidney disease 02/14/2020   Stage 3    Diabetes mellitus without complication ([place])    GERD (gastroesophageal reflux disease)    Hypertension     Past Surgical History:  Procedure Laterality Date   ABDOMINAL HYSTERECTOMY     fibroid   CHOLECYSTECTOMY     INCONTINENCE SURGERY     REFRACTIVE SURGERY  2017   BL    Allergies: Patient has no known allergies.  Medications: Prior to Admission medications   Medication Sig Start Date End Date Taking? Authorizing Provider  Accu-Chek FastClix Lancets MISC Use to check blood sugar 3 TIMES DAILY. Patient taking differently: Use to check blood sugar 3 TIMES DAILY. 11/03/20  Yes Ladell Pier, MD  amLODipine (NORVASC) 5 MG tablet Take 1.5 tablets (7.5 mg total) by mouth daily. 12/28/21  Yes Ladell Pier, MD  aspirin EC 81 MG tablet Take 1 tablet (81 mg total) by mouth daily. Swallow whole. 12/28/21  Yes Ladell Pier, MD  atorvastatin (LIPITOR) 20 MG tablet TAKE 1 TABLET (20 MG TOTAL) BY MOUTH DAILY. 12/28/21 12/28/22 Yes Ladell Pier, MD  Blood Glucose Monitoring Suppl (ACCU-CHEK GUIDE ME) w/Device KIT Use to check blood sugar 3 TIMES DAILY. Patient taking differently: Use to check blood sugar 3 TIMES DAILY. 11/03/20  Yes Ladell Pier, MD  clobetasol (TEMOVATE) 0.05 % external  solution APPLY SMALL AMOUNT TO AFFECTED AREA 3 TIMES A WEEK AS NEEDED. 03/30/21  Yes Ladell Pier, MD  Continuous Blood Gluc Receiver (FREESTYLE LIBRE READER) DEVI use as directed 12/28/21  Yes Ladell Pier, MD  dapagliflozin propanediol (FARXIGA) 5 MG TABS tablet Take 1 tablet (5 mg total) by mouth daily before breakfast. 12/28/21  Yes Ladell Pier, MD  dapagliflozin propanediol (FARXIGA) 5 MG TABS tablet Take 1 tablet (5 mg total) by mouth daily. 03/16/22  Yes Claudia Desanctis, MD  gabapentin (NEURONTIN) 400 MG capsule TAKE 1 CAPSULE BY MOUTH TWICE  DAILY 12/30/21  Yes Ladell Pier, MD  glucose blood (ACCU-CHEK GUIDE) test strip Use to check blood sugar 3 TIMES DAILY. Patient taking differently: Use to check blood sugar 3 TIMES DAILY. 11/03/20  Yes Ladell Pier, MD  insulin NPH-regular Human (HUMULIN 70/30) (70-30) 100 UNIT/ML injection Inject 28 units in morning with breakfast and 13 units with dinner 03/30/22  Yes Ladell Pier, MD  Insulin Pen Needle (TRUEPLUS PEN NEEDLES) 32G X 4 MM MISC Use to inject insulin twice daily. 12/01/20  Yes Ladell Pier, MD  losartan (COZAAR) 50 MG tablet TAKE 1 TABLET (50 MG TOTAL) BY MOUTH DAILY. 12/28/21  Yes Ladell Pier, MD  omeprazole (PRILOSEC) 20 MG capsule TAKE 1 CAPSULE (20 MG TOTAL) BY MOUTH DAILY. 08/07/19  Yes Ladell Pier, MD  oxybutynin (DITROPAN XL) 5 MG 24 hr tablet Take 1 tablet (5 mg total) by mouth at bedtime.  01/07/22  Yes Ladell Pier, MD  topiramate (TOPAMAX) 25 MG tablet Take 1 tablet (25 mg total) by mouth at bedtime as needed. 07/27/21  Yes Ladell Pier, MD  Continuous Blood Gluc Sensor (FREESTYLE LIBRE SENSOR SYSTEM) MISC Change sensor every 2 weeks (14 days) 12/28/21   Ladell Pier, MD  Insulin Syringe-Needle U-100 31G X 5/16" 0.3 ML MISC Use to inject insulin twice daily 03/30/21   Ladell Pier, MD  OVER THE COUNTER MEDICATION Take 1 capsule by mouth at bedtime.    [provider]  sertraline (ZOLOFT) 25 MG tablet Take 1 tablet (25 mg total) by mouth daily. 03/30/22   Ladell Pier, MD     Family History  Problem Relation Age of Onset   Diabetes Mother    Colon cancer Neg Hx    Colon polyps Neg Hx    Esophageal cancer Neg Hx    Rectal cancer Neg Hx    Stomach cancer Neg Hx     Social History   Socioeconomic History   Marital status: Married    Spouse name: Not on file   Number of children: 1   Years of education: 12 grade   Highest education level: Not on file  Occupational History   Occupation: unemployed  Tobacco Use   Smoking status: Never   Smokeless tobacco: Never  Vaping Use   Vaping Use: Never used  Substance and Sexual Activity   Alcohol use: Never   Drug use: Never   Sexual activity: Not on file  Other Topics Concern   Not on file  Social History Narrative   Not on file   Social Determinants of Health   Financial Resource Strain: Not on file  Food Insecurity: Not on file  Transportation Needs: Not on file  Physical Activity: Not on file  Stress: Not on file  Social Connections: Not on file      Review of Systems currently denies fever, headache, chest pain, dyspnea, cough, abdominal/back pain, nausea, vomiting or bleeding.  Vital Signs: BP (!) 140/59   Pulse 79   Temp 98.6 F (37 C) (Oral)   Resp 18   Ht _0  (1.6 m)   Wt 198 lb 6.6 oz (90 kg)   SpO2 99%   BMI 35.15 kg/m      Physical Exam awake, alert.  Chest clear to auscultation bilaterally.  Heart with regular rate and rhythm.  Abdomen soft, positive bowel sounds, nontender.  No lower extremity edema.  Imaging: No results found.  Labs:  CBC: Recent Labs    09/08/21 1224 03/08/22 1343  WBC 10.7* 10.5  HGB 11.7* 11.4*  HCT 35.5* 34.8*  PLT 461* 450*    COAGS: No results for input(s): "INR", "APTT" in the last 8760 hours.  BMP: Recent Labs    09/08/21 1224 11/30/21 1429 03/08/22 1343  NA 136 132* 134*  K 4.2 4.2 4.3   CL 104 98 101  CO2 _1 GLUCOSE 125* 220* 201*  BUN 24* 17 23  CALCIUM 9.0 9.2 9.1  CREATININE 1.47* 1.76* 1.41*  GFRNONAA 39*  --  41*    LIVER FUNCTION TESTS: Recent Labs    09/08/21 1224 11/30/21 1429 03/08/22 1343  BILITOT 0.3 0.2 0.3  AST 12* 16 12*  ALT _2 ALKPHOS 99 100 90  PROT 7.7 7.9 8.0  ALBUMIN 3.7 3.7 3.8    TUMOR MARKERS: No results for input(s): "AFPTM", "CEA", "CA199", "CHROMGRNA"  in the last 8760 hours.  Assessment and Plan: 66 y.o. female with past medical history significant for chronic kidney disease, diabetes, GERD, and hypertension who presents now with thrombocytosis, leukocytosis and KL49 mutation of uncertain etiology.  She is scheduled today for CT-guided bone marrow biopsy for further evaluation.Risks and benefits of procedure was discussed with the patient and/or patient's family via interpreter including, but not limited to bleeding, infection, damage to adjacent structures or low yield requiring additional tests.  All of the questions were answered and there is agreement to proceed.  Consent signed and in chart.    Thank you for this interesting consult.  I greatly enjoyed meeting Chelsea Coleman and look forward to participating in their care.  A copy of this report was sent to the requesting provider on this date.  Electronically Signed: D. Rowe Robert, PA-C 04/19/2022, 8:23 AM   I spent a total of  20 minutes   in face to face in clinical consultation, greater than 50% of which was counseling/coordinating care for CT-guided bone marrow biopsy

## 2022-04-19 NOTE — Discharge Instructions (Signed)
Please call Interventional Radiology clinic [phone] with any questions or concerns.  You may remove your dressing and shower tomorrow.   Moderate Conscious Sedation, Adult, Care After This sheet gives you information about how to care for yourself after your procedure. Your health care provider may also give you more specific instructions. If you have problems or questions, contact your health careprovider. What can I expect after the procedure? After the procedure, it is common to have: Sleepiness for several hours. Impaired judgment for several hours. Difficulty with balance. Vomiting if you eat too soon. Follow these instructions at home: For the time period you were told by your health care provider: Rest. Do not participate in activities where you could fall or become injured. Do not drive or use machinery. Do not drink alcohol. Do not take sleeping pills or medicines that cause drowsiness. Do not make important decisions or sign legal documents. Do not take care of children on your own. Eating and drinking  Follow the diet recommended by your health care provider. Drink enough fluid to keep your urine pale yellow. If you vomit: Drink water, juice, or soup when you can drink without vomiting. Make sure you have little or no nausea before eating solid foods.  General instructions Take over-the-counter and prescription medicines only as told by your health care provider. Have a responsible adult stay with you for the time you are told. It is important to have someone help care for you until you are awake and alert. Do not smoke. Keep all follow-up visits as told by your health care provider. This is important. Contact a health care provider if: You are still sleepy or having trouble with balance after 24 hours. You feel light-headed. You keep feeling nauseous or you keep vomiting. You develop a rash. You have a fever. You have redness or swelling around the IV  site. Get help right away if: You have trouble breathing. You have new-onset confusion at home. Summary After the procedure, it is common to feel sleepy, have impaired judgment, or feel nauseous if you eat too soon. Rest after you get home. Know the things you should not do after the procedure. Follow the diet recommended by your health care provider and drink enough fluid to keep your urine pale yellow. Get help right away if you have trouble breathing or new-onset confusion at home. This information is not intended to replace advice given to you by your health care provider. Make sure you discuss any questions you have with your healthcare provider. Document Revised: 09/28/2019 Document Reviewed: 04/26/2019 Elsevier Patient Education  2022 Elsevier Inc.   Bone Marrow Aspiration and Bone Marrow Biopsy, Adult, Care After This sheet gives you information about how to care for yourself after your procedure. Your health care provider may also give you more specific instructions. If you have problems or questions, contact your health careprovider. What can I expect after the procedure? After the procedure, it is common to have: Mild pain and tenderness. Swelling. Bruising. Follow these instructions at home: Puncture site care Follow instructions from your health care provider about how to take care of the puncture site. Make sure you: Wash your hands with soap and water before and after you change your bandage (dressing). If soap and water are not available, use hand sanitizer. Change your dressing as told by your health care provider. Check your puncture site every day for signs of infection. Check for: More redness, swelling, or pain. Fluid or blood. Warmth. Pus or a   bad smell.  Activity Return to your normal activities as told by your health care provider. Ask your health care provider what activities are safe for you. Do not lift anything that is heavier than 10 lb (4.5 kg), or the  limit that you are told, until your health care provider says that it is safe. Do not drive for 24 hours if you were given a sedative during your procedure. General instructions Take over-the-counter and prescription medicines only as told by your health care provider. Do not take baths, swim, or use a hot tub until your health care provider approves. Ask your health care provider if you may take showers. You may only be allowed to take sponge baths. If directed, put ice on the affected area. To do this: Put ice in a plastic bag. Place a towel between your skin and the bag. Leave the ice on for 20 minutes, 2-3 times a day. Keep all follow-up visits as told by your health care provider. This is important.  Contact a health care provider if: Your pain is not controlled with medicine. You have a fever. You have more redness, swelling, or pain around the puncture site. You have fluid or blood coming from the puncture site. Your puncture site feels warm to the touch. You have pus or a bad smell coming from the puncture site. Summary After the procedure, it is common to have mild pain, tenderness, swelling, and bruising. Follow instructions from your health care provider about how to take care of the puncture site and what activities are safe for you. Take over-the-counter and prescription medicines only as told by your health care provider. Contact a health care provider if you have any signs of infection, such as fluid or blood coming from the puncture site. This information is not intended to replace advice given to you by your health care provider. Make sure you discuss any questions you have with your healthcare provider. Document Revised: 10/17/2018 Document Reviewed: 10/17/2018 Elsevier Patient Education  2022 Elsevier Inc.  

## 2022-04-19 NOTE — Procedures (Signed)
Interventional Radiology Procedure Note  Indication: Leukocytosis. Thrombocytopenia.   Procedure: CT guided aspirate and core biopsy of right iliac bone  Complications: None  Bleeding: Minimal  Miachel Roux, MD [phone]

## 2022-04-22 NOTE — Telephone Encounter (Signed)
PA# [id number] Valid:04/22/22-04/23/23

## 2022-04-22 NOTE — Telephone Encounter (Signed)
Pt ready for scheduling on or after 04/22/22  Out-of-pocket cost due at time of visit: $301  Primary: [place] [employer] Adv HMO-POS Prolia co-insurance: 20% (approximately $276) Admin fee co-insurance: 20% (approximately $25)  Secondary: n/a Prolia co-insurance:  Admin fee co-insurance:   Deductible: does not apply  Prior Auth: APPROVED PA# [id number] Valid:04/22/22-04/23/23  ** This summary of benefits is an estimation of the patient's out-of-pocket cost. Exact cost may vary based on individual plan coverage.

## 2022-04-22 NOTE — Telephone Encounter (Signed)
Message left (through interpreter services) to call office and schedule Prolia injection appointment.

## 2022-04-26 LAB — SURGICAL PATHOLOGY

## 2022-04-27 ENCOUNTER — Encounter (HOSPITAL_COMMUNITY): Payer: Self-pay | Admitting: Hematology and Oncology

## 2022-04-30 NOTE — Telephone Encounter (Signed)
Patient scheduled Prolia injection for Tuesday, 05/04/2022 at 1:45 PM.

## 2022-05-04 ENCOUNTER — Ambulatory Visit: Payer: Medicare Other

## 2022-05-04 VITALS — BP 130/74

## 2022-05-04 DIAGNOSIS — M810 Age-related osteoporosis without current pathological fracture: Secondary | ICD-10-CM | POA: Diagnosis not present

## 2022-05-04 MED ORDER — DENOSUMAB 60 MG/ML ~~LOC~~ SOSY
60.0000 mg | PREFILLED_SYRINGE | Freq: Once | SUBCUTANEOUS | Status: AC
  Administered 2022-05-04: 60 mg via SUBCUTANEOUS

## 2022-05-04 NOTE — Progress Notes (Signed)
After obtaining consent, and per orders of Dr. Shamleffer, injection of Prolia 60mg given by Lexton Hidalgo L Gevon Markus in right arm SQ. Patient instructed to remain in clinic for 20 minutes afterwards, and to report any adverse reaction to me immediately.  

## 2022-05-17 DIAGNOSIS — N1832 Chronic kidney disease, stage 3b: Secondary | ICD-10-CM | POA: Diagnosis not present

## 2022-05-24 ENCOUNTER — Other Ambulatory Visit: Payer: Self-pay

## 2022-05-24 DIAGNOSIS — R809 Proteinuria, unspecified: Secondary | ICD-10-CM | POA: Diagnosis not present

## 2022-05-24 DIAGNOSIS — N1831 Chronic kidney disease, stage 3a: Secondary | ICD-10-CM | POA: Diagnosis not present

## 2022-05-24 DIAGNOSIS — E1122 Type 2 diabetes mellitus with diabetic chronic kidney disease: Secondary | ICD-10-CM | POA: Diagnosis not present

## 2022-05-24 DIAGNOSIS — N2581 Secondary hyperparathyroidism of renal origin: Secondary | ICD-10-CM | POA: Diagnosis not present

## 2022-05-24 DIAGNOSIS — E875 Hyperkalemia: Secondary | ICD-10-CM | POA: Diagnosis not present

## 2022-05-24 DIAGNOSIS — I129 Hypertensive chronic kidney disease with stage 1 through stage 4 chronic kidney disease, or unspecified chronic kidney disease: Secondary | ICD-10-CM | POA: Diagnosis not present

## 2022-05-24 DIAGNOSIS — D631 Anemia in chronic kidney disease: Secondary | ICD-10-CM | POA: Diagnosis not present

## 2022-05-24 DIAGNOSIS — E1129 Type 2 diabetes mellitus with other diabetic kidney complication: Secondary | ICD-10-CM | POA: Diagnosis not present

## 2022-05-24 MED ORDER — VITAMIN D (CHOLECALCIFEROL) 25 MCG (1000 UT) PO CAPS: 50.0000 ug | ORAL_CAPSULE | Freq: Every day | ORAL | 3 refills | Status: DC

## 2022-05-29 NOTE — Telephone Encounter (Signed)
Last Prolia inj 05/04/22 Next Prolia inj due 11/03/22

## 2022-06-11 ENCOUNTER — Encounter: Payer: Self-pay | Admitting: Internal Medicine

## 2022-06-11 NOTE — Progress Notes (Signed)
Patient seen by nephrology Dr. Harrie Jeans on 05/24/2022. 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  Hyperkalemia improved most recently with low potassium diet Resume vitamin D 2000 IU daily.

## 2022-07-06 ENCOUNTER — Other Ambulatory Visit: Payer: Self-pay

## 2022-07-06 ENCOUNTER — Other Ambulatory Visit: Payer: Self-pay | Admitting: Internal Medicine

## 2022-07-06 DIAGNOSIS — E1169 Type 2 diabetes mellitus with other specified complication: Secondary | ICD-10-CM

## 2022-07-06 DIAGNOSIS — K219 Gastro-esophageal reflux disease without esophagitis: Secondary | ICD-10-CM

## 2022-07-06 DIAGNOSIS — E1142 Type 2 diabetes mellitus with diabetic polyneuropathy: Secondary | ICD-10-CM

## 2022-07-06 MED ORDER — "INSULIN SYRINGE-NEEDLE U-100 31G X 5/16"" 0.3 ML MISC"
2 refills | Status: DC
  Filled 2022-07-06: qty 100, 50d supply, fill #0
  Filled 2023-04-04: qty 100, 50d supply, fill #1

## 2022-07-06 MED ORDER — ATORVASTATIN CALCIUM 20 MG PO TABS
20.0000 mg | ORAL_TABLET | Freq: Every day | ORAL | 1 refills | Status: DC
  Filled 2022-07-06: qty 90, 90d supply, fill #0
  Filled 2022-10-04: qty 90, 90d supply, fill #1

## 2022-07-06 MED ORDER — OMEPRAZOLE 20 MG PO CPDR
20.0000 mg | DELAYED_RELEASE_CAPSULE | Freq: Every day | ORAL | 3 refills | Status: AC
  Filled 2022-07-06: qty 90, 90d supply, fill #0
  Filled 2023-04-04: qty 30, 30d supply, fill #1

## 2022-07-06 NOTE — Telephone Encounter (Signed)
Requested Prescriptions  Pending Prescriptions Disp Refills   omeprazole (PRILOSEC) 20 MG capsule 30 capsule 3    Sig: TAKE 1 CAPSULE (20 MG TOTAL) BY MOUTH DAILY.     Gastroenterology: Proton Pump Inhibitors Passed - 07/06/2022 10:03 AM      Passed - Valid encounter within last 12 months    Recent Outpatient Visits           3 months ago Type 2 diabetes mellitus with peripheral neuropathy [facility])   [place] Karle Plumber B, MD   6 months ago Type 2 diabetes mellitus with peripheral neuropathy [facility])   [place] Karle Plumber B, MD   11 months ago Migraine with aura and without status migrainosus, not intractable   [place] Karle Plumber B, MD   1 year ago Type 2 diabetes mellitus with morbid obesity [facility])   [place] Karle Plumber B, MD   1 year ago Encounter for Medicare annual wellness exam   [place], RPH-CPP       Future Appointments             In 3 weeks Ladell Pier, MD [place]             Insulin Syringe-Needle U-100 (TRUEPLUS INSULIN SYRINGE) 31G X 5/16" 0.3 ML MISC 100 each 2    Sig: Use to inject insulin twice daily     There is no refill protocol information for this order     atorvastatin (LIPITOR) 20 MG tablet 90 tablet 1    Sig: TAKE 1 TABLET (20 MG TOTAL) BY MOUTH DAILY.     Cardiovascular:  Antilipid - Statins Failed - 07/06/2022 10:03 AM      Failed - Lipid Panel in normal range within the last 12 months    Cholesterol, Total  Date Value Ref Range Status  12/28/2021 166 100 - 199 mg/dL Final   LDL Chol Calc (NIH)  Date Value Ref Range Status  12/28/2021 86 0 - 99 mg/dL Final   HDL  Date Value Ref Range Status  12/28/2021 58 >39 mg/dL Final   Triglycerides  Date Value Ref Range  Status  12/28/2021 123 0 - 149 mg/dL Final         Passed - Patient is not pregnant      Passed - Valid encounter within last 12 months    Recent Outpatient Visits           3 months ago Type 2 diabetes mellitus with peripheral neuropathy ([place])   [place] Karle Plumber B, MD   6 months ago Type 2 diabetes mellitus with peripheral neuropathy [facility])   [place] Karle Plumber B, MD   11 months ago Migraine with aura and without status migrainosus, not intractable   [place] Karle Plumber B, MD   1 year ago Type 2 diabetes mellitus with morbid obesity [facility])   [place] Ladell Pier, MD   1 year ago Encounter for [employer] annual wellness exam   [place], RPH-CPP       Future Appointments  In 3 weeks Ladell Pier, MD [place]            Refused Prescriptions Disp Refills   glucose blood (TRUE METRIX BLOOD GLUCOSE TEST) test strip 100 each     Sig: Use as instructed     Endocrinology: Diabetes - Testing Supplies Passed - 07/06/2022 10:03 AM      Passed - Valid encounter within last 12 months    Recent Outpatient Visits           3 months ago Type 2 diabetes mellitus with peripheral neuropathy [facility])   [place] Karle Plumber B, MD   6 months ago Type 2 diabetes mellitus with peripheral neuropathy [facility])   [place] Karle Plumber B, MD   11 months ago Migraine with aura and without status migrainosus, not intractable   [place] Karle Plumber B, MD   1 year ago Type 2 diabetes mellitus with morbid obesity [facility])   [place] Ladell Pier, MD    1 year ago Encounter for [employer] annual wellness exam   [place], [place], RPH-CPP       Future Appointments             In 3 weeks Ladell Pier, MD [place]

## 2022-07-12 ENCOUNTER — Other Ambulatory Visit: Payer: Self-pay

## 2022-07-28 ENCOUNTER — Telehealth: Payer: Self-pay | Admitting: Hematology and Oncology

## 2022-07-28 NOTE — Telephone Encounter (Signed)
Called patient to reschedule May appointment due to provider PAL. Left voicemail using interpreter about new appointment date/time. Mailing patient calendar.

## 2022-08-02 ENCOUNTER — Ambulatory Visit: Payer: Medicare Other | Attending: Internal Medicine | Admitting: Internal Medicine

## 2022-08-02 ENCOUNTER — Encounter: Payer: Self-pay | Admitting: Internal Medicine

## 2022-08-02 ENCOUNTER — Other Ambulatory Visit: Payer: Self-pay

## 2022-08-02 DIAGNOSIS — R0981 Nasal congestion: Secondary | ICD-10-CM | POA: Diagnosis not present

## 2022-08-02 DIAGNOSIS — E785 Hyperlipidemia, unspecified: Secondary | ICD-10-CM | POA: Diagnosis not present

## 2022-08-02 DIAGNOSIS — E1159 Type 2 diabetes mellitus with other circulatory complications: Secondary | ICD-10-CM

## 2022-08-02 DIAGNOSIS — E1142 Type 2 diabetes mellitus with diabetic polyneuropathy: Secondary | ICD-10-CM | POA: Diagnosis not present

## 2022-08-02 DIAGNOSIS — I152 Hypertension secondary to endocrine disorders: Secondary | ICD-10-CM | POA: Diagnosis not present

## 2022-08-02 DIAGNOSIS — N2581 Secondary hyperparathyroidism of renal origin: Secondary | ICD-10-CM

## 2022-08-02 DIAGNOSIS — D75839 Thrombocytosis, unspecified: Secondary | ICD-10-CM | POA: Diagnosis not present

## 2022-08-02 DIAGNOSIS — E1169 Type 2 diabetes mellitus with other specified complication: Secondary | ICD-10-CM

## 2022-08-02 DIAGNOSIS — F32A Depression, unspecified: Secondary | ICD-10-CM | POA: Diagnosis not present

## 2022-08-02 DIAGNOSIS — N1832 Chronic kidney disease, stage 3b: Secondary | ICD-10-CM

## 2022-08-02 DIAGNOSIS — Z6835 Body mass index (BMI) 35.0-35.9, adult: Secondary | ICD-10-CM

## 2022-08-02 DIAGNOSIS — Z23 Encounter for immunization: Secondary | ICD-10-CM

## 2022-08-02 LAB — POCT GLYCOSYLATED HEMOGLOBIN (HGB A1C): HbA1c, POC (controlled diabetic range): 7.4 % — AB (ref 0.0–7.0)

## 2022-08-02 LAB — GLUCOSE, POCT (MANUAL RESULT ENTRY): POC Glucose: 170 mg/dl — AB (ref 70–99)

## 2022-08-02 MED ORDER — LORATADINE 10 MG PO TABS
10.0000 mg | ORAL_TABLET | Freq: Every day | ORAL | 1 refills | Status: DC
  Filled 2022-08-02: qty 30, 30d supply, fill #0

## 2022-08-02 MED ORDER — OXYBUTYNIN CHLORIDE ER 5 MG PO TB24
5.0000 mg | ORAL_TABLET | Freq: Every day | ORAL | 3 refills | Status: DC
  Filled 2022-08-02: qty 30, 30d supply, fill #0

## 2022-08-02 MED ORDER — HUMULIN 70/30 (70-30) 100 UNIT/ML ~~LOC~~ SUSP
SUBCUTANEOUS | 5 refills | Status: DC
  Filled 2022-08-02: qty 10, 27d supply, fill #0
  Filled 2023-04-04: qty 10, 27d supply, fill #1

## 2022-08-02 MED ORDER — FLUTICASONE PROPIONATE 50 MCG/ACT NA SUSP
1.0000 | Freq: Every day | NASAL | 0 refills | Status: DC
  Filled 2022-08-02: qty 16, 25d supply, fill #0

## 2022-08-02 MED ORDER — ZOSTER VAC RECOMB ADJUVANTED 50 MCG/0.5ML IM SUSR: 0.5000 mL | Freq: Once | INTRAMUSCULAR | 0 refills | Status: AC

## 2022-08-02 NOTE — Progress Notes (Signed)
Patient ID: Chelsea Coleman, female    DOB: Mar 18, 1956  MRN: [id number]  CC: Medication Refill, Diabetes, and Nasal Congestion   Subjective: Chelsea Coleman is a 67 y.o. female who presents for chronic ds management Her concerns today include:  Pt with hx of DM neuropathy and retinopathy, HTN, HL, obesity, anemia of chronic ds, thrombocytosis/leukocytosis (followed by hematology), CKD 3b, ACD, Vit D, osteoporosis (Dr. Leonette Monarch), secondary hyperparathyroidism, migraines, chronic BL infarct cerebellar hemisphere, mix urinary incont.    Pt reports some nasal congestion x 3-4 mths. Clear rhinorrhea.  No postnasal drip.  No cough.  Using OTC Afrin  Leukocytosis/thrombocytosis: Since last visit with me she saw her hematologist Dr. Lorenso Courier.  Bone marrow biopsy done which showed normocytic normochromic anemia and thrombocytosis on peripheral smear, slight plasmacytosis on bone marrow but overall myeloid changes were limited and not considered specific or diagnostic of myeloid neoplasm.  Elev WBC resolved.  Mild thrombocytosis persist but better.  DM: Results for orders placed or performed in visit on 08/02/22  POCT glucose (manual entry)  Result Value Ref Range   POC Glucose 170 (A) 70 - 99 mg/dl  POCT glycosylated hemoglobin (Hb A1C)  Result Value Ref Range   Hemoglobin A1C     HbA1c POC (<> result, manual entry)     HbA1c, POC (prediabetic range)     HbA1c, POC (controlled diabetic range) 7.4 (A) 0.0 - 7.0 %  Checks BS 3x/wk.  Gives range 105-120.   She should be on Farxiga and NPH/regular insulin 28 units in the a.m./13 units in the p.m. Reports actually taking 25 a.m and 10 in p. M. BS this a.m in office is 170.  She has not eaten or drank anything as yet for the a.m  Depression: Started on low-dose Zoloft on last visit. Has taken consistently.  Does not want to become dependent.  Feels she is doing better; feels depression in remission without the med  HL: Reports  compliance with and tolerating Lipitor.  LDL was 86  HTN/CKD: Compliant with Norvasc 7.5 mg and Cozaar 50 mg daily.  Did not take meds as yet for the a.m.  Checks BP occasionally.  Reports readings usually good Saw nephrologist Dr. Reeves Dam in follow-up 05/26/2022.  She has CKD stage III, anemia of chronic renal disease and microalbuminuria due to type 2 diabetes, secondary hyperparathyroidism.  Patient was continued on Cozaar and [place] and resumed vitamin D 2000 IU daily.  Confused and purchased vit B12 instead of Vit D H&H 11/34.8 which is stable. Last GFR was 41.  HM:  rxn for shingles vaccine given on last visit to take downstairs to our pharmacy.  Reports she did take it to pharmacy but no injection given.   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 12/28/2021   Mixed stress and urge urinary incontinence 12/28/2021   Class 2 severe obesity with serious comorbidity and body mass index (BMI) of 35.0 to 35.9 in adult [facility]) 12/28/2021   Secondary hyperparathyroidism ([place]) 12/02/2021   Age related osteoporosis 11/30/2021   Osteoporosis of multiple sites 04/21/2021   Leukocytosis 11/18/2020   Thrombocytosis 11/18/2020   Hypertension associated with stage 3 chronic kidney disease due to type 2 diabetes mellitus ([place]) 11/03/2020   Obesity (BMI 35.0-39.9 without comorbidity) 11/03/2020   Stage 3b chronic kidney disease ([place]) 04/06/2019   Hyperlipidemia associated with type 2 diabetes mellitus ([place]) 04/06/2019   Gastroesophageal reflux disease without  esophagitis 04/06/2019   Type 2 diabetes, controlled, with peripheral neuropathy ([place]) 01/26/2019   Diabetic retinopathy associated with controlled type 2 diabetes mellitus ([place]) 01/26/2019   Essential hypertension 01/26/2019   Anemia 01/26/2019   Drug-induced constipation 01/26/2019   Memory difficulties 01/26/2019   Positive depression screening 01/26/2019     Current Outpatient  Medications on File Prior to Visit  Medication Sig Dispense Refill   amLODipine (NORVASC) 5 MG tablet Take 1.5 tablets (7.5 mg total) by mouth daily. 135 tablet 2   atorvastatin (LIPITOR) 20 MG tablet Take 1 tablet (20 mg total) by mouth daily. 90 tablet 1   dapagliflozin propanediol (FARXIGA) 5 MG TABS tablet Take 1 tablet (5 mg total) by mouth daily before breakfast. 30 tablet 6   gabapentin (NEURONTIN) 400 MG capsule TAKE 1 CAPSULE BY MOUTH TWICE  DAILY 180 capsule 3   losartan (COZAAR) 50 MG tablet TAKE 1 TABLET (50 MG TOTAL) BY MOUTH DAILY. 90 tablet 2   Accu-Chek FastClix Lancets MISC Use to check blood sugar 3 TIMES DAILY. (Patient taking differently: Use to check blood sugar 3 TIMES DAILY.) 100 each 2   aspirin EC 81 MG tablet Take 1 tablet (81 mg total) by mouth daily. Swallow whole. 100 tablet 1   Blood Glucose Monitoring Suppl (ACCU-CHEK GUIDE ME) w/Device KIT Use to check blood sugar 3 TIMES DAILY. (Patient taking differently: Use to check blood sugar 3 TIMES DAILY.) 1 kit 0   clobetasol (TEMOVATE) 0.05 % external solution APPLY SMALL AMOUNT TO AFFECTED AREA 3 TIMES A WEEK AS NEEDED. 50 mL 1   Continuous Blood Gluc Receiver (FREESTYLE LIBRE READER) DEVI use as directed 1 each 0   Continuous Blood Gluc Sensor (FREESTYLE LIBRE SENSOR SYSTEM) MISC Change sensor every 2 weeks (14 days) 2 each 12   dapagliflozin propanediol (FARXIGA) 5 MG TABS tablet Take 1 tablet (5 mg total) by mouth daily. 30 tablet 11   glucose blood (ACCU-CHEK GUIDE) test strip Use to check blood sugar 3 TIMES DAILY. (Patient taking differently: Use to check blood sugar 3 TIMES DAILY.) 100 each 2   Insulin Pen Needle (TRUEPLUS PEN NEEDLES) 32G X 4 MM MISC Use to inject insulin twice daily. 100 each 2   Insulin Syringe-Needle U-100 (TRUEPLUS INSULIN SYRINGE) 31G X 5/16" 0.3 ML MISC Use to inject insulin twice daily 100 each 2   omeprazole (PRILOSEC) 20 MG capsule TAKE 1 CAPSULE (20 MG TOTAL) BY MOUTH DAILY. 30 capsule 3    OVER THE COUNTER MEDICATION Take 1 capsule by mouth at bedtime.     topiramate (TOPAMAX) 25 MG tablet Take 1 tablet (25 mg total) by mouth at bedtime as needed. 30 tablet 1   Vitamin D, Cholecalciferol, 25 MCG (1000 UT) CAPS Take 50 mcg by mouth daily. 180 capsule 3   No current facility-administered medications on file prior to visit.    No Known Allergies  Social History   Socioeconomic History   Marital status: Married    Spouse name: Not on file   Number of children: 1   Years of education: 12 grade   Highest education level: Not on file  Occupational History   Occupation: unemployed  Tobacco Use   Smoking status: Never   Smokeless tobacco: Never  Vaping Use   Vaping Use: Never used  Substance and Sexual Activity   Alcohol use: Never   Drug use: Never   Sexual activity: Not on file  Other Topics Concern   Not on file  Social History Narrative   Not on file   Social Determinants of Health   Financial Resource Strain: Not on file  Food Insecurity: Not on file  Transportation Needs: Not on file  Physical Activity: Not on file  Stress: Not on file  Social Connections: Not on file  Intimate Partner Violence: Not on file    Family History  Problem Relation Age of Onset   Diabetes Mother    Colon cancer Neg Hx    Colon polyps Neg Hx    Esophageal cancer Neg Hx    Rectal cancer Neg Hx    Stomach cancer Neg Hx     Past Surgical History:  Procedure Laterality Date   ABDOMINAL HYSTERECTOMY     fibroid   CHOLECYSTECTOMY     INCONTINENCE SURGERY     REFRACTIVE SURGERY  2017   BL    ROS: Review of Systems Negative except as stated above  PHYSICAL EXAM: BP 129/78 (BP Location: Right Arm, Cuff Size: Large)   Pulse 77   Wt 201 lb 12.8 oz (91.5 kg)   SpO2 99%   BMI 35.75 kg/m   Wt Readings from Last 3 Encounters:  08/02/22 201 lb 12.8 oz (91.5 kg)  04/19/22 198 lb 6.6 oz (90 kg)  03/30/22 200 lb (90.7 kg)    Physical Exam  General appearance -  alert, well appearing, and in no distress Mental status - normal mood, behavior, speech, dress, motor activity, and thought processes Neck - supple, no significant adenopathy Nose: Slight redness and crusting at the opening of the nares.  Mild to moderate enlargement of nasal turbinates. Chest - clear to auscultation, no wheezes, rales or rhonchi, symmetric air entry Heart - normal rate, regular rhythm, normal S1, S2, no murmurs, rubs, clicks or gallops Extremities - peripheral pulses normal, no pedal edema, no clubbing or cyanosis Diabetic Foot Exam - Simple   Simple Foot Form Diabetic Foot exam was performed with the following findings: Yes 08/02/2022  9:26 AM  Visual Inspection No deformities, no ulcerations, no other skin breakdown bilaterally: Yes Sensation Testing Intact to touch and monofilament testing bilaterally: Yes Pulse Check Posterior Tibialis and Dorsalis pulse intact bilaterally: Yes Comments        03/30/2022    1:41 PM 02/23/2021    3:02 PM 04/28/2020    1:35 PM  Depression screen PHQ 2/9  Decreased Interest 3 0 0  Down, Depressed, Hopeless  0 0  PHQ - 2 Score 3 0 0  Altered sleeping 3    Tired, decreased energy 3    Change in appetite 0    Feeling bad or failure about yourself  0    Trouble concentrating 0    Moving slowly or fidgety/restless 0    Suicidal thoughts 0    PHQ-9 Score 9         Latest Ref Rng & Units 03/08/2022    1:43 PM 11/30/2021    2:29 PM 09/08/2021   12:24 PM  CMP  Glucose 70 - 99 mg/dL 201  220  125   BUN 8 - 23 mg/dL 23  17  24   $ Creatinine 0.44 - 1.00 mg/dL 1.41  1.76  1.47   Sodium 135 - 145 mmol/L 134  132  136   Potassium 3.5 - 5.1 mmol/L 4.3  4.2  4.2   Chloride 98 - 111 mmol/L 101  98  104   CO2 22 - 32 mmol/L 28  27  26  Calcium 8.9 - 10.3 mg/dL 9.1  9.2  9.0   Total Protein 6.5 - 8.1 g/dL 8.0  7.9  7.7   Total Bilirubin 0.3 - 1.2 mg/dL 0.3  0.2  0.3   Alkaline Phos 38 - 126 U/L 90  100  99   AST 15 - 41 U/L 12  16   12   $ ALT 0 - 44 U/L 9  11  9    $ Lipid Panel     Component Value Date/Time   CHOL 166 12/28/2021 1125   TRIG 123 12/28/2021 1125   HDL 58 12/28/2021 1125   CHOLHDL 2.9 12/28/2021 1125   LDLCALC 86 12/28/2021 1125    CBC    Component Value Date/Time   WBC 10.3 04/19/2022 0824   RBC 3.70 (L) 04/19/2022 0824   HGB 11.0 (L) 04/19/2022 0824   HGB 11.4 (L) 03/08/2022 1343   HGB 11.5 11/03/2020 1104   HCT 34.8 (L) 04/19/2022 0824   HCT 35.2 11/03/2020 1104   PLT 430 (H) 04/19/2022 0824   PLT 450 (H) 03/08/2022 1343   PLT 541 (H) 11/03/2020 1104   MCV 94.1 04/19/2022 0824   MCV 90 11/03/2020 1104   MCH 29.7 04/19/2022 0824   MCHC 31.6 04/19/2022 0824   RDW 13.4 04/19/2022 0824   RDW 12.7 11/03/2020 1104   LYMPHSABS 1.7 04/19/2022 0824   MONOABS 0.7 04/19/2022 0824   EOSABS 0.4 04/19/2022 0824   BASOSABS 0.1 04/19/2022 0824    ASSESSMENT AND PLAN:  1. Type 2 diabetes mellitus with morbid obesity ([place]) Assoc with HTN and CKD Close to goal. Recommend change Humulin 70/30 insulin to 25 units in the morning and 12 units in the evening. Continue to encourage healthy eating habits. - POCT glucose (manual entry) - POCT glycosylated hemoglobin (Hb A1C)  2. Nasal congestion Stop Afrin nasal spray. Changed to Flonase.  Advised to use it only several times a week to prevent nosebleeds. - loratadine (CLARITIN) 10 MG tablet; Take 1 tablet (10 mg total) by mouth daily.  Dispense: 30 tablet; Refill: 1 - fluticasone (FLONASE) 50 MCG/ACT nasal spray; Place 1 spray into both nostrils daily.  Dispense: 16 g; Refill: 0  3.  Depressive disorder in remission D/C Zoloft as patient does not feel she needs it anymore.  4. Thrombocytosis Improving. Bone marrow biopsy negative for malignancy  5. Stage 3b chronic kidney disease (CKD) ([facility]) Stable.  Avoid NSAIDs.  6. Secondary hyperparathyroidism ([place]) Secondary to CKD  7. Hypertension associated with diabetes ([place]) At goal.  Continue  Norvasc 7.5 mg daily and Cozaar 50 mg  9. Hyperlipidemia associated with type 2 diabetes mellitus ([facility]) Continue atorvastatin  10. Need for shingles vaccine Printed prescription given for the Shingrix vaccine again for her to take to our pharmacy.  They can run it and see whether it is covered by her insurance or not.   [employer] Language interpreter used during this encounter. [id number], Mauricio  Patient was given the opportunity to ask questions.  Patient verbalized understanding of the plan and was able to repeat key elements of the plan.   This documentation was completed using Radio producer.  Any transcriptional errors are unintentional.  Orders Placed This Encounter  Procedures   POCT glucose (manual entry)   POCT glycosylated hemoglobin (Hb A1C)     Requested Prescriptions   Signed Prescriptions Disp Refills   loratadine (CLARITIN) 10 MG tablet 30 tablet 1    Sig: Take 1 tablet (10 mg total)  by mouth daily.   fluticasone (FLONASE) 50 MCG/ACT nasal spray 16 g 0    Sig: Place 1 spray into both nostrils daily.   Zoster Vaccine Adjuvanted [facility]) injection 0.5 mL 0    Sig: Inject 0.5 mLs into the muscle once for 1 dose.   oxybutynin (DITROPAN XL) 5 MG 24 hr tablet 30 tablet 3    Sig: Take 1 tablet (5 mg total) by mouth at bedtime.   insulin NPH-regular Human (HUMULIN 70/30) (70-30) 100 UNIT/ML injection 10 mL 5    Sig: Inject 25 units in morning with breakfast and 12 units with dinner    Return in about 4 months (around 12/01/2022) for Give appt with CMA for Medicare Wellness Visit.  Karle Plumber, MD, FACP

## 2022-08-04 ENCOUNTER — Telehealth: Payer: Self-pay | Admitting: Hematology and Oncology

## 2022-08-04 NOTE — Telephone Encounter (Signed)
Patient called to reschedule appointments. Patient rescheduled and notified.

## 2022-08-06 ENCOUNTER — Other Ambulatory Visit: Payer: Self-pay

## 2022-08-06 ENCOUNTER — Ambulatory Visit: Payer: Medicare Other | Attending: Internal Medicine

## 2022-08-06 DIAGNOSIS — Z0000 Encounter for general adult medical examination without abnormal findings: Secondary | ICD-10-CM | POA: Diagnosis not present

## 2022-08-06 MED ORDER — ZOSTER VAC RECOMB ADJUVANTED 50 MCG/0.5ML IM SUSR: 0.5000 mL | Freq: Once | INTRAMUSCULAR | 0 refills | Status: AC

## 2022-08-06 MED ORDER — ZOSTER VAC RECOMB ADJUVANTED 50 MCG/0.5ML IM SUSR: 0.5000 mL | Freq: Once | INTRAMUSCULAR | 0 refills | Status: DC

## 2022-08-06 NOTE — Progress Notes (Signed)
Subjective:   Chelsea Coleman is a 67 y.o. female who presents for Medicare Annual (Subsequent) preventive examination.  Review of Systems    connected with  Chelsea Coleman on  08/06/22 at  838 am by telephone and verified that I am speaking with the correct person using two identifiers. I discussed the limitations, risks, security and privacy concerns of performing an evaluation and management service by telephone and the availability of in person appointments. I also discussed with the patient that there may be a patient responsible charge related to this service. The patient expressed understanding and agreed to proceed.  Patient location:  Home  My Location: [facility]  Persons on the telephone call:   Myself ( Shareena Nusz Bassett), Interpreter Lowella Dandy [phone]), and Chelsea Coleman interpreter was used [employer] [phone]    Objective:    There were no vitals filed for this visit. There is no height or weight on file to calculate BMI.     08/06/2022    8:52 AM 04/19/2022    8:01 AM 02/23/2021    3:00 PM 11/18/2020    1:32 PM 02/04/2020    9:17 AM  Advanced Directives  Does Patient Have a Medical Advance Directive? No No No No No  Would patient like information on creating a medical advance directive?  No - Patient declined No - Patient declined No - Patient declined Yes (Inpatient - patient defers creating a medical advance directive at this time - Information given)    Current Medications (verified) Outpatient Encounter Medications as of 08/06/2022  Medication Sig   Accu-Chek FastClix Lancets MISC Use to check blood sugar 3 TIMES DAILY. (Patient taking differently: Use to check blood sugar 3 TIMES DAILY.)   amLODipine (NORVASC) 5 MG tablet Take 1.5 tablets (7.5 mg total) by mouth daily.   atorvastatin (LIPITOR) 20 MG tablet Take 1 tablet (20 mg total) by mouth daily.   Blood Glucose Monitoring Suppl (ACCU-CHEK GUIDE ME) w/Device KIT Use to check blood  sugar 3 TIMES DAILY. (Patient taking differently: Use to check blood sugar 3 TIMES DAILY.)   Continuous Blood Gluc Receiver (FREESTYLE LIBRE READER) DEVI use as directed   Continuous Blood Gluc Sensor (FREESTYLE LIBRE SENSOR SYSTEM) MISC Change sensor every 2 weeks (14 days)   dapagliflozin propanediol (FARXIGA) 5 MG TABS tablet Take 1 tablet (5 mg total) by mouth daily before breakfast.   dapagliflozin propanediol (FARXIGA) 5 MG TABS tablet Take 1 tablet (5 mg total) by mouth daily.   fluticasone (FLONASE) 50 MCG/ACT nasal spray Place 1 spray into both nostrils daily.   gabapentin (NEURONTIN) 400 MG capsule TAKE 1 CAPSULE BY MOUTH TWICE  DAILY   glucose blood (ACCU-CHEK GUIDE) test strip Use to check blood sugar 3 TIMES DAILY. (Patient taking differently: Use to check blood sugar 3 TIMES DAILY.)   insulin NPH-regular Human (HUMULIN 70/30) (70-30) 100 UNIT/ML injection Inject 25 units in morning with breakfast and 12 units with dinner   Insulin Pen Needle (TRUEPLUS PEN NEEDLES) 32G X 4 MM MISC Use to inject insulin twice daily.   Insulin Syringe-Needle U-100 (TRUEPLUS INSULIN SYRINGE) 31G X 5/16" 0.3 ML MISC Use to inject insulin twice daily   loratadine (CLARITIN) 10 MG tablet Take 1 tablet (10 mg total) by mouth daily.   losartan (COZAAR) 50 MG tablet TAKE 1 TABLET (50 MG TOTAL) BY MOUTH DAILY.   omeprazole (PRILOSEC) 20 MG capsule TAKE 1 CAPSULE (20 MG TOTAL) BY MOUTH DAILY.  Vitamin D, Cholecalciferol, 25 MCG (1000 UT) CAPS Take 50 mcg by mouth daily.   aspirin EC 81 MG tablet Take 1 tablet (81 mg total) by mouth daily. Swallow whole. (Patient not taking: Reported on 08/06/2022)   clobetasol (TEMOVATE) 0.05 % external solution APPLY SMALL AMOUNT TO AFFECTED AREA 3 TIMES A WEEK AS NEEDED.   OVER THE COUNTER MEDICATION Take 1 capsule by mouth at bedtime.   oxybutynin (DITROPAN XL) 5 MG 24 hr tablet Take 1 tablet (5 mg total) by mouth at bedtime.   topiramate (TOPAMAX) 25 MG tablet Take 1 tablet  (25 mg total) by mouth at bedtime as needed. (Patient not taking: Reported on 08/06/2022)   No facility-administered encounter medications on file as of 08/06/2022.    Allergies (verified) Patient has no known allergies.   History: Past Medical History:  Diagnosis Date   Cataract    states removed in 2014   Chronic kidney disease 02/14/2020   Stage 3    Diabetes mellitus without complication (HCC)    GERD (gastroesophageal reflux disease)    Hypertension    Past Surgical History:  Procedure Laterality Date   ABDOMINAL HYSTERECTOMY     fibroid   CHOLECYSTECTOMY     INCONTINENCE SURGERY     REFRACTIVE SURGERY  2017   BL   Family History  Problem Relation Age of Onset   Diabetes Mother    Colon cancer Neg Hx    Colon polyps Neg Hx    Esophageal cancer Neg Hx    Rectal cancer Neg Hx    Stomach cancer Neg Hx    Social History   Socioeconomic History   Marital status: Married    Spouse name: Not on file   Number of children: 1   Years of education: 12 grade   Highest education level: Not on file  Occupational History   Occupation: unemployed  Tobacco Use   Smoking status: Never   Smokeless tobacco: Never  Vaping Use   Vaping Use: Never used  Substance and Sexual Activity   Alcohol use: Never   Drug use: Never   Sexual activity: Not on file  Other Topics Concern   Not on file  Social History Narrative   Not on file   Social Determinants of Health   Financial Resource Strain: Medium Risk (08/06/2022)   Overall Financial Resource Strain (CARDIA)    Difficulty of Paying Living Expenses: Somewhat hard  Food Insecurity: Food Insecurity Present (08/06/2022)   Hunger Vital Sign    Worried About Running Out of Food in the Last Year: Sometimes true    Ran Out of Food in the Last Year: Sometimes true  Transportation Needs: Unmet Transportation Needs (08/06/2022)   PRAPARE - Transportation    Lack of Transportation (Medical): Yes    Lack of Transportation  (Non-Medical): Yes  Physical Activity: Inactive (08/06/2022)   Exercise Vital Sign    Days of Exercise per Week: 0 days    Minutes of Exercise per Session: 0 min  Stress: No Stress Concern Present (08/06/2022)   [employer] [place]    Feeling of Stress : Not at all  Social Connections: Socially Isolated (08/06/2022)   Social Connection and Isolation Panel [NHANES]    Frequency of Communication with Friends and Family: Once a week    Frequency of Social Gatherings with Friends and Family: Never    Attends Religious Services: Never    Marine scientist or Organizations: No  Attends Archivist Meetings: Never    Marital Status: Married    Tobacco Counseling Counseling given: Not Answered   Clinical Intake:     Pain : No/denies pain     Diabetes: Yes     Diabetic?yes          Activities of Daily Living    08/06/2022    9:03 AM  In your present state of health, do you have any difficulty performing the following activities:  Hearing? 0  Vision? 1  Difficulty concentrating or making decisions? 0  Walking or climbing stairs? 1  Dressing or bathing? 1  Doing errands, shopping? 0  Preparing Food and eating ? N  Using the Toilet? N  In the past six months, have you accidently leaked urine? N  Do you have problems with loss of bowel control? N  Managing your Medications? N  Managing your Finances? N  Housekeeping or managing your Housekeeping? N    Patient Care Team: Ladell Pier, MD as PCP - General (Internal Medicine)  Indicate any recent Medical Services you may have received from other than [facility] providers in the past year (date may be approximate).     Assessment:   This is a routine wellness examination for Jamar.  Hearing/Vision screen No results found.  Dietary issues and exercise activities discussed:     Goals Addressed   None   Depression Screen    03/30/2022     1:41 PM 02/23/2021    3:02 PM 04/28/2020    1:35 PM 02/04/2020    9:21 AM 12/24/2019    4:05 PM 09/27/2019    9:56 AM 01/26/2019    9:32 AM  PHQ 2/9 Scores  PHQ - 2 Score 3 0 0 0 0 0 6  PHQ- 9 Score 9      27    Fall Risk    08/06/2022    8:54 AM 08/02/2022    8:54 AM 03/30/2022    1:41 PM 12/28/2021    9:48 AM 03/30/2021   10:16 AM  Fall Risk   Falls in the past year? 0 0 0 0 0  Number falls in past yr: 0 0 0 0 0  Injury with Fall? 0 0 0 0 0  Risk for fall due to : No Fall Risks No Fall Risks No Fall Risks No Fall Risks No Fall Risks    FALL RISK PREVENTION PERTAINING TO THE HOME:  Any stairs in or around the home? No  If so, are there any without handrails? No  Home free of loose throw rugs in walkways, pet beds, electrical cords, etc? Yes  Adequate lighting in your home to reduce risk of falls? Yes   ASSISTIVE DEVICES UTILIZED TO PREVENT FALLS:  Life alert? No  Use of a cane, walker or w/c? Yes  Grab bars in the bathroom? No  Shower chair or bench in shower? No  Elevated toilet seat or a handicapped toilet? No   TIMED UP AND GO:  Was the test performed? No .  Length of time to ambulate 10 feet:  sec.   Gait slow and steady with assistive device  Cognitive Function:    08/06/2022    9:08 AM 02/23/2021    3:05 PM 02/04/2020    9:11 AM  MMSE - Mini Mental State Exam  Orientation to time '5 5 5  '$ Orientation to Place '1 5 5  '$ Registration '3 3 3  '$ Attention/ Calculation '5 5 5  '$ Recall  $'3 3 3  'f$ Language- name 2 objects '2 2 2  '$ Language- repeat '1 1 1  '$ Language- follow 3 step command '3 3 3  '$ Language- read & follow direction '1 1 1  '$ Write a sentence '1 1 1  '$ Copy design 1 1 0  Total score '26 30 29        '$ 08/06/2022    9:13 AM  6CIT Screen  What Year? 4 points  What month? 0 points  What time? 0 points  Count back from 20 0 points  Repeat phrase 0 points    Immunizations Immunization History  Administered Date(s) Administered   Fluad Quad(high Dose 65+)  03/30/2022   Influenza,inj,Quad PF,6+ Mos 04/06/2019, 02/04/2020, 03/30/2021   PFIZER(Purple Top)SARS-COV-2 Vaccination 09/02/2019, 09/23/2019   PNEUMOCOCCAL CONJUGATE-20 12/28/2021   Pneumococcal Conjugate-13 12/24/2019   Tdap 04/06/2019    TDAP status: Up to date  Flu Vaccine status: Up to date  Pneumococcal vaccine status: Up to date  Covid-19 vaccine status: Information provided on how to obtain vaccines.   Qualifies for Shingles Vaccine? Yes   Zostavax completed No   Shingrix Completed?: No.    Education has been provided regarding the importance of this vaccine. Patient has been advised to call insurance company to determine out of pocket expense if they have not yet received this vaccine. Advised may also receive vaccine at local pharmacy or Health Dept. Verbalized acceptance and understanding.  Screening Tests Health Maintenance  Topic Date Due   Zoster Vaccines- Shingrix (1 of 2) Never done   COVID-19 Vaccine (3 - 2023-24 season) 02/12/2022   OPHTHALMOLOGY EXAM  12/08/2022   Diabetic kidney evaluation - Urine ACR  12/29/2022   HEMOGLOBIN A1C  01/31/2023   Diabetic kidney evaluation - eGFR measurement  03/09/2023   FOOT EXAM  08/03/2023   Medicare Annual Wellness (AWV)  08/07/2023   MAMMOGRAM  01/19/2024   COLONOSCOPY (Pts 45-83yr Insurance coverage will need to be confirmed)  05/26/2025   DTaP/Tdap/Td (2 - Td or Tdap) 04/05/2029   Pneumonia Vaccine 68 Years old  Completed   INFLUENZA VACCINE  Completed   DEXA SCAN  Completed   Hepatitis C Screening  Completed   HPV VACCINES  Aged Out    Health Maintenance  Health Maintenance Due  Topic Date Due   Zoster Vaccines- Shingrix (1 of 2) Never done   COVID-19 Vaccine (3 - 2023-24 season) 02/12/2022    Colorectal cancer screening: Type of screening: Colonoscopy. Completed 05/26/20. Repeat every 5 years  Mammogram status: Completed 01/18/22. Repeat every year  Bone Density status: Completed  . Results reflect:  Bone density results: OSTEOPOROSIS. Repeat every   years.  Lung Cancer Screening: (Low Dose CT Chest recommended if Age 68-80years, 30 pack-year currently smoking OR have quit w/in 15years.) does not qualify.   Lung Cancer Screening Referral: n/a  Additional Screening:  Hepatitis C Screening: does qualify; Completed 02/04/20  Vision Screening: Recommended annual ophthalmology exams for early detection of glaucoma and other disorders of the eye. Is the patient up to date with their annual eye exam?  No  Who is the provider or what is the name of the office in which the patient attends annual eye exams? [facility]  If pt is not established with a provider, would they like to be referred to a provider to establish care?    .   Dental Screening: Recommended annual dental exams for proper oral hygiene  Community Resource Referral / Chronic Care Management: CRR  required this visit?  No   CCM required this visit?  No      Plan:     I have personally reviewed and noted the following in the patient's chart:   Medical and social history Use of alcohol, tobacco or illicit drugs  Current medications and supplements including opioid prescriptions. Patient is not currently taking opioid prescriptions. Functional ability and status Nutritional status Physical activity Advanced directives List of other physicians Hospitalizations, surgeries, and ER visits in previous 12 months Vitals Screenings to include cognitive, depression, and falls Referrals and appointments  In addition, I have reviewed and discussed with patient certain preventive protocols, quality metrics, and best practice recommendations. A written personalized care plan for preventive services as well as general preventive health recommendations were provided to patient.     Lillie [place], [place]   08/06/2022   Nurse Notes:

## 2022-08-27 ENCOUNTER — Other Ambulatory Visit: Payer: Self-pay

## 2022-09-06 ENCOUNTER — Other Ambulatory Visit: Payer: Medicare Other

## 2022-09-06 ENCOUNTER — Ambulatory Visit: Payer: Medicare Other | Admitting: Hematology and Oncology

## 2022-09-09 ENCOUNTER — Other Ambulatory Visit: Payer: Self-pay

## 2022-09-10 ENCOUNTER — Other Ambulatory Visit: Payer: Self-pay

## 2022-09-16 NOTE — Telephone Encounter (Signed)
Prolia VOB initiated via [email or web address]  Last OV: 03/29/22 Next OV:  Last Prolia inj 05/04/22 Next Prolia inj due 11/03/22

## 2022-09-28 NOTE — Telephone Encounter (Signed)
Pt ready for scheduling on or after 11/03/22  Out-of-pocket cost due at time of visit: $322  Primary: UHC AARP Medicare Adv HMO-POS Prolia co-insurance: 20% (approximately $302) Admin fee co-insurance: $20  Deductible: does not apply  Prior Auth; APPROVED PA# [id number] Valid:04/22/22-04/23/23  Secondary: N/A Prolia co-insurance:  Admin fee co-insurance:  Deductible:  Prior Auth:  PA# Valid:   ** This summary of benefits is an estimation of the patient's out-of-pocket cost. Exact cost may vary based on individual plan coverage.

## 2022-09-29 NOTE — Telephone Encounter (Signed)
Patient is scheduled for 11/09/2022 for next Prolia injection.

## 2022-10-04 ENCOUNTER — Other Ambulatory Visit: Payer: Self-pay

## 2022-10-04 ENCOUNTER — Other Ambulatory Visit: Payer: Self-pay | Admitting: Internal Medicine

## 2022-10-04 ENCOUNTER — Ambulatory Visit: Payer: Medicare Other | Admitting: Hematology and Oncology

## 2022-10-04 ENCOUNTER — Other Ambulatory Visit: Payer: Medicare Other

## 2022-10-04 ENCOUNTER — Other Ambulatory Visit: Payer: Self-pay | Admitting: Pharmacist

## 2022-10-04 DIAGNOSIS — G43109 Migraine with aura, not intractable, without status migrainosus: Secondary | ICD-10-CM

## 2022-10-04 DIAGNOSIS — E1159 Type 2 diabetes mellitus with other circulatory complications: Secondary | ICD-10-CM

## 2022-10-04 DIAGNOSIS — E1142 Type 2 diabetes mellitus with diabetic polyneuropathy: Secondary | ICD-10-CM

## 2022-10-04 MED ORDER — TOPIRAMATE 25 MG PO TABS
25.0000 mg | ORAL_TABLET | Freq: Every evening | ORAL | 1 refills | Status: DC | PRN
Start: 2022-10-04 — End: 2023-04-13
  Filled 2022-10-04: qty 90, 90d supply, fill #0

## 2022-10-04 MED ORDER — AMLODIPINE BESYLATE 5 MG PO TABS
7.5000 mg | ORAL_TABLET | Freq: Every day | ORAL | 1 refills | Status: DC
Start: 2022-10-04 — End: 2023-04-04
  Filled 2022-10-04: qty 135, 90d supply, fill #0
  Filled 2022-12-17 – 2023-01-10 (×2): qty 135, 90d supply, fill #1

## 2022-10-04 MED ORDER — DAPAGLIFLOZIN PROPANEDIOL 5 MG PO TABS
5.0000 mg | ORAL_TABLET | Freq: Every day | ORAL | 2 refills | Status: DC
  Filled 2022-10-04: qty 30, 30d supply, fill #0

## 2022-10-04 MED ORDER — AMLODIPINE BESYLATE 5 MG PO TABS
7.5000 mg | ORAL_TABLET | Freq: Every day | ORAL | 2 refills | Status: DC
Start: 2022-10-04 — End: 2022-10-04
  Filled 2022-10-04: qty 45, 30d supply, fill #0

## 2022-10-04 MED ORDER — TOPIRAMATE 25 MG PO TABS
25.0000 mg | ORAL_TABLET | Freq: Every evening | ORAL | 2 refills | Status: DC | PRN
Start: 2022-10-04 — End: 2022-10-04
  Filled 2022-10-04: qty 30, 30d supply, fill #0

## 2022-10-04 MED ORDER — DAPAGLIFLOZIN PROPANEDIOL 5 MG PO TABS
5.0000 mg | ORAL_TABLET | Freq: Every day | ORAL | 1 refills | Status: DC
  Filled 2022-10-04: qty 90, 90d supply, fill #0
  Filled 2022-12-17 – 2023-01-10 (×3): qty 90, 90d supply, fill #1
  Filled ????-??-?? (×2): fill #1

## 2022-10-05 ENCOUNTER — Other Ambulatory Visit: Payer: Self-pay

## 2022-10-05 MED ORDER — DAPAGLIFLOZIN PROPANEDIOL 5 MG PO TABS
5.0000 mg | ORAL_TABLET | Freq: Every day | ORAL | 1 refills | Status: DC
  Filled 2022-10-05: qty 90, 90d supply, fill #0
  Filled 2023-04-04: qty 30, 30d supply, fill #0
  Filled 2023-04-04: qty 180, 180d supply, fill #0

## 2022-10-05 NOTE — Telephone Encounter (Signed)
Requested Prescriptions  Pending Prescriptions Disp Refills   dapagliflozin propanediol (FARXIGA) 5 MG TABS tablet 90 tablet 1    Sig: Take 1 tablet (5 mg total) by mouth daily before breakfast.     Endocrinology:  Diabetes - SGLT2 Inhibitors Failed - 10/04/2022  3:14 PM      Failed - Cr in normal range and within 360 days    Creatinine  Date Value Ref Range Status  03/08/2022 1.41 (H) 0.44 - 1.00 mg/dL Final         Failed - eGFR in normal range and within 360 days    GFR calc Af Amer  Date Value Ref Range Status  12/24/2019 32 (L) >59 mL/min/1.73 Final    Comment:    **[facility] currently reports eGFR in compliance with the current**   recommendations of the [employer]. [facility] will   update reporting as new guidelines are published from the NKF-ASN   Task force.    GFR, Estimated  Date Value Ref Range Status  03/08/2022 41 (L) >60 mL/min Final    Comment:    (NOTE) Calculated using the CKD-EPI Creatinine Equation (2021)    GFR  Date Value Ref Range Status  11/30/2021 29.66 (L) >60.00 mL/min Final    Comment:    Calculated using the CKD-EPI Creatinine Equation (2021)   eGFR  Date Value Ref Range Status  11/03/2020 40 (L) >59 mL/min/1.73 Final         Passed - HBA1C is between 0 and 7.9 and within 180 days    HbA1c, POC (controlled diabetic range)  Date Value Ref Range Status  08/02/2022 7.4 (A) 0.0 - 7.0 % Final         Passed - Valid encounter within last 6 months    Recent Outpatient Visits           2 months ago Type 2 diabetes mellitus with morbid obesity (HCC)   [place] [facility] Jonah Blue B, MD   6 months ago Type 2 diabetes mellitus with peripheral neuropathy [facility])   [place] [facility] Jonah Blue B, MD   9 months ago Type 2 diabetes mellitus with peripheral neuropathy [facility])   [place] [facility] Jonah Blue B, MD   1 year ago Migraine  with aura and without status migrainosus, not intractable   [place] [facility] Blue B, MD   1 year ago Type 2 diabetes mellitus with morbid obesity [facility])   [place] [facility] Marcine Matar, MD       Future Appointments             In 2 months Laural Benes, Binnie Rail, MD [facility]

## 2022-10-06 ENCOUNTER — Other Ambulatory Visit: Payer: Self-pay | Admitting: Internal Medicine

## 2022-10-06 DIAGNOSIS — E1142 Type 2 diabetes mellitus with diabetic polyneuropathy: Secondary | ICD-10-CM

## 2022-10-07 NOTE — Telephone Encounter (Signed)
Unable to refill per protocol, Rx request is too soon. Last refill 12/30/21 for 90 and 3 refills.  Requested Prescriptions  Pending Prescriptions Disp Refills   gabapentin (NEURONTIN) 400 MG capsule [Pharmacy Med Name: Gabapentin 400 MG Oral Capsule] 200 capsule 2    Sig: TAKE 1 CAPSULE BY MOUTH TWICE  DAILY     Neurology: Anticonvulsants - gabapentin Failed - 10/06/2022 10:10 PM      Failed - Cr in normal range and within 360 days    Creatinine  Date Value Ref Range Status  03/08/2022 1.41 (H) 0.44 - 1.00 mg/dL Final         Passed - Completed PHQ-2 or PHQ-9 in the last 360 days      Passed - Valid encounter within last 12 months    Recent Outpatient Visits           2 months ago Type 2 diabetes mellitus with morbid obesity (HCC)   [place] [facility] Jon[facility] Blue B, MD   6 months ago Type 2 diabetes mellitus with peripheral neuropathy [facility])   [place] [facility] Jonah Blue B, MD   9 months ago Type 2 diabetes mellitus with peripheral neuropathy [facility])   [place] [facility] Jonah Blue B, MD   1 year ago Migraine with aura and without status migrainosus, not intractable   [place] [facility] Jonah Blue B, MD   1 year ago Type 2 diabetes mellitus with morbid obesity [facility])   [place] [facility] Marcine Matar, MD       Future Appointments             In 2 months Laural Benes, Binnie Rail, MD [facility]

## 2022-10-18 ENCOUNTER — Other Ambulatory Visit: Payer: Medicare Other

## 2022-10-18 ENCOUNTER — Ambulatory Visit: Payer: Medicare Other | Admitting: Hematology and Oncology

## 2022-10-22 ENCOUNTER — Other Ambulatory Visit: Payer: Self-pay

## 2022-10-27 ENCOUNTER — Other Ambulatory Visit: Payer: Self-pay

## 2022-10-29 ENCOUNTER — Ambulatory Visit: Payer: Medicare Other | Admitting: Hematology and Oncology

## 2022-10-29 ENCOUNTER — Other Ambulatory Visit: Payer: Medicare Other

## 2022-11-01 ENCOUNTER — Ambulatory Visit: Payer: Medicare Other | Admitting: Hematology and Oncology

## 2022-11-01 ENCOUNTER — Other Ambulatory Visit: Payer: Medicare Other

## 2022-11-09 ENCOUNTER — Ambulatory Visit: Payer: Medicare Other

## 2022-11-09 VITALS — BP 118/76 | HR 84 | Ht 63.0 in | Wt 208.0 lb

## 2022-11-09 DIAGNOSIS — M810 Age-related osteoporosis without current pathological fracture: Secondary | ICD-10-CM | POA: Diagnosis not present

## 2022-11-09 MED ORDER — DENOSUMAB 60 MG/ML ~~LOC~~ SOSY
60.0000 mg | PREFILLED_SYRINGE | Freq: Once | SUBCUTANEOUS | Status: AC
Start: 2022-11-09 — End: 2022-11-09
  Administered 2022-11-09: 60 mg via SUBCUTANEOUS

## 2022-11-09 NOTE — Progress Notes (Signed)
After obtaining consent, and per orders of Dr. Lonzo Cloud, injection of Prolia 60 mg given by Dylan Ruotolo L Greenlee Ancheta left arm . Patient instructed to remain in clinic for 20 minutes afterwards, and to report any adverse reaction to me immediately.

## 2022-11-11 ENCOUNTER — Other Ambulatory Visit: Payer: Self-pay

## 2022-11-12 NOTE — Telephone Encounter (Signed)
Last Prolia inj 11/09/22 Next Prolia inj due 05/13/23

## 2022-11-15 ENCOUNTER — Inpatient Hospital Stay: Payer: Medicare Other | Admitting: Hematology and Oncology

## 2022-11-15 ENCOUNTER — Inpatient Hospital Stay: Payer: Medicare Other | Attending: Internal Medicine

## 2022-11-15 ENCOUNTER — Other Ambulatory Visit: Payer: Self-pay | Admitting: Hematology and Oncology

## 2022-11-15 DIAGNOSIS — R809 Proteinuria, unspecified: Secondary | ICD-10-CM | POA: Diagnosis not present

## 2022-11-15 DIAGNOSIS — D72829 Elevated white blood cell count, unspecified: Secondary | ICD-10-CM

## 2022-11-15 DIAGNOSIS — E875 Hyperkalemia: Secondary | ICD-10-CM | POA: Diagnosis not present

## 2022-11-15 DIAGNOSIS — D75839 Thrombocytosis, unspecified: Secondary | ICD-10-CM

## 2022-11-15 DIAGNOSIS — I129 Hypertensive chronic kidney disease with stage 1 through stage 4 chronic kidney disease, or unspecified chronic kidney disease: Secondary | ICD-10-CM | POA: Diagnosis not present

## 2022-11-15 DIAGNOSIS — N1831 Chronic kidney disease, stage 3a: Secondary | ICD-10-CM | POA: Diagnosis not present

## 2022-11-15 DIAGNOSIS — N2581 Secondary hyperparathyroidism of renal origin: Secondary | ICD-10-CM | POA: Diagnosis not present

## 2022-11-15 DIAGNOSIS — E1129 Type 2 diabetes mellitus with other diabetic kidney complication: Secondary | ICD-10-CM | POA: Diagnosis not present

## 2022-11-15 DIAGNOSIS — D631 Anemia in chronic kidney disease: Secondary | ICD-10-CM | POA: Diagnosis not present

## 2022-11-15 DIAGNOSIS — E1122 Type 2 diabetes mellitus with diabetic chronic kidney disease: Secondary | ICD-10-CM | POA: Diagnosis not present

## 2022-11-15 NOTE — Progress Notes (Unsigned)
No show

## 2022-11-16 LAB — LAB REPORT - SCANNED
Creatinine, POC: 45.2 mg/dL
EGFR: 43

## 2022-11-18 ENCOUNTER — Other Ambulatory Visit: Payer: Self-pay

## 2022-11-18 MED ORDER — ERGOCALCIFEROL 1.25 MG (50000 UT) PO CAPS
50000.0000 [IU] | ORAL_CAPSULE | ORAL | 1 refills | Status: AC
  Filled 2022-11-18: qty 4, 28d supply, fill #0
  Filled 2022-12-17 – 2023-01-10 (×3): qty 4, 28d supply, fill #1

## 2022-11-29 ENCOUNTER — Other Ambulatory Visit: Payer: Self-pay

## 2022-12-02 ENCOUNTER — Other Ambulatory Visit: Payer: Self-pay | Admitting: Internal Medicine

## 2022-12-02 DIAGNOSIS — E1142 Type 2 diabetes mellitus with diabetic polyneuropathy: Secondary | ICD-10-CM

## 2022-12-03 NOTE — Telephone Encounter (Signed)
Requested Prescriptions  Pending Prescriptions Disp Refills   gabapentin (NEURONTIN) 400 MG capsule [Pharmacy Med Name: Gabapentin 400 MG Oral Capsule] 180 capsule 0    Sig: TAKE 1 CAPSULE BY MOUTH TWICE  DAILY     Neurology: Anticonvulsants - gabapentin Passed - 12/02/2022 10:21 PM      Passed - Cr in normal range and within 360 days    Creatinine  Date Value Ref Range Status  03/08/2022 1.41 (H) 0.44 - 1.00 mg/dL Final   Creatinine, POC  Date Value Ref Range Status  11/15/2022 45.2 mg/dL Final    Comment:    Abstracted by HIM         Passed - Completed PHQ-2 or PHQ-9 in the last 360 days      Passed - Valid encounter within last 12 months    Recent Outpatient Visits           4 months ago Type 2 diabetes mellitus with morbid obesity (HCC)   [place] [facility] Jonah Blue B, MD   8 months ago Type 2 diabetes mellitus with peripheral neuropathy [facility])   [place] [facility] Jonah Blue B, MD   11 months ago Type 2 diabetes mellitus with peripheral neuropathy [facility])   [place] [facility] Jonah Blue B, MD   1 year ago Migraine with aura and without status migrainosus, not intractable   [place] [facility] Blue B, MD   1 year ago Type 2 diabetes mellitus with morbid obesity [facility])   [place] [facility] Marcine Matar, MD       Future Appointments             In 3 days Marcine Matar, MD [facility]

## 2022-12-06 ENCOUNTER — Ambulatory Visit: Payer: Medicare Other | Attending: Internal Medicine | Admitting: Internal Medicine

## 2022-12-06 ENCOUNTER — Encounter: Payer: Self-pay | Admitting: Internal Medicine

## 2022-12-06 DIAGNOSIS — Z794 Long term (current) use of insulin: Secondary | ICD-10-CM | POA: Diagnosis not present

## 2022-12-06 DIAGNOSIS — Z7984 Long term (current) use of oral hypoglycemic drugs: Secondary | ICD-10-CM

## 2022-12-06 DIAGNOSIS — E1159 Type 2 diabetes mellitus with other circulatory complications: Secondary | ICD-10-CM

## 2022-12-06 DIAGNOSIS — N2581 Secondary hyperparathyroidism of renal origin: Secondary | ICD-10-CM | POA: Diagnosis not present

## 2022-12-06 DIAGNOSIS — E1169 Type 2 diabetes mellitus with other specified complication: Secondary | ICD-10-CM

## 2022-12-06 DIAGNOSIS — I152 Hypertension secondary to endocrine disorders: Secondary | ICD-10-CM | POA: Diagnosis not present

## 2022-12-06 DIAGNOSIS — N1831 Chronic kidney disease, stage 3a: Secondary | ICD-10-CM

## 2022-12-06 DIAGNOSIS — F320 Major depressive disorder, single episode, mild: Secondary | ICD-10-CM | POA: Insufficient documentation

## 2022-12-06 DIAGNOSIS — E559 Vitamin D deficiency, unspecified: Secondary | ICD-10-CM

## 2022-12-06 DIAGNOSIS — D75839 Thrombocytosis, unspecified: Secondary | ICD-10-CM

## 2022-12-06 DIAGNOSIS — Z6835 Body mass index (BMI) 35.0-35.9, adult: Secondary | ICD-10-CM

## 2022-12-06 DIAGNOSIS — N1832 Chronic kidney disease, stage 3b: Secondary | ICD-10-CM | POA: Diagnosis not present

## 2022-12-06 LAB — POCT GLYCOSYLATED HEMOGLOBIN (HGB A1C): HbA1c, POC (controlled diabetic range): 7.4 % — AB (ref 0.0–7.0)

## 2022-12-06 LAB — GLUCOSE, POCT (MANUAL RESULT ENTRY): POC Glucose: 178 mg/dl — AB (ref 70–99)

## 2022-12-06 NOTE — Patient Instructions (Signed)
Increase evening dose of insulin from 10 units to 12 units.  Diabetes mellitus y nutricin, en adultos Diabetes Mellitus and Nutrition, Adult Si sufre de diabetes, o diabetes mellitus, es muy importante tener hbitos alimenticios saludables debido a que sus niveles de Psychologist, counselling sangre (glucosa) se ven afectados en gran medida por lo que come y bebe. [place] alimentos saludables en las cantidades correctas, aproximadamente a la misma hora todos los [place], [place] ayudar a: Chief Operating Officer su glucemia. Disminuir el riesgo de sufrir una enfermedad cardaca. Mejorar la presin arterial. Barista o mantener un peso saludable. Qu puede afectar mi plan de alimentacin? Todas las personas que sufren de diabetes son diferentes y cada una tiene necesidades diferentes en cuanto a un plan de alimentacin. El mdico puede recomendarle que trabaje con un nutricionista para elaborar el mejor plan para usted. Su plan de alimentacin puede variar segn factores como: Las caloras que necesita. Los medicamentos que toma. Su peso. Sus niveles de glucemia, presin arterial y colesterol. Su nivel de [place]. Otras afecciones que tenga, como enfermedades cardacas o renales. Cmo me afectan los carbohidratos? Los carbohidratos, o hidratos de carbono, afectan su nivel de glucemia ms que cualquier otro tipo de alimento. La ingesta de carbohidratos aumenta la cantidad de [employer]. Es importante conocer la cantidad de carbohidratos que se pueden ingerir en cada comida sin correr Surveyor, minerals. Esto es Government social research officer. Su nutricionista puede ayudarlo a calcular la cantidad de carbohidratos que debe ingerir en cada comida y en cada refrigerio. Cmo me afecta el alcohol? El alcohol puede provocar una disminucin de la glucemia (hipoglucemia), especialmente si [place] insulina o toma determinados medicamentos por va oral para la diabetes. La hipoglucemia es una afeccin potencialmente mortal. Los sntomas de  la hipoglucemia, como somnolencia, mareos y confusin, son similares a los sntomas de haber consumido demasiado alcohol. No beba alcohol si: Su mdico le indica no hacerlo. Est embarazada, puede estar embarazada o est tratando de [place]. Si bebe alcohol: Limite la cantidad que bebe a lo siguiente: De 0 a 1 medida por da para las mujeres. De 0 a 2 medidas por da para los hombres. Sepa cunta cantidad de alcohol hay en las bebidas que toma. En los [address], una medida equivale a una botella de cerveza de 12 oz (355 ml), un vaso de vino de 5 oz (148 ml) o un vaso de una bebida alcohlica de alta graduacin de 1 oz (44 ml). Mantngase hidratado bebiendo agua, refrescos dietticos o t helado sin azcar. Tenga en cuenta que los refrescos comunes, los jugos y otras bebidas para mezclar pueden contener Product/process development scientist y se deben contar como carbohidratos. Consejos para seguir Social worker las etiquetas de los alimentos Comience por leer el tamao de la porcin en la etiqueta de Informacin nutricional de los alimentos envasados y las bebidas. La cantidad de caloras, carbohidratos, grasas y otros nutrientes detallados en la etiqueta se basan en una porcin del alimento. Muchos alimentos contienen ms de una porcin por envase. Verifique la cantidad total de gramos (g) de carbohidratos totales en una porcin. Verifique la cantidad de gramos de grasas saturadas y grasas trans en una porcin. Escoja alimentos que no contengan estas grasas o que su contenido de estas sea [place]. Verifique la cantidad de miligramos (mg) de sal (sodio) en una porcin. La [employer] de las personas deben limitar la ingesta de sodio total a menos de 2300 mg Google. Siempre consulte la informacin nutricional de  los alimentos etiquetados como "con bajo contenido de grasa" o "sin grasa". Estos alimentos pueden tener un mayor contenido de International aid/development worker agregada o carbohidratos refinados, y deben evitarse. Hable con su  nutricionista para identificar sus objetivos diarios en cuanto a los nutrientes mencionados en la etiqueta. Al ir de compras Evite comprar alimentos procesados, enlatados o precocidos. Estos alimentos tienden a Counselling psychologist mayor cantidad de [place], sodio y azcar agregada. Compre en la zona exterior de la tienda de comestibles. Esta es la zona donde se encuentran con mayor frecuencia las frutas y las verduras frescas, los cereales a granel, las carnes frescas y los productos lcteos frescos. Al cocinar Use mtodos de coccin a baja temperatura, como hornear, en lugar de mtodos de coccin a alta temperatura, como frer en abundante aceite. Cocine con aceites saludables, como el aceite de [place], canola o [place]. Evite cocinar con manteca, crema o carnes con alto contenido de grasa. Planificacin de las comidas Coma las comidas y los refrigerios regularmente, preferentemente a la misma hora todos [place]. Evite pasar largos perodos de tiempo sin comer. Consuma alimentos ricos en fibra, como frutas frescas, verduras, frijoles y cereales integrales. Consuma entre 4 y 6 onzas (entre 112 y 168 g) de protenas magras por da, como carnes [place], pollo, pescado, huevos o tofu. Una onza (oz) (28 g) de protena magra equivale a: 1 onza (28 g) de carne, pollo o pescado. 1 huevo.  taza (62 g) de tofu. Coma algunos alimentos por da que contengan grasas saludables, como aguacates, frutos secos, semillas y pescado. Qu alimentos debo comer? Nils Pyle Bayas. Manzanas. Naranjas. Duraznos. Damascos. Ciruelas. Uvas. Mangos. Papayas. Granadas. Kiwi. Cerezas. Verduras Verduras de [employer], que incluyen [place], [place], col rizada, acelga, hojas de berza, hojas de mostaza y repollo. Remolachas. Coliflor. Brcoli. Zanahorias. Judas verdes. Tomates. Pimientos. Cebollas. Pepinos. Coles de Bruselas. Granos Granos integrales, como panes, galletas, tortillas, cereales y pastas de salvado o integrales. Avena sin  azcar. Quinua. Arroz integral o salvaje. Carnes y otras protenas Frutos de mar. Carne de ave sin piel. Cortes magros de ave y carne de res. Tofu. Frutos secos. Semillas. Lcteos Productos lcteos sin grasa o con bajo contenido de [place], [place], yogur y [place]. Es posible que los productos detallados arriba no constituyan una lista completa de los alimentos y las bebidas que puede tomar. Consulte a un nutricionista para obtener ms informacin. Qu alimentos debo evitar? Nils Pyle Frutas enlatadas al almbar. Verduras Verduras enlatadas. Verduras congeladas con mantequilla o salsa de crema. Granos Productos elaborados con [place] y [place], como panes, pastas, bocadillos y cereales. Evite todos los alimentos procesados. Carnes y [address] de carne con alto contenido de Holiday representative. Carne de ave con piel. Carnes empanizadas o fritas. Carne procesada. Evite las grasas saturadas. Lcteos Yogur, queso o [employer]. Bebidas Bebidas azucaradas, como gaseosas o t helado. Es posible que los productos que se enumeran ms [place] no constituyan una lista completa de los alimentos y las bebidas que Personnel officer. Consulte a un nutricionista para obtener ms informacin. Preguntas para hacerle al mdico Debo consultar con un especialista certificado en atencin y educacin sobre la diabetes? Es necesario que me rena con un nutricionista? A qu nmero puedo llamar si tengo preguntas? Cules son los mejores momentos para controlar la glucemia? Dnde encontrar ms informacin: American Diabetes Association (Asociacin Estadounidense de la Diabetes): diabetes.org Academy of Nutrition and Dietetics (Academia de Nutricin y Pension scheme manager): eatright.Dana Corporation of Diabetes and Digestive and Kidney Diseases [employer] de  la Diabetes y las Enfermedades Digestivas y Renales): StageSync.si Association of Diabetes Care & Education Specialists (Asociacin de  Especialistas en Atencin y Educacin sobre la Diabetes): diabeteseducator.org Resumen Es importante tener hbitos alimenticios saludables debido a que sus niveles de Psychologist, counselling sangre (glucosa) se ven afectados en gran medida por lo que come y bebe. Es importante consumir alcohol con prudencia. Un plan de comidas saludable lo ayudar a controlar la glucosa en sangre y a reducir el riesgo de enfermedades cardacas. El mdico puede recomendarle que trabaje con un nutricionista para elaborar el mejor plan para usted. Esta informacin no tiene Theme park manager el consejo del mdico. Asegrese de hacerle al mdico cualquier pregunta que tenga. Document Revised: 02/06/2020 Document Reviewed: 02/06/2020 Elsevier Patient Education  2024 [employer].

## 2022-12-06 NOTE — Progress Notes (Signed)
Patient ID: Chelsea Coleman, female    DOB: 1955/10/19  MRN: [id number]  CC: Diabetes (DM f/u. /Vomiting on Monday, excessive fatigue/Instructed pt to bring shingles vax record)   Subjective: Chelsea Coleman is a 67 y.o. female who presents for chronic ds management Her concerns today include:  Pt with hx of DM neuropathy and retinopathy, HTN, HL, obesity, anemia of chronic ds, thrombocytosis/leukocytosis (followed by hematology), CKD 3b, ACD, Vit D, osteoporosis (Dr. Brooks Sailors), secondary hyperparathyroidism, migraines, chronic BL infarct cerebellar hemisphere, mix urinary incont.    AMN Language interpreter used during this encounter. #[id number] Pavlov   DM: Results for orders placed or performed in visit on 12/06/22  POCT glucose (manual entry)  Result Value Ref Range   POC Glucose 178 (A) 70 - 99 mg/dl  POCT glycosylated hemoglobin (Hb A1C)  Result Value Ref Range   Hemoglobin A1C     HbA1c POC (<> result, manual entry)     HbA1c, POC (prediabetic range)     HbA1c, POC (controlled diabetic range) 7.4 (A) 0.0 - 7.0 %  Reports compliance with insulin NPH/Reg70/30  20/10 units (should be 25/12 units.  She forgot she was suppose to be on this dosage) and [place].  A1C unchanged from previous visit. Checks BS 2-3 times a wk. Gives range 120-150.  Only one low blood sugar episode which was not severe  HTN/CKD 3: saw Dr. Malen Gauze 11/19/2022.  GFR 50 05/2022. On once a wk high dose vit D for Vit D def; vomited on Monday after taking the 50,000 mcg and mistakenly taking a different vit D supplement as well.  No further episodes.  Has secondary hyperparathyroidism and ACD associated  HL: Compliant with Lipitor.  Saw Dr. Leonides Schanz 11/15/2022 for f/u f/u leukocytosis/elev Plt:  PLT count improving and leukocytosis resolved.  Depression: Still not an issue for her at this time.  On last visit she told me that she was doing well and had discontinued her Zoloft as she did not feel she  needed it anymore.  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 12/28/2021   Mixed stress and urge urinary incontinence 12/28/2021   Class 2 severe obesity with serious comorbidity and body mass index (BMI) of 35.0 to 35.9 in adult [facility]) 12/28/2021   Secondary hyperparathyroidism (HCC) 12/02/2021   Age related osteoporosis 11/30/2021   Osteoporosis of multiple sites 04/21/2021   Leukocytosis 11/18/2020   Thrombocytosis 11/18/2020   Hypertension associated with stage 3 chronic kidney disease due to type 2 diabetes mellitus (HCC) 11/03/2020   Obesity (BMI 35.0-39.9 without comorbidity) 11/03/2020   Stage 3b chronic kidney disease (HCC) 04/06/2019   Hyperlipidemia associated with type 2 diabetes mellitus (HCC) 04/06/2019   Gastroesophageal reflux disease without esophagitis 04/06/2019   Type 2 diabetes, controlled, with peripheral neuropathy (HCC) 01/26/2019   Diabetic retinopathy associated with controlled type 2 diabetes mellitus (HCC) 01/26/2019   Essential hypertension 01/26/2019   Anemia 01/26/2019   Drug-induced constipation 01/26/2019   Memory difficulties 01/26/2019   Positive depression screening 01/26/2019     Current Outpatient Medications on File Prior to Visit  Medication Sig Dispense Refill   Accu-Chek FastClix Lancets MISC Use to check blood sugar 3 TIMES DAILY. (Patient taking differently: Use to check blood sugar 3 TIMES DAILY.) 100 each 2   amLODipine (NORVASC) 5 MG tablet Take 1.5 tablets (7.5 mg total) by mouth daily. 135 tablet  1   aspirin EC 81 MG tablet Take 1 tablet (81 mg total) by mouth daily. Swallow whole. (Patient not taking: Reported on 08/06/2022) 100 tablet 1   atorvastatin (LIPITOR) 20 MG tablet Take 1 tablet (20 mg total) by mouth daily. 90 tablet 1   Blood Glucose Monitoring Suppl (ACCU-CHEK GUIDE ME) w/Device KIT Use to check blood sugar 3 TIMES DAILY.  (Patient taking differently: Use to check blood sugar 3 TIMES DAILY.) 1 kit 0   clobetasol (TEMOVATE) 0.05 % external solution APPLY SMALL AMOUNT TO AFFECTED AREA 3 TIMES A WEEK AS NEEDED. 50 mL 1   Continuous Blood Gluc Receiver (FREESTYLE LIBRE READER) DEVI use as directed 1 each 0   Continuous Blood Gluc Sensor (FREESTYLE LIBRE SENSOR SYSTEM) MISC Change sensor every 2 weeks (14 days) 2 each 12   dapagliflozin propanediol (FARXIGA) 5 MG TABS tablet Take 1 tablet (5 mg total) by mouth daily before breakfast. 90 tablet 1   dapagliflozin propanediol (FARXIGA) 5 MG TABS tablet Take 1 tablet (5 mg total) by mouth daily. 90 tablet 1   ergocalciferol (VITAMIN D2) 1.25 MG (50000 UT) capsule Take 1 capsule (50,000 Units total) by mouth once a week. 4 capsule 1   fluticasone (FLONASE) 50 MCG/ACT nasal spray Place 1 spray into both nostrils daily. 16 g 0   gabapentin (NEURONTIN) 400 MG capsule TAKE 1 CAPSULE BY MOUTH TWICE  DAILY 180 capsule 0   glucose blood (ACCU-CHEK GUIDE) test strip Use to check blood sugar 3 TIMES DAILY. (Patient taking differently: Use to check blood sugar 3 TIMES DAILY.) 100 each 2   insulin NPH-regular Human (HUMULIN 70/30) (70-30) 100 UNIT/ML injection Inject 25 units in morning with breakfast and 12 units with dinner 10 mL 5   Insulin Pen Needle (TRUEPLUS PEN NEEDLES) 32G X 4 MM MISC Use to inject insulin twice daily. 100 each 2   Insulin Syringe-Needle U-100 (TRUEPLUS INSULIN SYRINGE) 31G X 5/16" 0.3 ML MISC Use to inject insulin twice daily 100 each 2   loratadine (CLARITIN) 10 MG tablet Take 1 tablet (10 mg total) by mouth daily. 30 tablet 1   losartan (COZAAR) 50 MG tablet TAKE 1 TABLET (50 MG TOTAL) BY MOUTH DAILY. 90 tablet 2   omeprazole (PRILOSEC) 20 MG capsule TAKE 1 CAPSULE (20 MG TOTAL) BY MOUTH DAILY. 30 capsule 3   OVER THE COUNTER MEDICATION Take 1 capsule by mouth at bedtime.     oxybutynin (DITROPAN XL) 5 MG 24 hr tablet Take 1 tablet (5 mg total) by mouth at  bedtime. 30 tablet 3   topiramate (TOPAMAX) 25 MG tablet Take 1 tablet (25 mg total) by mouth at bedtime as needed. 90 tablet 1   No current facility-administered medications on file prior to visit.    No Known Allergies  Social History   Socioeconomic History   Marital status: Married    Spouse name: Not on file   Number of children: 1   Years of education: 12 grade   Highest education level: Not on file  Occupational History   Occupation: unemployed  Tobacco Use   Smoking status: Never   Smokeless tobacco: Never  Vaping Use   Vaping Use: Never used  Substance and Sexual Activity   Alcohol use: Never   Drug use: Never   Sexual activity: Not on file  Other Topics Concern   Not on file  Social History Narrative   Not on file   Social Determinants of Health   Financial  Resource Strain: Medium Risk (08/06/2022)   Overall Financial Resource Strain (CARDIA)    Difficulty of Paying Living Expenses: Somewhat hard  Food Insecurity: Food Insecurity Present (08/06/2022)   Hunger Vital Sign    Worried About Running Out of Food in the Last Year: Sometimes true    Ran Out of Food in the Last Year: Sometimes true  Transportation Needs: Unmet Transportation Needs (08/06/2022)   PRAPARE - Transportation    Lack of Transportation (Medical): Yes    Lack of Transportation (Non-Medical): Yes  Physical Activity: Inactive (08/06/2022)   Exercise Vital Sign    Days of Exercise per Week: 0 days    Minutes of Exercise per Session: 0 min  Stress: No Stress Concern Present (08/06/2022)   [employer] of Occupational Health - Occupational Stress Questionnaire    Feeling of Stress : Not at all  Social Connections: Socially Isolated (08/06/2022)   Social Connection and Isolation Panel [NHANES]    Frequency of Communication with Friends and Family: Once a week    Frequency of Social Gatherings with Friends and Family: Never    Attends Religious Services: Never    Database administrator or  Organizations: No    Attends Engineer, structural: Never    Marital Status: Married  Catering manager Violence: Not on file    Family History  Problem Relation Age of Onset   Diabetes Mother    Colon cancer Neg Hx    Colon polyps Neg Hx    Esophageal cancer Neg Hx    Rectal cancer Neg Hx    Stomach cancer Neg Hx     Past Surgical History:  Procedure Laterality Date   ABDOMINAL HYSTERECTOMY     fibroid   CHOLECYSTECTOMY     INCONTINENCE SURGERY     REFRACTIVE SURGERY  2017   BL    ROS: Review of Systems Negative except as stated above  PHYSICAL EXAM: BP 119/74 (BP Location: Left Arm, Patient Position: Sitting, Cuff Size: Large)   Pulse 79   Temp 97.7 F (36.5 C) (Oral)   Ht 5\' 3"  (1.6 m)   Wt 201 lb (91.2 kg)   SpO2 95%   BMI 35.61 kg/m   Wt Readings from Last 3 Encounters:  12/06/22 201 lb (91.2 kg)  11/09/22 208 lb (94.3 kg)  08/02/22 201 lb 12.8 oz (91.5 kg)    Physical Exam  General appearance - alert, well appearing, older Hispanic female and in no distress.  She ambulates with a cane. Mental status - normal mood, behavior, speech, dress, motor activity, and thought processes Eyes - pupils equal and reactive, extraocular eye movements intact Neck - supple, no significant adenopathy Chest - clear to auscultation, no wheezes, rales or rhonchi, symmetric air entry Heart - normal rate, regular rhythm, normal S1, S2, no murmurs, rubs, clicks or gallops Extremities - peripheral pulses normal, no pedal edema, no clubbing or cyanosis     03/30/2022    1:41 PM 02/23/2021    3:02 PM 04/28/2020    1:35 PM  Depression screen PHQ 2/9  Decreased Interest 3 0 0  Down, Depressed, Hopeless  0 0  PHQ - 2 Score 3 0 0  Altered sleeping 3    Tired, decreased energy 3    Change in appetite 0    Feeling bad or failure about yourself  0    Trouble concentrating 0    Moving slowly or fidgety/restless 0    Suicidal thoughts 0  PHQ-9 Score 9           Latest Ref Rng & Units 03/08/2022    1:43 PM 11/30/2021    2:29 PM 09/08/2021   12:24 PM  CMP  Glucose 70 - 99 mg/dL 086  578  469   BUN 8 - 23 mg/dL 23  17  24    Creatinine 0.44 - 1.00 mg/dL 6.29  5.28  4.13   Sodium 135 - 145 mmol/L 134  132  136   Potassium 3.5 - 5.1 mmol/L 4.3  4.2  4.2   Chloride 98 - 111 mmol/L 101  98  104   CO2 22 - 32 mmol/L 28  27  26    Calcium 8.9 - 10.3 mg/dL 9.1  9.2  9.0   Total Protein 6.5 - 8.1 g/dL 8.0  7.9  7.7   Total Bilirubin 0.3 - 1.2 mg/dL 0.3  0.2  0.3   Alkaline Phos 38 - 126 U/L 90  100  99   AST 15 - 41 U/L 12  16  12    ALT 0 - 44 U/L 9  11  9     Lipid Panel     Component Value Date/Time   CHOL 166 12/28/2021 1125   TRIG 123 12/28/2021 1125   HDL 58 12/28/2021 1125   CHOLHDL 2.9 12/28/2021 1125   LDLCALC 86 12/28/2021 1125    CBC    Component Value Date/Time   WBC 10.3 04/19/2022 0824   RBC 3.70 (L) 04/19/2022 0824   HGB 11.0 (L) 04/19/2022 0824   HGB 11.4 (L) 03/08/2022 1343   HGB 11.5 11/03/2020 1104   HCT 34.8 (L) 04/19/2022 0824   HCT 35.2 11/03/2020 1104   PLT 430 (H) 04/19/2022 0824   PLT 450 (H) 03/08/2022 1343   PLT 541 (H) 11/03/2020 1104   MCV 94.1 04/19/2022 0824   MCV 90 11/03/2020 1104   MCH 29.7 04/19/2022 0824   MCHC 31.6 04/19/2022 0824   RDW 13.4 04/19/2022 0824   RDW 12.7 11/03/2020 1104   LYMPHSABS 1.7 04/19/2022 0824   MONOABS 0.7 04/19/2022 0824   EOSABS 0.4 04/19/2022 0824   BASOSABS 0.1 04/19/2022 0824    ASSESSMENT AND PLAN: 1. Type 2 diabetes mellitus with morbid obesity (HCC) A1c not at goal but stable. Advised that she continues Humulin 70/30 20 units in the mornings but increase evening dose to 12 units. Discussed and encourage healthy eating habits. - POCT glucose (manual entry) - POCT glycosylated hemoglobin (Hb A1C) - Microalbumin / creatinine urine ratio - Ambulatory referral to Ophthalmology - CBC - Comprehensive metabolic panel - Lipid panel  2. Hypertension associated with  diabetes (HCC) At goal.  Continue Norvasc 7.5 mg daily and Cozaar 50 mg daily  3. Stage 3a chronic kidney disease (CKD) (HCC) Continue to monitor.  Avoid NSAIDs. - Microalbumin / creatinine urine ratio - Comprehensive metabolic panel  4. Secondary hyperparathyroidism (HCC) Secondary to CKD.  Continue to monitor.  Followed by nephrology  5. Thrombocytosis Platelet count has been decreasing towards normal range.  Followed by Dr. Leonides Schanz.  6. Vitamin D deficiency Continue high-dose vitamin D once a week    Patient was given the opportunity to ask questions.  Patient verbalized understanding of the plan and was able to repeat key elements of the plan.   This documentation was completed using Paediatric nurse.  Any transcriptional errors are unintentional.  Orders Placed This Encounter  Procedures   Microalbumin / creatinine urine ratio  CBC   Comprehensive metabolic panel   Lipid panel   Ambulatory referral to Ophthalmology   POCT glucose (manual entry)   POCT glycosylated hemoglobin (Hb A1C)     Requested Prescriptions    No prescriptions requested or ordered in this encounter    Return in about 4 months (around 04/07/2023).  Jonah Blue, MD, FACP

## 2022-12-07 LAB — COMPREHENSIVE METABOLIC PANEL
ALT: 11 IU/L (ref 0–32)
AST: 17 IU/L (ref 0–40)
Albumin: 4 g/dL (ref 3.9–4.9)
Alkaline Phosphatase: 100 IU/L (ref 44–121)
BUN/Creatinine Ratio: 12 (ref 12–28)
BUN: 15 mg/dL (ref 8–27)
Bilirubin Total: <0.2 mg/dL (ref 0.0–1.2)
CO2: 24 mmol/L (ref 20–29)
Calcium: 9.1 mg/dL (ref 8.7–10.3)
Chloride: 101 mmol/L (ref 96–106)
Creatinine, Ser: 1.25 mg/dL — ABNORMAL HIGH (ref 0.57–1.00)
Globulin, Total: 3.6 g/dL (ref 1.5–4.5)
Glucose: 155 mg/dL — ABNORMAL HIGH (ref 70–99)
Potassium: 4.6 mmol/L (ref 3.5–5.2)
Sodium: 136 mmol/L (ref 134–144)
Total Protein: 7.6 g/dL (ref 6.0–8.5)
eGFR: 47 mL/min/{1.73_m2} — ABNORMAL LOW (ref >59)

## 2022-12-07 LAB — LIPID PANEL
Chol/HDL Ratio: 2.2 ratio (ref 0.0–4.4)
Cholesterol, Total: 130 mg/dL (ref 100–199)
HDL: 58 mg/dL (ref >39)
LDL Chol Calc (NIH): 53 mg/dL (ref 0–99)
Triglycerides: 106 mg/dL (ref 0–149)
VLDL Cholesterol Cal: 19 mg/dL (ref 5–40)

## 2022-12-07 LAB — CBC
Hematocrit: 38.8 % (ref 34.0–46.6)
Hemoglobin: 12.1 g/dL (ref 11.1–15.9)
MCH: 28.9 pg (ref 26.6–33.0)
MCHC: 31.2 g/dL — ABNORMAL LOW (ref 31.5–35.7)
MCV: 93 fL (ref 79–97)
Platelets: 467 10*3/uL — ABNORMAL HIGH (ref 150–450)
RBC: 4.18 x10E6/uL (ref 3.77–5.28)
RDW: 13.2 % (ref 11.7–15.4)
WBC: 9.9 10*3/uL (ref 3.4–10.8)

## 2022-12-07 LAB — MICROALBUMIN / CREATININE URINE RATIO
Creatinine, Urine: 56.9 mg/dL
Microalb/Creat Ratio: 15 mg/g creat (ref 0–29)
Microalbumin, Urine: 8.5 ug/mL

## 2022-12-17 ENCOUNTER — Other Ambulatory Visit: Payer: Self-pay | Admitting: Internal Medicine

## 2022-12-17 ENCOUNTER — Telehealth: Payer: Self-pay | Admitting: Internal Medicine

## 2022-12-17 ENCOUNTER — Other Ambulatory Visit: Payer: Self-pay

## 2022-12-17 ENCOUNTER — Other Ambulatory Visit (HOSPITAL_COMMUNITY): Payer: Self-pay

## 2022-12-17 DIAGNOSIS — E1169 Type 2 diabetes mellitus with other specified complication: Secondary | ICD-10-CM

## 2022-12-17 DIAGNOSIS — E1142 Type 2 diabetes mellitus with diabetic polyneuropathy: Secondary | ICD-10-CM

## 2022-12-17 DIAGNOSIS — Z1231 Encounter for screening mammogram for malignant neoplasm of breast: Secondary | ICD-10-CM

## 2022-12-17 MED ORDER — LOSARTAN POTASSIUM 50 MG PO TABS
50.0000 mg | ORAL_TABLET | Freq: Every day | ORAL | 1 refills | Status: DC
Start: 2022-12-17 — End: 2023-04-13
  Filled 2022-12-17 – 2023-01-10 (×2): qty 90, 90d supply, fill #0
  Filled 2023-04-04: qty 90, 90d supply, fill #1

## 2022-12-17 MED ORDER — ATORVASTATIN CALCIUM 20 MG PO TABS
20.0000 mg | ORAL_TABLET | Freq: Every day | ORAL | 3 refills | Status: DC
Start: 2022-12-17 — End: 2023-04-13
  Filled 2022-12-17 – 2023-01-10 (×2): qty 90, 90d supply, fill #0
  Filled 2023-04-04: qty 90, 90d supply, fill #1

## 2022-12-17 NOTE — Telephone Encounter (Signed)
Referral Request - Has patient seen PCP for this complaint? yes *If NO, is insurance requiring patient see PCP for this issue before PCP can refer them? Referral for which specialty: mammogram Preferred provider/office: ? Reason for referral: routine mammogram 

## 2022-12-17 NOTE — Telephone Encounter (Signed)
Routing to PCP for review.

## 2022-12-17 NOTE — Telephone Encounter (Signed)
Requested Prescriptions  Pending Prescriptions Disp Refills   losartan (COZAAR) 50 MG tablet 90 tablet 2    Sig: TAKE 1 TABLET (50 MG TOTAL) BY MOUTH DAILY.     Cardiovascular:  Angiotensin Receptor Blockers Failed - 12/17/2022 10:52 AM      Failed - Cr in normal range and within 180 days    Creatinine  Date Value Ref Range Status  03/08/2022 1.41 (H) 0.44 - 1.00 mg/dL Final   Creatinine, Ser  Date Value Ref Range Status  12/06/2022 1.25 (H) 0.57 - 1.00 mg/dL Final   Creatinine, POC  Date Value Ref Range Status  11/15/2022 45.2 mg/dL Final    Comment:    Abstracted by HIM         Passed - K in normal range and within 180 days    Potassium  Date Value Ref Range Status  12/06/2022 4.6 3.5 - 5.2 mmol/L Final         Passed - Patient is not pregnant      Passed - Last BP in normal range    BP Readings from Last 1 Encounters:  12/06/22 119/74         Passed - Valid encounter within last 6 months    Recent Outpatient Visits           1 week ago Type 2 diabetes mellitus with morbid obesity (HCC)   [place] [facility] Jonah Blue B, MD   4 months ago Type 2 diabetes mellitus with morbid obesity [facility])   [place] [facility] Jonah Blue B, MD   8 months ago Type 2 diabetes mellitus with peripheral neuropathy [facility])   [place] [facility] [place] [facility] Jonah Blue B, MD   11 months ago Type 2 diabetes mellitus with peripheral neuropathy [facility])   [place] [facility] Jonah Blue B, MD   1 year ago Migraine with aura and without status migrainosus, not intractable   [place] [facility] Marcine Matar, MD       Future Appointments             In 1 year Marcine Matar, MD [facility]             atorvastatin (LIPITOR) 20 MG tablet 90 tablet 1    Sig: Take 1 tablet (20 mg total) by mouth daily.      Cardiovascular:  Antilipid - Statins Failed - 12/17/2022 10:52 AM      Failed - Lipid Panel in normal range within the last 12 months    Cholesterol, Total  Date Value Ref Range Status  12/06/2022 130 100 - 199 mg/dL Final   LDL Chol Calc (NIH)  Date Value Ref Range Status  12/06/2022 53 0 - 99 mg/dL Final   HDL  Date Value Ref Range Status  12/06/2022 58 >39 mg/dL Final   Triglycerides  Date Value Ref Range Status  12/06/2022 106 0 - 149 mg/dL Final         Passed - Patient is not pregnant      Passed - Valid encounter within last 12 months    Recent Outpatient Visits           1 week ago Type 2 diabetes mellitus with morbid obesity (HCC)   [place] [facility] Jonah Blue B, MD   4 months ago Type  2 diabetes mellitus with morbid obesity [facility])   [place] [facility] Jonah Blue B, MD   8 months ago Type 2 diabetes mellitus with peripheral neuropathy [facility])   [place] [facility] Jonah Blue B, MD   11 months ago Type 2 diabetes mellitus with peripheral neuropathy [facility])   [place] [facility] Jonah Blue B, MD   1 year ago Migraine with aura and without status migrainosus, not intractable   [place] [facility] Marcine Matar, MD       Future Appointments             In 1 year Laural Benes, Binnie Rail, MD [facility]

## 2022-12-17 NOTE — Telephone Encounter (Signed)
Next Mammogram due 01/19/2024. Attempt to call patient to ask if they are having any breast concerns. No answer, LVM to return call.

## 2022-12-20 NOTE — Telephone Encounter (Signed)
Called LVM to call back

## 2022-12-22 NOTE — Telephone Encounter (Signed)
Called & spoke to the patient. Verified name & DOB. Inquired if there are any breast concerns. Patient stated that there is no concerns and only requested a mammogram referral for routine reasons only. Informed patient that a referral for her routine mammogram has been sent and patient will be contacted to schedule an appointment. Patient expressed verbal understanding of all discussed. No further questions at this time.

## 2022-12-24 ENCOUNTER — Other Ambulatory Visit: Payer: Self-pay

## 2023-01-03 ENCOUNTER — Other Ambulatory Visit: Payer: Self-pay

## 2023-01-07 ENCOUNTER — Other Ambulatory Visit: Payer: Self-pay

## 2023-01-10 ENCOUNTER — Other Ambulatory Visit: Payer: Self-pay

## 2023-01-11 ENCOUNTER — Other Ambulatory Visit: Payer: Self-pay

## 2023-01-12 ENCOUNTER — Telehealth: Payer: Self-pay | Admitting: Internal Medicine

## 2023-01-12 NOTE — Telephone Encounter (Signed)
Called & spoke to the patient. Verified name & DOB. Informed patient of name & telephone number of [facility]. Patient expressed verbal understanding. No further questions at this time.

## 2023-01-12 NOTE — Telephone Encounter (Signed)
Copied from CRM [id number]. Topic: Referral - Status >> Jan 11, 2023  5:00 PM Ja-Kwan M wrote: Reason for CRM: Pt stated she received a call for a mammogram but she does not have the name of the location or their contact information. Pt stated she needs to get this appt asap because she is having pain in her right breast.

## 2023-02-01 ENCOUNTER — Ambulatory Visit: Payer: Self-pay

## 2023-02-01 NOTE — Telephone Encounter (Signed)
  Chief Complaint:severe pelvic cramping, and vaginal bleeding  Symptoms: dizziness- passed clots and large amount of blood and clots x 1 day then spotting Frequency: 2 weeks cramping, bed last Monday  Pertinent Negatives: Patient denies back pain , pain with urination Disposition: [] ED /[] Urgent Care (no appt availability in office) / [x] Appointment(In office/virtual)/ []  [place] [facility]/ [] Home Care/ [] Refused Recommended Disposition /[] [place] [facility]/ []  Follow-up with PCP Additional Notes: pt stated that she did not have transportation to the agent and myself. Advised ED or UC and pt stated no transportation. Pt wanting pap smear and to come to office. Advised pt that 11 appt available and then she said she had transportation. So refused ED and U/C but accepted appt. Advised pt will send note to provider and may be advised to go to ED> Advised pt to go to ED if worsening pelvic cramping, dizziness or bleeding. Pt verbalized understanding. Reason for Disposition  [1] Constant abdominal pain AND [2] present > 2 hours  Answer Assessment - Initial Assessment Questions 1. AMOUNT: "Describe the bleeding that you are having." "How much bleeding is there?"    - SPOTTING: spotting, or pinkish / brownish mucous discharge; does not fill panty liner or pad    - MILD:  less than 1 pad / hour; less than patient's usual menstrual bleeding   - MODERATE: 1-2 pads / hour; 1 menstrual cup every 6 hours; small-medium blood clots (e.g., pea, grape, small coin)   - SEVERE: soaking 2 or more pads/hour for 2 or more hours; 1 menstrual cup every 2 hours; bleeding not contained by pads or continuous red blood from vagina; large blood clots (e.g., golf ball, large coin)      Just one time clots also sthen spotting 2. ONSET: "When did the bleeding begin?" "Is it continuing now?"     Wednesday  4. ABDOMEN PAIN: "Do you have any pain?" "How bad is the pain?"  (e.g., Scale 1-10; mild, moderate, or severe)    - MILD (1-3): doesn't interfere with normal activities, abdomen soft and not tender to touch    - MODERATE (4-7): interferes with normal activities or awakens from sleep, abdomen tender to touch    - SEVERE (8-10): excruciating pain, doubled over, unable to do any normal activities      8/10 cramping constant - Last Monday  5. BLOOD THINNERS: "Do you take any blood thinners?" (e.g., Coumadin/warfarin, Pradaxa/dabigatran, aspirin)     *No Answer* 8. HEMODYNAMIC STATUS: "Are you weak or feeling lightheaded?" If Yes, ask: "Can you stand and walk normally?"       Dizziness 1-2 months  9. OTHER SYMPTOMS: "What other symptoms are you having with the bleeding?" (e.g., back pain, burning with urination, fever)  Protocols used: Vaginal Bleeding - Postmenopausal-A-AH

## 2023-02-01 NOTE — Telephone Encounter (Signed)
Pt has appointment tomorrow with PA.

## 2023-02-02 ENCOUNTER — Telehealth: Payer: Medicare Other | Admitting: Physician Assistant

## 2023-02-02 DIAGNOSIS — E1169 Type 2 diabetes mellitus with other specified complication: Secondary | ICD-10-CM

## 2023-02-02 NOTE — Telephone Encounter (Addendum)
Call placed to patient unable to reach or leave a message.  the call kept ending before I could complete message.

## 2023-02-02 NOTE — Telephone Encounter (Signed)
Patient called the office- she does not want to have a virtual visit with provider- patient advised this would give her a ood start to assessing her symptoms but she declines- she wants to be seen in person by her PCP. Offered mobile unit as option and she declines also. Patient advised there are no open appointments and I will have to send message to office due to lunch hour.

## 2023-02-02 NOTE — Telephone Encounter (Signed)
Patient returned call . Appointment reschedule to 02/16/2023. Interpreter [phone]

## 2023-02-02 NOTE — Telephone Encounter (Signed)
Call placed to patient unable to reach message left on VM.   

## 2023-02-10 ENCOUNTER — Other Ambulatory Visit: Payer: Self-pay | Admitting: Internal Medicine

## 2023-02-10 DIAGNOSIS — E1142 Type 2 diabetes mellitus with diabetic polyneuropathy: Secondary | ICD-10-CM

## 2023-02-16 ENCOUNTER — Ambulatory Visit: Payer: Medicare Other | Attending: Physician Assistant | Admitting: Physician Assistant

## 2023-02-16 ENCOUNTER — Encounter: Payer: Self-pay | Admitting: Physician Assistant

## 2023-02-16 ENCOUNTER — Other Ambulatory Visit: Payer: Self-pay

## 2023-02-16 VITALS — BP 134/77 | HR 76 | Wt 201.2 lb

## 2023-02-16 DIAGNOSIS — Z7984 Long term (current) use of oral hypoglycemic drugs: Secondary | ICD-10-CM

## 2023-02-16 DIAGNOSIS — Z794 Long term (current) use of insulin: Secondary | ICD-10-CM

## 2023-02-16 DIAGNOSIS — M549 Dorsalgia, unspecified: Secondary | ICD-10-CM

## 2023-02-16 DIAGNOSIS — Z603 Acculturation difficulty: Secondary | ICD-10-CM | POA: Diagnosis not present

## 2023-02-16 DIAGNOSIS — R7989 Other specified abnormal findings of blood chemistry: Secondary | ICD-10-CM | POA: Diagnosis not present

## 2023-02-16 DIAGNOSIS — E1165 Type 2 diabetes mellitus with hyperglycemia: Secondary | ICD-10-CM | POA: Diagnosis not present

## 2023-02-16 DIAGNOSIS — Z758 Other problems related to medical facilities and other health care: Secondary | ICD-10-CM

## 2023-02-16 DIAGNOSIS — N950 Postmenopausal bleeding: Secondary | ICD-10-CM

## 2023-02-16 DIAGNOSIS — D649 Anemia, unspecified: Secondary | ICD-10-CM | POA: Diagnosis not present

## 2023-02-16 LAB — GLUCOSE, POCT (MANUAL RESULT ENTRY): POC Glucose: 163 mg/dL — AB (ref 70–99)

## 2023-02-16 MED ORDER — METHOCARBAMOL 500 MG PO TABS
500.0000 mg | ORAL_TABLET | Freq: Four times a day (QID) | ORAL | 0 refills | Status: DC | PRN
Start: 2023-02-16 — End: 2023-05-23
  Filled 2023-02-16 (×2): qty 90, 23d supply, fill #0

## 2023-02-16 NOTE — Patient Instructions (Signed)
Hemorragia posmenopusica Postmenopausal Bleeding La hemorragia posmenopusica es el sangrado que tiene una mujer despus de haber entrado en la menopausia. La menopausia es el final de la edad frtil de la [place]. Despus de la menopausia, una mujer deja de ovular, por lo que deja de tener perodos menstruales. Por lo tanto, ya no debera tener hemorragias vaginales. La hemorragia posmenopusica podra tener varias causas; entre ellas: Terapia hormonal para la menopausia. Atrofia del endometrio. Despus de la menopausia, los bajos niveles de la hormona estrgeno hacen que la membrana que recubre el tero (endometrio) se afine. Podra tener sangrado mientras el endometrio se afina. Hiperplasia de endometrio. Esta afeccin es consecuencia de un exceso de la hormona estrgeno y de bajos niveles de la hormona progesterona. El exceso de estrgeno hace que el endometrio se engrose, lo que puede provocar sangrado. En algunos casos, esto puede provocar cncer de tero. Cncer de endometrio. Crecimientos no cancerosos (plipos) en el endometrio, el recubrimiento del tero, o en el cuello uterino. Fibromas uterinos. Estos son crecimientos no cancerosos dentro o alrededor del tejido muscular del tero, que pueden provocar un sangrado abundante. Todo tipo de sangrado posmenopusico, incluso si parece ser un perodo menstrual normal, debe ser [employer] su mdico. El tratamiento depender de la causa del sangrado. Siga estas instrucciones en su casa:  Est atento a cualquier cambio en los sntomas. Informe a su mdico acerca de sus sntomas. Evite las duchas vaginales y el uso de tampones como se lo haya indicado el mdico. Cmbiese las toallas higinicas de forma regular. Realcese exmenes plvicos peridicos, que incluyen pruebas de [place], con la frecuencia que le indique el mdico. Tome los suplementos de hierro como se lo haya indicado el mdico. Use los medicamentos de venta libre y los recetados  solamente como se lo haya indicado el mdico. Cumpla con todas las visitas de seguimiento. Esto es importante. Comunquese con un mdico si: Tiene un nuevo sangrado de la vagina despus de la menopausia. Tiene dolor en el abdomen. Solicite ayuda de inmediato si: Tiene fiebre o escalofros. Tiene dolor intenso con el sangrado. Elimina cogulos de [place]. Tiene un sangrado abundante, necesita ms de 1 toalla higinica por hora y nunca present esto antes. Tiene dolores de [place] o se siente dbil o mareada. Resumen La hemorragia posmenopusica es el sangrado que tiene una mujer despus de haber entrado en la menopausia. El sangrado posmenopusico podra tener varias causas. El tratamiento depender de la causa del sangrado. Todo tipo de sangrado posmenopusico, incluso si parece ser un perodo menstrual normal, debe ser evaluado por su mdico. Asegrese de estar atenta a cualquier cambio en los sntomas y concurrir a todas las visitas de control. Esta informacin no tiene Theme park manager el consejo del mdico. Asegrese de hacerle al mdico cualquier pregunta que tenga. Document Revised: 01/01/2020 Document Reviewed: 01/01/2020 Elsevier Patient Education  2024 [employer].

## 2023-02-16 NOTE — Progress Notes (Signed)
Patient ID: Chelsea Coleman, female   DOB: Sep 20, 1955, 67 y.o.   MRN: [id number]     Chelsea Coleman, is a 67 y.o. female  BJY:[id number]  [id number]  DOB - Jul 08, 1955  Chief Complaint  Patient presents with   Vaginal Bleeding       Subjective:   Chelsea Coleman is a 67 y.o. female here today for vaginal bleeding that started about 1 month ago.  She had a total hysterectomy about 21 years ago and everything has been fine up until a month ago.  Back pain and some pelvic cramping about every 8 days then bright red blood coming from vagina for a day after she pees.  She says she feels like she is in labor when the pain in her back comes.  She says the bleeding has been after urinating.  Not associated with defecation.  No weakness.  No dizziness.  She is not taking aspirin and does not drink alcohol.  She says her vagina feels a little swollen before the blood comes out.    No problems updated.  ALLERGIES: No Known Allergies  PAST MEDICAL HISTORY: Past Medical History:  Diagnosis Date   Cataract    states removed in 2014   Chronic kidney disease 02/14/2020   Stage 3    Diabetes mellitus without complication (HCC)    GERD (gastroesophageal reflux disease)    Hypertension     MEDICATIONS AT HOME: Prior to Admission medications   Medication Sig Start Date End Date Taking? Authorizing Provider  amLODipine (NORVASC) 5 MG tablet Take 1.5 tablets (7.5 mg total) by mouth daily. 10/04/22  Yes Marcine Matar, MD  atorvastatin (LIPITOR) 20 MG tablet Take 1 tablet (20 mg total) by mouth daily. 12/17/22  Yes Marcine Matar, MD  dapagliflozin propanediol (FARXIGA) 5 MG TABS tablet Take 1 tablet (5 mg total) by mouth daily before breakfast. 10/05/22  Yes Marcine Matar, MD  insulin NPH-regular Human (HUMULIN 70/30) (70-30) 100 UNIT/ML injection Inject 25 units in morning with breakfast and 12 units with dinner 08/02/22  Yes Marcine Matar, MD  methocarbamol (ROBAXIN)  500 MG tablet Take 1 tablet (500 mg total) by mouth every 6 (six) hours as needed. 02/16/23  Yes Delonte Musich, Marzella Schlein, PA-C  Accu-Chek FastClix Lancets MISC Use to check blood sugar 3 TIMES DAILY. Patient taking differently: Use to check blood sugar 3 TIMES DAILY. 11/03/20   Marcine Matar, MD  aspirin EC 81 MG tablet Take 1 tablet (81 mg total) by mouth daily. Swallow whole. Patient not taking: Reported on 08/06/2022 12/28/21   Marcine Matar, MD  Blood Glucose Monitoring Suppl (ACCU-CHEK GUIDE ME) w/Device KIT Use to check blood sugar 3 TIMES DAILY. Patient taking differently: Use to check blood sugar 3 TIMES DAILY. 11/03/20   Marcine Matar, MD  clobetasol (TEMOVATE) 0.05 % external solution APPLY SMALL AMOUNT TO AFFECTED AREA 3 TIMES A WEEK AS NEEDED. 03/30/21   Marcine Matar, MD  Continuous Blood Gluc Receiver (FREESTYLE LIBRE READER) DEVI use as directed 12/28/21   Marcine Matar, MD  Continuous Blood Gluc Sensor (FREESTYLE LIBRE SENSOR SYSTEM) MISC Change sensor every 2 weeks (14 days) 12/28/21   Marcine Matar, MD  dapagliflozin propanediol (FARXIGA) 5 MG TABS tablet Take 1 tablet (5 mg total) by mouth daily. 10/04/22   Marcine Matar, MD  ergocalciferol (VITAMIN D2) 1.25 MG (50000 UT) capsule Take 1 capsule (50,000 Units total) by mouth once a week.  11/18/22   Estanislado Emms, MD  fluticasone (FLONASE) 50 MCG/ACT nasal spray Place 1 spray into both nostrils daily. 08/02/22   Marcine Matar, MD  gabapentin (NEURONTIN) 400 MG capsule TAKE 1 CAPSULE BY MOUTH TWICE  DAILY 02/11/23   Hoy Register, MD  glucose blood (ACCU-CHEK GUIDE) test strip Use to check blood sugar 3 TIMES DAILY. Patient taking differently: Use to check blood sugar 3 TIMES DAILY. 11/03/20   Marcine Matar, MD  Insulin Pen Needle (TRUEPLUS PEN NEEDLES) 32G X 4 MM MISC Use to inject insulin twice daily. 12/01/20   Marcine Matar, MD  Insulin Syringe-Needle U-100 (TRUEPLUS INSULIN SYRINGE) 31G X 5/16"  0.3 ML MISC Use to inject insulin twice daily 07/06/22   Marcine Matar, MD  loratadine (CLARITIN) 10 MG tablet Take 1 tablet (10 mg total) by mouth daily. 08/02/22   Marcine Matar, MD  losartan (COZAAR) 50 MG tablet Take 1 tablet (50 mg total) by mouth daily. 12/17/22   Marcine Matar, MD  omeprazole (PRILOSEC) 20 MG capsule TAKE 1 CAPSULE (20 MG TOTAL) BY MOUTH DAILY. 07/06/22   Marcine Matar, MD  OVER THE COUNTER MEDICATION Take 1 capsule by mouth at bedtime.    [provider]  oxybutynin (DITROPAN XL) 5 MG 24 hr tablet Take 1 tablet (5 mg total) by mouth at bedtime. 08/02/22   Marcine Matar, MD  topiramate (TOPAMAX) 25 MG tablet Take 1 tablet (25 mg total) by mouth at bedtime as needed. 10/04/22   Marcine Matar, MD    ROS: Neg HEENT Neg resp Neg cardiac Neg GI Neg MS Neg psych Neg neuro  Objective:   Vitals:   02/16/23 0922  BP: 134/77  Pulse: 76  SpO2: 99%  Weight: 201 lb 3.2 oz (91.3 kg)   Exam General appearance : Awake, alert, not in any distress. Speech Clear. Not toxic looking HEENT: Atraumatic and Normocephalic Neck: Supple, no JVD. No cervical lymphadenopathy.  Chest: Good air entry bilaterally, CTAB.  No rales/rhonchi/wheezing CVS: S1 S2 regular, no murmurs.  Extremities: B/L Lower Ext shows no edema, both legs are warm to touch Neurology: Awake alert, and oriented X 3, CN II-XII intact, Non focal Skin: No Rash  Data Review Lab Results  Component Value Date   HGBA1C 7.4 (A) 12/06/2022   HGBA1C 7.4 (A) 08/02/2022   HGBA1C 7.4 (A) 03/30/2022    Assessment & Plan   1. Type 2 diabetes mellitus with hyperglycemia, unspecified whether long term insulin use (HCC)  work at a goal of eliminating sugary drinks, candy, desserts, sweets, refined sugars, processed foods, and white carbohydrates.  Continue current regimen   - Glucose (CBG)  2. Post-menopausal bleeding Uncertain etiology.  To ED if worsens.   - TSH - CBC with  Differential - Ambulatory referral to Gynecology  3. Acute bilateral back pain, unspecified back location Take tylenol and  - methocarbamol (ROBAXIN) 500 MG tablet; Take 1 tablet (500 mg total) by mouth every 6 (six) hours as needed.  Dispense: 90 tablet; Refill: 0  4. AMN interpreters used and additional time performing visit was required.  "[place]"   Return in about 3 months (around 05/18/2023) for PCP for chronic conditions.  The patient was given clear instructions to go to ER or return to medical center if symptoms don't improve, worsen or new problems develop. The patient verbalized understanding. The patient was told to call to get lab results if they haven't heard anything in the  next week.      Georgian Co, PA-C [facility] [place], [place] [phone]   02/16/2023, 9:36 AM

## 2023-02-17 LAB — CBC WITH DIFFERENTIAL/PLATELET
Basophils Absolute: 0.1 10*3/uL (ref 0.0–0.2)
Basos: 1 %
EOS (ABSOLUTE): 0.2 10*3/uL (ref 0.0–0.4)
Eos: 1 %
Hematocrit: 36.7 % (ref 34.0–46.6)
Hemoglobin: 11.8 g/dL (ref 11.1–15.9)
Immature Grans (Abs): 0 10*3/uL (ref 0.0–0.1)
Immature Granulocytes: 0 %
Lymphocytes Absolute: 1.9 10*3/uL (ref 0.7–3.1)
Lymphs: 19 %
MCH: 29.4 pg (ref 26.6–33.0)
MCHC: 32.2 g/dL (ref 31.5–35.7)
MCV: 91 fL (ref 79–97)
Monocytes Absolute: 0.6 10*3/uL (ref 0.1–0.9)
Monocytes: 6 %
Neutrophils Absolute: 7.7 10*3/uL — ABNORMAL HIGH (ref 1.4–7.0)
Neutrophils: 73 %
Platelets: 464 10*3/uL — ABNORMAL HIGH (ref 150–450)
RBC: 4.02 x10E6/uL (ref 3.77–5.28)
RDW: 12.9 % (ref 11.7–15.4)
WBC: 10.4 10*3/uL (ref 3.4–10.8)

## 2023-02-17 LAB — TSH: TSH: 2.35 u[IU]/mL (ref 0.450–4.500)

## 2023-02-18 ENCOUNTER — Telehealth: Payer: Self-pay

## 2023-02-18 NOTE — Telephone Encounter (Signed)
-----   Message from Georgian Co sent at 02/17/2023  1:15 PM EDT ----- Please call patient.  Blood count and thyroid are normal.  Thanks, Georgian Co, PA-C

## 2023-02-18 NOTE — Telephone Encounter (Signed)
Pt was called and vm was left, Information has been sent to nurse pool.   Interpreter id # [phone]

## 2023-02-23 ENCOUNTER — Telehealth: Payer: Self-pay | Admitting: Internal Medicine

## 2023-02-23 NOTE — Telephone Encounter (Signed)
Patient has called and stated that the Gynecologist she was referred to does not accept her insurance & she needs this referral to be sent somewhere else that will accept her insurance. Patient prefers a female hispanic gynecologist. Please advise and update patient when the referral is placed to another office. Patients callback # [phone].

## 2023-03-02 ENCOUNTER — Ambulatory Visit: Payer: Self-pay

## 2023-03-02 DIAGNOSIS — N950 Postmenopausal bleeding: Secondary | ICD-10-CM

## 2023-03-02 NOTE — Telephone Encounter (Signed)
Pt returning missed a missed call. She stated she needs a referral to be sent as soon as possible as she is experiencing severe abdominal cramping and vaginal bleeding every 8 days. Stated as long as she is offered a Research officer, trade union and the gynecologist accepts her insurance, she will see anyone.   Please advise.

## 2023-03-02 NOTE — Telephone Encounter (Signed)
Using [employer] [id number].  Chief Complaint: Abnormal Vaginal bleeding  Symptoms: mild vaginal bleeding, abdominal pain, back pain  Frequency: comes and goes about every 8 days or so  Pertinent Negatives: Patient denies heavy bleeding, feeling lightheaded Disposition: [] ED /[] Urgent Care (no appt availability in office) / [] Appointment(In office/virtual)/ []  [place] [facility]/ [] Home Care/ [] Refused Recommended Disposition /[] [place] [facility]/ [x]  Follow-up with PCP Additional Notes: Patient states that the mild vagina bleeding continues to occur about every 8 days. The abdominal pain is severe when the bleeding occurs and she takes tylenol to relieve the pain. Patient states she would like to a GYN provider ASAP and a referral has been place. Patient states she will see any provider available on a Monday, they do not have to speak spanish like she originally requested as long as interpreter services are available. Care advice given and advised patient I would forward message to PCP. Patient verbalized understanding. Advised if symptoms get worse to callback.   Reason for Disposition  Postmenopausal vaginal bleeding  Answer Assessment - Initial Assessment Questions 1. AMOUNT: "Describe the bleeding that you are having." "How much bleeding is there?"    - SPOTTING: spotting, or pinkish / brownish mucous discharge; does not fill panty liner or pad    - MILD:  less than 1 pad / hour; less than patient's usual menstrual bleeding   - MODERATE: 1-2 pads / hour; 1 menstrual cup every 6 hours; small-medium blood clots (e.g., pea, grape, small coin)   - SEVERE: soaking 2 or more pads/hour for 2 or more hours; 1 menstrual cup every 2 hours; bleeding not contained by pads or continuous red blood from vagina; large blood clots (e.g., golf ball, large coin)      Mild  2. ONSET: "When did the bleeding begin?" "Is it continuing now?"     Every 8 days I am bleeding  3. MENOPAUSE:  "When was your last menstrual period?"      Years ago  4. ABDOMEN PAIN: "Do you have any pain?" "How bad is the pain?"  (e.g., Scale 1-10; mild, moderate, or severe)   - MILD (1-3): doesn't interfere with normal activities, abdomen soft and not tender to touch    - MODERATE (4-7): interferes with normal activities or awakens from sleep, abdomen tender to touch    - SEVERE (8-10): excruciating pain, doubled over, unable to do any normal activities      Severe pain  5. BLOOD THINNERS: "Do you take any blood thinners?" (e.g., Coumadin/warfarin, Pradaxa/dabigatran, aspirin)     No  6. HORMONE MEDICINES: "Are you taking any hormone medicines, prescription or OTC?" (e.g., birth control pills, estrogen)     No 7. CAUSE: "What do you think is causing the bleeding?" (e.g., recent gyn surgery, recent gyn procedure; known bleeding disorder, uterine cancer)       I'm afraid it might be cancer  8. HEMODYNAMIC STATUS: "Are you weak or feeling lightheaded?" If Yes, ask: "Can you stand and walk normally?"       No I don't feel lightheaded but a little weak  9. OTHER SYMPTOMS: "What other symptoms are you having with the bleeding?" (e.g., back pain, burning with urination, fever)     Back and abdominal pain  Protocols used: Vaginal Bleeding - Postmenopausal-A-AH

## 2023-03-02 NOTE — Addendum Note (Signed)
Addended by: Jonah Blue B on: 03/02/2023 05:44 PM   Modules accepted: Orders

## 2023-03-03 ENCOUNTER — Telehealth: Payer: Self-pay

## 2023-03-03 DIAGNOSIS — N950 Postmenopausal bleeding: Secondary | ICD-10-CM

## 2023-03-03 NOTE — Addendum Note (Signed)
Addended by: Jonah Blue B on: 03/03/2023 09:34 PM   Modules accepted: Orders

## 2023-03-03 NOTE — Telephone Encounter (Signed)
Copied from CRM [id number]. Topic: General - Inquiry >> Mar 03, 2023 10:14 AM Phill Myron wrote:  Joyce Gross confirming if patient just needs Trans abdominal only  or does she need a transabdominal and TransVaginal. Please advise as soon as possible.  So that we can get the patient on the schedule  GI [phone]  option 1 then option 5

## 2023-03-04 NOTE — Telephone Encounter (Signed)
Called [employer] and spoke to [place]. Confirmed that Dr.Johnson re-entered the order for pelvic US complete with transvaginal. Desiree expressed verbal understanding. No further questions at this time.

## 2023-03-04 NOTE — Telephone Encounter (Signed)
Called but no answer. LVM to call back.

## 2023-03-09 NOTE — Telephone Encounter (Signed)
Called but no answer. LVM to call back.

## 2023-03-10 ENCOUNTER — Telehealth (INDEPENDENT_AMBULATORY_CARE_PROVIDER_SITE_OTHER): Payer: Self-pay | Admitting: Internal Medicine

## 2023-03-10 NOTE — Telephone Encounter (Signed)
Called but no answer. LVM to call back.  

## 2023-03-10 NOTE — Telephone Encounter (Signed)
Copied from CRM [id number]. Topic: Referral - Request for Referral >> Mar 10, 2023 11:00 AM Phill Myron wrote: Reason for CRM:  Chelsea Coleman is requesting a referral for a mammogram. Please advise

## 2023-03-14 ENCOUNTER — Ambulatory Visit
Admission: RE | Admit: 2023-03-14 | Discharge: 2023-03-14 | Disposition: A | Discharge status: Home / Self Care | Payer: Medicare Other | Source: Ambulatory Visit | Attending: Internal Medicine | Admitting: Internal Medicine

## 2023-03-14 DIAGNOSIS — N950 Postmenopausal bleeding: Secondary | ICD-10-CM

## 2023-03-15 NOTE — Telephone Encounter (Signed)
Called but no answer. LVM to call back.  

## 2023-03-16 ENCOUNTER — Other Ambulatory Visit: Payer: Self-pay

## 2023-03-16 ENCOUNTER — Telehealth: Payer: Self-pay

## 2023-03-16 ENCOUNTER — Ambulatory Visit: Payer: Medicare Other | Admitting: Physician Assistant

## 2023-03-16 DIAGNOSIS — Z1231 Encounter for screening mammogram for malignant neoplasm of breast: Secondary | ICD-10-CM

## 2023-03-16 NOTE — Telephone Encounter (Signed)
Called & spoke to the patient. Verified name & DOB. Patient requested a mammogram referral. Offered patient to schedule an appointment with the mobile screening unit on 03/26/2023. Patient declined due to son having work on Saturdays. Entered new order for patient to be seen at [facility]. Informed patient of office address and phone number. No further questions at this time.

## 2023-03-16 NOTE — Telephone Encounter (Signed)
Pt is calling in returning a call from the office regarding a mammogram.

## 2023-03-16 NOTE — Telephone Encounter (Signed)
Called but no answer. LVM to call back.  

## 2023-03-16 NOTE — Telephone Encounter (Signed)
Copied from CRM [id number]. Topic: General - Call Back - No Documentation >> Mar 16, 2023 10:01 AM Macon Large wrote: Reason for CRM: Pt stated that she received a call asking that she return the call. Pt requests call back from someone that speaks Spanish. Cb# [phone]

## 2023-03-17 ENCOUNTER — Ambulatory Visit (INDEPENDENT_AMBULATORY_CARE_PROVIDER_SITE_OTHER): Payer: Medicare Other | Admitting: Obstetrics and Gynecology

## 2023-03-17 ENCOUNTER — Telehealth: Payer: Self-pay | Admitting: Internal Medicine

## 2023-03-17 ENCOUNTER — Ambulatory Visit: Payer: Medicare Other | Admitting: Physician Assistant

## 2023-03-17 ENCOUNTER — Encounter: Payer: Self-pay | Admitting: Obstetrics and Gynecology

## 2023-03-17 VITALS — BP 112/52 | HR 77 | Ht 62.5 in | Wt 200.0 lb

## 2023-03-17 DIAGNOSIS — R58 Hemorrhage, not elsewhere classified: Secondary | ICD-10-CM

## 2023-03-17 DIAGNOSIS — R102 Pelvic and perineal pain: Secondary | ICD-10-CM | POA: Diagnosis not present

## 2023-03-17 DIAGNOSIS — N644 Mastodynia: Secondary | ICD-10-CM

## 2023-03-17 NOTE — Progress Notes (Signed)
GYNECOLOGY  VISIT   HPI: 67 y.o.   Married  Hispanic  female   No obstetric history on file. with No LMP recorded. Patient is postmenopausal.   here for   ACUTE NEW GYN/ PMB- noticed spotting last week. Notices some abdomical cramps and back pain.   An interpretor is present for the entire visit today.  She had a hysterectomy for fibroids.  Her ovaries have been removed.  She underwent more than one surgery, one for left ovarian removal and then a hysterectomy with removal of right ovary and what sounds like a possible left ovarian remnant.  Surgeries were done in [place].  Not taking any hormone therapy.  She first noticed the bleeding with wiping and noticed blood in the toilet, about 6 weeks ago. Since then, she is having recurrent bleeding about every 8 days.   Is sexually active but sex did not precede the bleeding when it first occurred.  No post coital bleeding at all.  No pain with intercourse.  No other partners.   She is experiencing some discomfort in the area of the ovaries and goes into her back in a band like fashion.  Pain is improved with gabapentin, prescribed for her diabetic neuropathy.  The pain is there all the time.  Nothing really makes the pain worse.   No medicines per vagina.   No pain with urination.  She gave [place] a urine specimen today.   No blood in her stool.   Pelvic US done 03/14/23.  Results are pending.   She will return to her PCP at the end of this month.   Moved here from [place]. First husband passed away.  She remarried 10 years ago and states her husband is younger than she is.   GYNECOLOGIC HISTORY: No LMP recorded. Patient is postmenopausal. Contraception:  PMP Menopausal hormone therapy:  n/a Last mammogram:  01/18/22 Breast Density Cat B, BI-RADS CAT 1 neg Last pap smear:   05/18/19 neg: HR HPV neg        OB History   No obstetric history on file.        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 12/28/2021   Mixed stress and urge urinary incontinence 12/28/2021   Class 2 severe obesity with serious comorbidity and body mass index (BMI) of 35.0 to 35.9 in adult [facility]) 12/28/2021   Secondary hyperparathyroidism (HCC) 12/02/2021   Age related osteoporosis 11/30/2021   Osteoporosis of multiple sites 04/21/2021   Leukocytosis 11/18/2020   Thrombocytosis 11/18/2020   Hypertension associated with stage 3 chronic kidney disease due to type 2 diabetes mellitus (HCC) 11/03/2020   Obesity (BMI 35.0-39.9 without comorbidity) 11/03/2020   Stage 3b chronic kidney disease (HCC) 04/06/2019   Hyperlipidemia associated with type 2 diabetes mellitus (HCC) 04/06/2019   Gastroesophageal reflux disease without esophagitis 04/06/2019   Type 2 diabetes, controlled, with peripheral neuropathy (HCC) 01/26/2019   Diabetic retinopathy associated with controlled type 2 diabetes mellitus (HCC) 01/26/2019   Essential hypertension 01/26/2019   Anemia 01/26/2019   Drug-induced constipation 01/26/2019   Memory difficulties 01/26/2019   Positive depression screening 01/26/2019    Past Medical History:  Diagnosis Date   Cataract    states removed in 2014   Chronic kidney disease 02/14/2020   Stage 3    Diabetes mellitus without complication (HCC)    GERD (gastroesophageal reflux disease)  Hypertension     Past Surgical History:  Procedure Laterality Date   ABDOMINAL HYSTERECTOMY     fibroid   CHOLECYSTECTOMY     INCONTINENCE SURGERY     REFRACTIVE SURGERY  2017   BL    Current Outpatient Medications  Medication Sig Dispense Refill   Accu-Chek FastClix Lancets MISC Use to check blood sugar 3 TIMES DAILY. (Patient taking differently: Use to check blood sugar 3 TIMES DAILY.) 100 each 2   amLODipine (NORVASC) 5 MG tablet Take 1.5 tablets (7.5 mg total) by mouth daily. 135 tablet 1   atorvastatin (LIPITOR) 20 MG tablet  Take 1 tablet (20 mg total) by mouth daily. 90 tablet 3   Blood Glucose Monitoring Suppl (ACCU-CHEK GUIDE ME) w/Device KIT Use to check blood sugar 3 TIMES DAILY. (Patient taking differently: Use to check blood sugar 3 TIMES DAILY.) 1 kit 0   clobetasol (TEMOVATE) 0.05 % external solution APPLY SMALL AMOUNT TO AFFECTED AREA 3 TIMES A WEEK AS NEEDED. 50 mL 1   Continuous Blood Gluc Receiver (FREESTYLE LIBRE READER) DEVI use as directed 1 each 0   Continuous Blood Gluc Sensor (FREESTYLE LIBRE SENSOR SYSTEM) MISC Change sensor every 2 weeks (14 days) 2 each 12   dapagliflozin propanediol (FARXIGA) 5 MG TABS tablet Take 1 tablet (5 mg total) by mouth daily before breakfast. 90 tablet 1   dapagliflozin propanediol (FARXIGA) 5 MG TABS tablet Take 1 tablet (5 mg total) by mouth daily. 90 tablet 1   ergocalciferol (VITAMIN D2) 1.25 MG (50000 UT) capsule Take 1 capsule (50,000 Units total) by mouth once a week. 4 capsule 1   fluticasone (FLONASE) 50 MCG/ACT nasal spray Place 1 spray into both nostrils daily. 16 g 0   gabapentin (NEURONTIN) 400 MG capsule TAKE 1 CAPSULE BY MOUTH TWICE  DAILY 180 capsule 3   glucose blood (ACCU-CHEK GUIDE) test strip Use to check blood sugar 3 TIMES DAILY. (Patient taking differently: Use to check blood sugar 3 TIMES DAILY.) 100 each 2   insulin NPH-regular Human (HUMULIN 70/30) (70-30) 100 UNIT/ML injection Inject 25 units in morning with breakfast and 12 units with dinner 10 mL 5   Insulin Pen Needle (TRUEPLUS PEN NEEDLES) 32G X 4 MM MISC Use to inject insulin twice daily. 100 each 2   Insulin Syringe-Needle U-100 (TRUEPLUS INSULIN SYRINGE) 31G X 5/16" 0.3 ML MISC Use to inject insulin twice daily 100 each 2   loratadine (CLARITIN) 10 MG tablet Take 1 tablet (10 mg total) by mouth daily. 30 tablet 1   losartan (COZAAR) 50 MG tablet Take 1 tablet (50 mg total) by mouth daily. 90 tablet 1   methocarbamol (ROBAXIN) 500 MG tablet Take 1 tablet (500 mg total) by mouth every 6  (six) hours as needed. 90 tablet 0   omeprazole (PRILOSEC) 20 MG capsule TAKE 1 CAPSULE (20 MG TOTAL) BY MOUTH DAILY. 30 capsule 3   OVER THE COUNTER MEDICATION Take 1 capsule by mouth at bedtime.     oxybutynin (DITROPAN XL) 5 MG 24 hr tablet Take 1 tablet (5 mg total) by mouth at bedtime. 30 tablet 3   topiramate (TOPAMAX) 25 MG tablet Take 1 tablet (25 mg total) by mouth at bedtime as needed. 90 tablet 1   aspirin EC 81 MG tablet Take 1 tablet (81 mg total) by mouth daily. Swallow whole. (Patient not taking: Reported on 08/06/2022) 100 tablet 1   No current facility-administered medications for this visit.     ALLERGIES:  Patient has no known allergies.  Family History  Problem Relation Age of Onset   Diabetes Mother    Colon cancer Neg Hx    Colon polyps Neg Hx    Esophageal cancer Neg Hx    Rectal cancer Neg Hx    Stomach cancer Neg Hx     Social History   Socioeconomic History   Marital status: Married    Spouse name: Not on file   Number of children: 1   Years of education: 12 grade   Highest education level: Not on file  Occupational History   Occupation: unemployed  Tobacco Use   Smoking status: Never   Smokeless tobacco: Never  Vaping Use   Vaping status: Never Used  Substance and Sexual Activity   Alcohol use: Never   Drug use: Never   Sexual activity: Yes    Birth control/protection: Post-menopausal  Other Topics Concern   Not on file  Social History Narrative   Not on file   Social Determinants of Health   Financial Resource Strain: Medium Risk (08/06/2022)   Overall Financial Resource Strain (CARDIA)    Difficulty of Paying Living Expenses: Somewhat hard  Food Insecurity: Food Insecurity Present (08/06/2022)   Hunger Vital Sign    Worried About Running Out of Food in the Last Year: Sometimes true    Ran Out of Food in the Last Year: Sometimes true  Transportation Needs: Unmet Transportation Needs (08/06/2022)   PRAPARE - Transportation    Lack of  Transportation (Medical): Yes    Lack of Transportation (Non-Medical): Yes  Physical Activity: Inactive (08/06/2022)   Exercise Vital Sign    Days of Exercise per Week: 0 days    Minutes of Exercise per Session: 0 min  Stress: No Stress Concern Present (08/06/2022)   [employer] of Occupational Health - Occupational Stress Questionnaire    Feeling of Stress : Not at all  Social Connections: Socially Isolated (08/06/2022)   Social Connection and Isolation Panel [NHANES]    Frequency of Communication with Friends and Family: Once a week    Frequency of Social Gatherings with Friends and Family: Never    Attends Religious Services: Never    Database administrator or Organizations: No    Attends Banker Meetings: Never    Marital Status: Married  Catering manager Violence: Not on file    Review of Systems  All other systems reviewed and are negative.   PHYSICAL EXAMINATION:    BP (!) 112/52 (BP Location: Left Arm, Patient Position: Sitting, Cuff Size: Normal)   Pulse 77   Ht 5' 2.5" (1.588 m)   Wt 200 lb (90.7 kg)   SpO2 99%   BMI 36.00 kg/m     General appearance: alert, cooperative and appears stated age Head: Normocephalic, without obvious abnormality, atraumatic Neck: no adenopathy, supple, symmetrical, trachea midline and thyroid normal to inspection and palpation Lungs: clear to auscultation bilaterally Heart: regular rate and rhythm Abdomen: soft, tender across bilateral lower abdomen +/- guarding, no rebound, no masses,  no organomegaly No abnormal inguinal nodes palpated  Pelvic: External genitalia:  no lesions              Urethra:  normal appearing urethra with no masses, tenderness or lesions              Bartholins and Skenes: normal                 Vagina: normal appearing vagina with normal  color and discharge, no lesions              Cervix: absent                Bimanual Exam:  Uterus:  absent              Adnexa: no mass, fullness,  tenderness              Rectal exam: yes.  Confirms.              Anus:  normal sphincter tone, no lesions  Chaperone was present for exam:  Warren Lacy, CNA  ASSESSMENT  Bleeding from an unknown source.  I think it is unlikely that the bleeding is from a gynecologic source.  Status post hysterectomy and bilateral oophorectomy for fibroids.  Lower abdominal pain.  No acute abdomen.  Uncertain etiology. Hx stroke.  PLAN  Urinalysis:  sg 1.010, ph 5.5, no blood, 0 - 5WBC, no RBC, 6 - 10 squams, few bacteria. UC will be sent.  Her pelvic US is not final at this time. I recommend she return to her primary care provider for further evaluation of her abdominal pain and bleeding.  She may need further imaging and referral to a gastroenterologist.  FU here prn.    45 min  total time was spent for this patient encounter, including preparation, face-to-face counseling with the patient, coordination of care, and documentation of the encounter.

## 2023-03-17 NOTE — Telephone Encounter (Signed)
Copied from CRM [id number]. Topic: General - Other >> Mar 17, 2023  9:08 AM Dondra Prader E wrote: Needs bilateral diagnostic with right ultrasound in order for her to be scheduled/seen.   Sue Lush from the [facility] [place]  Best contact: [phone] ext. [phone] Fax: [phone]

## 2023-03-18 NOTE — Telephone Encounter (Signed)
Called Chelsea Coleman from the [facility] [place] and LVM to call back.

## 2023-03-19 LAB — URINE CULTURE
MICRO NUMBER:: 15547756
Result:: NO GROWTH
SPECIMEN QUALITY:: ADEQUATE

## 2023-03-19 LAB — URINALYSIS, COMPLETE W/RFL CULTURE
Bilirubin Urine: NEGATIVE
Casts: NONE SEEN /[LPF]
Crystals: NONE SEEN /[HPF]
Hgb urine dipstick: NEGATIVE
Hyaline Cast: NONE SEEN /[LPF]
Ketones, ur: NEGATIVE
Leukocyte Esterase: NEGATIVE
Nitrites, Initial: NEGATIVE
Protein, ur: NEGATIVE
RBC / HPF: NONE SEEN /[HPF] (ref 0–2)
Specific Gravity, Urine: 1.01 (ref 1.001–1.035)
Yeast: NONE SEEN /[HPF]
pH: 5.5 (ref 5.0–8.0)

## 2023-03-19 LAB — CULTURE INDICATED

## 2023-03-19 NOTE — Telephone Encounter (Signed)
Order placed for diagnostic [facility].

## 2023-03-19 NOTE — Addendum Note (Signed)
Addended by: Jonah Blue B on: 03/19/2023 07:25 PM   Modules accepted: Orders

## 2023-03-23 ENCOUNTER — Other Ambulatory Visit: Payer: Self-pay | Admitting: Internal Medicine

## 2023-03-23 DIAGNOSIS — N644 Mastodynia: Secondary | ICD-10-CM

## 2023-04-03 NOTE — Progress Notes (Signed)
Let pt know that her pelvic US came back showing that she has had a hysterectomy so bleeding not coming from uterus.  She saw gynecology recently and had vaginal exam.  No abnormal masses seen in vagina.  Keep appt on 30th of this month.  Will likely need CT scan of abdomen and pelvis and referral to urologist to evaluate if bleeding is coming from bladder/urinary tract.

## 2023-04-04 ENCOUNTER — Encounter: Payer: Self-pay | Admitting: Internal Medicine

## 2023-04-04 ENCOUNTER — Other Ambulatory Visit: Payer: Self-pay | Admitting: Internal Medicine

## 2023-04-04 ENCOUNTER — Other Ambulatory Visit: Payer: Self-pay

## 2023-04-04 ENCOUNTER — Ambulatory Visit: Payer: Medicare Other | Admitting: Internal Medicine

## 2023-04-04 VITALS — BP 122/80 | HR 78 | Ht 62.5 in | Wt 202.0 lb

## 2023-04-04 DIAGNOSIS — Q740 Other congenital malformations of upper limb(s), including shoulder girdle: Secondary | ICD-10-CM | POA: Diagnosis not present

## 2023-04-04 DIAGNOSIS — N2581 Secondary hyperparathyroidism of renal origin: Secondary | ICD-10-CM | POA: Diagnosis not present

## 2023-04-04 DIAGNOSIS — E1159 Type 2 diabetes mellitus with other circulatory complications: Secondary | ICD-10-CM

## 2023-04-04 DIAGNOSIS — Z23 Encounter for immunization: Secondary | ICD-10-CM | POA: Diagnosis not present

## 2023-04-04 DIAGNOSIS — M810 Age-related osteoporosis without current pathological fracture: Secondary | ICD-10-CM | POA: Diagnosis not present

## 2023-04-04 LAB — ALBUMIN: Albumin: 3.7 g/dL (ref 3.5–5.2)

## 2023-04-04 LAB — VITAMIN D 25 HYDROXY (VIT D DEFICIENCY, FRACTURES): VITD: 29.08 ng/mL — ABNORMAL LOW (ref 30.00–100.00)

## 2023-04-04 NOTE — Progress Notes (Unsigned)
Name: Chelsea Coleman  MRN/ DOB: [id number], Nov 10, 1955    Age/ Sex: 67 y.o., female    PCP: Marcine Matar, MD   Reason for Endocrinology Evaluation: Osteoporosis     Date of Initial Endocrinology Evaluation: 03/29/2022    HPI: Chelsea Coleman is a 67 y.o. female with a past medical history of T2DM, HTN, CKD III and dyslipidemia. The patient presented for initial endocrinology clinic visit on 03/29/2022 for consultative assistance with her osteoporosis.    Interpreter line has been used [place])  Pt was diagnosed with osteoporosis: November 2022 with a T score of -5 at the distal radius  Menarche at age : 35 Menopausal at age : She had total hysterectomy at age 61's Fracture Hx: no Hx of HRT: yes , short term  due to hirsutism  FH of osteoporosis or hip fracture: no Prior Hx of anti-estrogenic therapy :no Prior Hx of anti-resorptive therapy : Sheilah Mins was recommended in July 2023 but this was cost prohibitive.  She was started on Prolia 05/04/2022  Of note the patient has had CKD III since at least 2020. She follows with nephrology   24-hour urinary calcium was low at 56 mg / 24 hours Patient has been noted with secondary hyperparathyroidism which is multifactorial due to CKD and low calcium intake     SUBJECTIVE:    Today (04/04/23):  Chelsea Coleman is here for follow-up on osteoporosis. Interpretor line was used.   Interpreter line has been used today  Last Prolia injection 11/09/2022 Next due 05/03/2023  She is on calcium tablets as well as through diet with mild and spinach  She is also on Vitamin D 1000 international unit daily   Denies recent falls  Denies bone fractures  Denies constipation or diarrhea  Denies nausea or vomiting  Denies rash with Prolia  She has noted right clavicular higher than right and right shoulder pain        HISTORY:  Past Medical History:  Past Medical History:  Diagnosis Date    Cataract    states removed in 2014   Chronic kidney disease 02/14/2020   Stage 3    Diabetes mellitus without complication (HCC)    GERD (gastroesophageal reflux disease)    Hypertension    Past Surgical History:  Past Surgical History:  Procedure Laterality Date   ABDOMINAL HYSTERECTOMY     fibroid   CHOLECYSTECTOMY     INCONTINENCE SURGERY     REFRACTIVE SURGERY  2017   BL    Social History:  reports that she has never smoked. She has never used smokeless tobacco. She reports that she does not drink alcohol and does not use drugs. Family History: family history includes Diabetes in her mother.   HOME MEDICATIONS: Allergies as of 04/04/2023   No Known Allergies      Medication List        Accurate as of April 04, 2023  8:36 AM. If you have any questions, ask your nurse or doctor.          Accu-Chek FastClix Lancets Misc Use to check blood sugar 3 TIMES DAILY.   Accu-Chek Guide test strip Generic drug: glucose blood Use to check blood sugar 3 TIMES DAILY.   Accu-Chek Guide w/Device Kit Use to check blood sugar 3 TIMES DAILY.   amLODipine 5 MG tablet Commonly known as: NORVASC Take 1.5 tablets (7.5 mg total) by mouth daily.   aspirin EC 81  MG tablet Take 1 tablet (81 mg total) by mouth daily. Swallow whole.   atorvastatin 20 MG tablet Commonly known as: LIPITOR Take 1 tablet (20 mg total) by mouth daily.   clobetasol 0.05 % external solution Commonly known as: TEMOVATE APPLY SMALL AMOUNT TO AFFECTED AREA 3 TIMES A WEEK AS NEEDED.   Farxiga 5 MG Tabs tablet Generic drug: dapagliflozin propanediol Tome 1 tableta (5 mg en total) por va oral diariamente. (Take 1 tablet (5 mg total) by mouth daily.)   dapagliflozin propanediol 5 MG Tabs tablet Commonly known as: Farxiga Tome 1 tableta (5 mg en total) por va oral diariamente antes de desayunar. (Take 1 tablet (5 mg total) by mouth daily before breakfast.)   fluticasone 50 MCG/ACT nasal  spray Commonly known as: FLONASE Place 1 spray into both nostrils daily.   FreeStyle [place] 2 Reader [employer] se indica. (use as directed)   FreeStyle Libre 2 Sensor Misc Change sensor every 2 weeks (14 days)   gabapentin 400 MG capsule Commonly known as: NEURONTIN TAKE 1 CAPSULE BY MOUTH TWICE  DAILY   HumuLIN 70/30 (70-30) 100 UNIT/ML injection Generic drug: insulin NPH-regular Human Inject 25 units in morning with breakfast and 12 units with dinner   loratadine 10 MG tablet Commonly known as: CLARITIN Tome 1 tableta (10 mg en total) por va oral diariamente. (Take 1 tablet (10 mg total) by mouth daily.)   losartan 50 MG tablet Commonly known as: COZAAR Take 1 tablet (50 mg total) by mouth daily.   methocarbamol 500 MG tablet Commonly known as: ROBAXIN Take 1 tablet (500 mg total) by mouth every 6 (six) hours as needed.   omeprazole 20 MG capsule Commonly known as: PRILOSEC Tome 1 cpsula (20 mg en total) por va oral diariamente. (TAKE 1 CAPSULE (20 MG TOTAL) BY MOUTH DAILY.)   OVER THE COUNTER MEDICATION Take 1 capsule by mouth at bedtime.   oxybutynin 5 MG 24 hr tablet Commonly known as: Ditropan XL Tome 1 tableta (5 mg en total) por va oral antes de acostarse. (Take 1 tablet (5 mg total) by mouth at bedtime.)   topiramate 25 MG tablet Commonly known as: Topamax Take 1 tablet (25 mg total) by mouth at bedtime as needed.   TRUEplus Insulin Syringe 31G X 5/16" 0.3 ML Misc Generic drug: Insulin Syringe-Needle U-100 Use to inject insulin twice daily   TRUEplus Pen Needles 32G X 4 MM Misc Generic drug: Insulin Pen Needle Use to inject insulin twice daily.   Vitamin D (Ergocalciferol) 1.25 MG (50000 UNIT) Caps capsule Commonly known as: DRISDOL Take 1 capsule (50,000 Units total) by mouth once a week.          REVIEW OF SYSTEMS: A comprehensive ROS was conducted with the patient and is negative except as per HPI    OBJECTIVE:  VS: BP 122/80  (BP Location: Left Arm, Patient Position: Sitting, Cuff Size: Large)   Pulse 78   Ht 5' 2.5" (1.588 m)   Wt 202 lb (91.6 kg)   SpO2 98%   BMI 36.36 kg/m    Wt Readings from Last 3 Encounters:  04/04/23 202 lb (91.6 kg)  03/17/23 200 lb (90.7 kg)  02/16/23 201 lb 3.2 oz (91.3 kg)     EXAM: General: Pt appears well and is in NAD  Lungs: Clear with good BS bilat with no rales, rhonchi, or wheezes  Heart: Auscultation: RRR.  Abdomen: Normoactive bowel sounds, soft, nontender, without masses or organomegaly palpable  Extremities:  BL LE: No pretibial edema  Mental Status: Judgment, insight: Intact Orientation: Oriented to time, place, and person Mood and affect: No depression, anxiety, or agitation     DATA REVIEWED:  Latest Reference Range & Units 04/04/23 09:02  Calcium 8.6 - 10.4 mg/dL 9.0  Albumin 3.5 - 5.2 g/dL 3.7  VITD 16.10 - 960.45 ng/mL 29.08 (L)    Latest Reference Range & Units 04/04/23 09:02  PTH, Intact 16 - 77 pg/mL 122 (H)  (H): Data is abnormally high      Latest Reference Range & Units 12/06/22 11:23  Sodium 134 - 144 mmol/L 136  Potassium 3.5 - 5.2 mmol/L 4.6  Chloride 96 - 106 mmol/L 101  CO2 20 - 29 mmol/L 24  Glucose 70 - 99 mg/dL 409 (H)  BUN 8 - 27 mg/dL 15  Creatinine 8.11 - 9.14 mg/dL 7.82 (H)  Calcium 8.7 - 10.3 mg/dL 9.1  BUN/Creatinine Ratio 12 - 28  12  eGFR >59 mL/min/1.73 47 (L)  Alkaline Phosphatase 44 - 121 IU/L 100  Albumin 3.9 - 4.9 g/dL 4.0  AST 0 - 40 IU/L 17  ALT 0 - 32 IU/L 11  Total Protein 6.0 - 8.5 g/dL 7.6  Total Bilirubin 0.0 - 1.2 mg/dL <9.5    Latest Reference Range & Units 02/16/23 09:53  TSH 0.450 - 4.500 uIU/mL 2.350        DXA 04/20/2021  Site Region Measured Date Measured Age YA BMD Significant CHANGE T-score Left Forearm Radius 33% 04/20/2021 66.5 -5.0 0.444 g/cm2   DualFemur Neck Right 04/20/2021 66.5 -2.7 0.660 g/cm2   DualFemur Total Mean 04/20/2021 66.5 -2.3 0.720 g/cm2     ASSESSMENT/PLAN/RECOMMENDATIONS:   Osteoporosis  - Multifactorial given menopause in her 40's, low calcium intake, chronic PPI use, and CKD III - Emphasized again the importance of optimizing calcium ad vitamin D intake for bone health  - We discussed options of calcium rich diet such as green leafy vegetables and low-fat dairy since she is unable to tolerate calcium tablets due to constipation -Limited antiresorptive agents due to CKD III -I have recommended Evenity but unfortunately it is cost prohibitive with a co-pay of $475 -She has been tolerating Prolia -24-hour urinary calcium is low indicating low calcium intake    Medications : Calcium 1200 mg daily   2. Secondary Hyperparathyroidism :  -Elevated PTH is secondary to multiple factors including CKD III and decrease calcium absorption due to the low intake as well as low vitamin D   3. Right Clavicular asymmetry :   -She has been noted with slight tenderness at the right clavicular head, patient endorses mild right shoulder pain -She declined a referral to orthopedics at this time as her symptoms are tolerable  4.  Vitamin D insufficiency:  -Patient to increase vitamin D to 2000 IU daily   F/U in  1 yr  Signed electronically by: Lyndle Herrlich, MD  [facility]  [facility] [address] [place] [place], [place] [address] Phone: [phone] FAX: [phone]   CC: Marcine Matar, MD [address] [place] [address] [place] [address] Phone: [phone] Fax: [phone]   Return to Endocrinology clinic as below: Future Appointments  Date Time Provider Department Center  04/13/2023  9:10 AM Anders Simmonds, PA-C [facility] None  04/25/2023  1:40 PM [facility] 1 [facility]  04/25/2023  1:50 PM [facility] [place] 1 [facility]  05/23/2023  9:30 AM Jonah Blue  B, MD [facility] None  12/19/2023  8:30 AM Marcine Matar, MD [facility] None

## 2023-04-05 ENCOUNTER — Encounter: Payer: Self-pay | Admitting: Internal Medicine

## 2023-04-05 ENCOUNTER — Other Ambulatory Visit: Payer: Self-pay

## 2023-04-05 LAB — PTH, INTACT AND CALCIUM
Calcium: 9 mg/dL (ref 8.6–10.4)
PTH: 122 pg/mL — ABNORMAL HIGH (ref 16–77)

## 2023-04-05 MED ORDER — AMLODIPINE BESYLATE 5 MG PO TABS
7.5000 mg | ORAL_TABLET | Freq: Every day | ORAL | 1 refills | Status: DC
  Filled 2023-04-05: qty 135, 90d supply, fill #0

## 2023-04-05 NOTE — Telephone Encounter (Signed)
Requested Prescriptions  Pending Prescriptions Disp Refills   amLODipine (NORVASC) 5 MG tablet 135 tablet 1    Sig: Take 1.5 tablets (7.5 mg total) by mouth daily.     Cardiovascular: Calcium Channel Blockers 2 Passed - 04/04/2023  9:13 AM      Passed - Last BP in normal range    BP Readings from Last 1 Encounters:  04/04/23 122/80         Passed - Last Heart Rate in normal range    Pulse Readings from Last 1 Encounters:  04/04/23 78         Passed - Valid encounter within last 6 months    Recent Outpatient Visits           1 month ago Type 2 diabetes mellitus with hyperglycemia, unspecified whether long term insulin use [facility])   [facility] [place], [place], [place]   4 months ago Type 2 diabetes mellitus with morbid obesity [facility])   [place] [facility] Jonah Blue B, MD   8 months ago Type 2 diabetes mellitus with morbid obesity [facility])   [place] [facility] Jonah Blue B, MD   1 year ago Type 2 diabetes mellitus with peripheral neuropathy [facility])   [facility] Jonah Blue B, MD   1 year ago Type 2 diabetes mellitus with peripheral neuropathy [facility])   [facility] Marcine Matar, MD       Future Appointments             In 1 week Fonda, Marzella Schlein, PA-C [facility]   In 1 month Laural Benes, Binnie Rail, MD [facility]   In 8 months Laural Benes, Binnie Rail, MD [facility]

## 2023-04-06 ENCOUNTER — Other Ambulatory Visit: Payer: Self-pay | Admitting: Internal Medicine

## 2023-04-06 ENCOUNTER — Other Ambulatory Visit: Payer: Self-pay

## 2023-04-06 DIAGNOSIS — R310 Gross hematuria: Secondary | ICD-10-CM

## 2023-04-08 ENCOUNTER — Other Ambulatory Visit: Payer: Self-pay

## 2023-04-11 ENCOUNTER — Other Ambulatory Visit: Payer: Self-pay

## 2023-04-12 NOTE — Telephone Encounter (Signed)
Prolia VOB initiated via [email or web address]  Next Prolia inj DUE: 05/13/23

## 2023-04-13 ENCOUNTER — Other Ambulatory Visit: Payer: Self-pay

## 2023-04-13 ENCOUNTER — Ambulatory Visit: Payer: Medicare Other | Attending: Physician Assistant | Admitting: Physician Assistant

## 2023-04-13 VITALS — BP 140/80 | HR 75 | Temp 98.2°F | Ht 62.0 in | Wt 206.6 lb

## 2023-04-13 DIAGNOSIS — G43109 Migraine with aura, not intractable, without status migrainosus: Secondary | ICD-10-CM | POA: Diagnosis not present

## 2023-04-13 DIAGNOSIS — E1142 Type 2 diabetes mellitus with diabetic polyneuropathy: Secondary | ICD-10-CM

## 2023-04-13 DIAGNOSIS — I152 Hypertension secondary to endocrine disorders: Secondary | ICD-10-CM | POA: Diagnosis not present

## 2023-04-13 DIAGNOSIS — Z794 Long term (current) use of insulin: Secondary | ICD-10-CM | POA: Diagnosis not present

## 2023-04-13 DIAGNOSIS — E785 Hyperlipidemia, unspecified: Secondary | ICD-10-CM | POA: Diagnosis not present

## 2023-04-13 DIAGNOSIS — E1159 Type 2 diabetes mellitus with other circulatory complications: Secondary | ICD-10-CM

## 2023-04-13 DIAGNOSIS — E1165 Type 2 diabetes mellitus with hyperglycemia: Secondary | ICD-10-CM | POA: Diagnosis not present

## 2023-04-13 DIAGNOSIS — E1169 Type 2 diabetes mellitus with other specified complication: Secondary | ICD-10-CM

## 2023-04-13 DIAGNOSIS — R58 Hemorrhage, not elsewhere classified: Secondary | ICD-10-CM

## 2023-04-13 DIAGNOSIS — Z7984 Long term (current) use of oral hypoglycemic drugs: Secondary | ICD-10-CM | POA: Diagnosis not present

## 2023-04-13 LAB — POCT GLYCOSYLATED HEMOGLOBIN (HGB A1C): Hemoglobin A1C: 7.1 % — AB (ref 4.0–5.6)

## 2023-04-13 LAB — GLUCOSE, POCT (MANUAL RESULT ENTRY): POC Glucose: 141 mg/dL — AB (ref 70–99)

## 2023-04-13 MED ORDER — AMLODIPINE BESYLATE 5 MG PO TABS
7.5000 mg | ORAL_TABLET | Freq: Every day | ORAL | 1 refills | Status: DC
  Filled 2023-04-13 – 2023-07-21 (×2): qty 135, 90d supply, fill #0
  Filled 2023-10-21: qty 135, 90d supply, fill #1

## 2023-04-13 MED ORDER — INSULIN SYRINGE-NEEDLE U-100 31G X 5/16" 0.3 ML MISC
2 refills | Status: AC
  Filled 2023-04-13: qty 100, fill #0
  Filled 2023-10-21: qty 100, 50d supply, fill #0

## 2023-04-13 MED ORDER — HUMULIN 70/30 (70-30) 100 UNIT/ML ~~LOC~~ SUSP
SUBCUTANEOUS | 5 refills | Status: DC
  Filled 2023-04-13: qty 10, fill #0
  Filled 2023-05-23: qty 10, 29d supply, fill #0
  Filled 2023-07-21 – 2023-07-25 (×2): qty 10, 29d supply, fill #1
  Filled 2023-08-29 – 2023-08-30 (×2): qty 10, 29d supply, fill #2

## 2023-04-13 MED ORDER — TOPIRAMATE 25 MG PO TABS
25.0000 mg | ORAL_TABLET | Freq: Every evening | ORAL | 1 refills | Status: AC | PRN
  Filled 2023-04-13: qty 90, 90d supply, fill #0

## 2023-04-13 MED ORDER — OXYBUTYNIN CHLORIDE ER 5 MG PO TB24
5.0000 mg | ORAL_TABLET | Freq: Every day | ORAL | 3 refills | Status: DC
  Filled 2023-04-13: qty 30, 30d supply, fill #0

## 2023-04-13 MED ORDER — GABAPENTIN 400 MG PO CAPS
400.0000 mg | ORAL_CAPSULE | Freq: Two times a day (BID) | ORAL | 3 refills | Status: DC
  Filled 2023-04-13: qty 180, 90d supply, fill #0

## 2023-04-13 MED ORDER — TRUEPLUS PEN NEEDLES 32G X 4 MM MISC
2 refills | Status: DC
  Filled 2023-04-13 – 2023-07-21 (×2): qty 100, 50d supply, fill #0

## 2023-04-13 MED ORDER — DAPAGLIFLOZIN PROPANEDIOL 5 MG PO TABS
5.0000 mg | ORAL_TABLET | Freq: Every day | ORAL | 1 refills | Status: DC
  Filled 2023-04-13: qty 90, 90d supply, fill #0

## 2023-04-13 MED ORDER — LOSARTAN POTASSIUM 50 MG PO TABS
50.0000 mg | ORAL_TABLET | Freq: Every day | ORAL | 1 refills | Status: DC
  Filled 2023-04-13 – 2023-07-21 (×2): qty 90, 90d supply, fill #0
  Filled 2023-10-21: qty 90, 90d supply, fill #1

## 2023-04-13 MED ORDER — ATORVASTATIN CALCIUM 20 MG PO TABS
20.0000 mg | ORAL_TABLET | Freq: Every day | ORAL | 3 refills | Status: DC
  Filled 2023-04-13 – 2023-10-21 (×2): qty 90, 90d supply, fill #0

## 2023-04-13 NOTE — Progress Notes (Signed)
Patient ID: Chelsea Coleman, female   DOB: 1956-03-02, 67 y.o.   MRN: [id number]   Chelsea Coleman, is a 67 y.o. female  [id number]  [id number]  DOB - [birth year]  Chief Complaint  Patient presents with   Diabetes       Subjective:   Chelsea Coleman is a 67 y.o. female here today for med RF and diabetes check.  Compliant with meds.  She was seen by endocrinology for some lab abnormalities.  I had referred her to gyn for what she thought was vaginal bleeding, but she has no uterus and the gyn did not think it is coming from the vaginal region.  She is uncertain if it is GI or urological source.  Gyn suggested we follow up with CT abdomen and pelvis.  The blood is episodic.  No abdominal pain.  No changes in BM.  See previous note and gyn work up  She has been taking 20u in am and 10u pm 70/30 but is prescribed 25u am and 12u pm.  Not following diabetic diet  No problems updated.  ALLERGIES: No Known Allergies  PAST MEDICAL HISTORY: Past Medical History:  Diagnosis Date   Cataract    states removed in 2014   Chronic kidney disease 02/14/2020   Stage 3    Diabetes mellitus without complication (HCC)    GERD (gastroesophageal reflux disease)    Hypertension     MEDICATIONS AT HOME: Prior to Admission medications   Medication Sig Start Date End Date Taking? Authorizing Provider  Accu-Chek FastClix Lancets MISC Use to check blood sugar 3 TIMES DAILY. Patient taking differently: Use to check blood sugar 3 TIMES DAILY. 11/03/20   Marcine Matar, MD  amLODipine (NORVASC) 5 MG tablet Take 1.5 tablets (7.5 mg total) by mouth daily. 04/13/23   Anders Simmonds, PA-C  aspirin EC 81 MG tablet Take 1 tablet (81 mg total) by mouth daily. Swallow whole. Patient not taking: Reported on 08/06/2022 12/28/21   Marcine Matar, MD  atorvastatin (LIPITOR) 20 MG tablet Take 1 tablet (20 mg total) by mouth daily. 04/13/23   Anders Simmonds, PA-C  Blood Glucose Monitoring  Suppl (ACCU-CHEK GUIDE ME) w/Device KIT Use to check blood sugar 3 TIMES DAILY. Patient taking differently: Use to check blood sugar 3 TIMES DAILY. 11/03/20   Marcine Matar, MD  clobetasol (TEMOVATE) 0.05 % external solution APPLY SMALL AMOUNT TO AFFECTED AREA 3 TIMES A WEEK AS NEEDED. 03/30/21   Marcine Matar, MD  Continuous Blood Gluc Receiver (FREESTYLE LIBRE READER) DEVI use as directed 12/28/21   Marcine Matar, MD  Continuous Blood Gluc Sensor (FREESTYLE LIBRE SENSOR SYSTEM) MISC Change sensor every 2 weeks (14 days) 12/28/21   Marcine Matar, MD  dapagliflozin propanediol (FARXIGA) 5 MG TABS tablet Take 1 tablet (5 mg total) by mouth daily before breakfast. 10/05/22   Marcine Matar, MD  dapagliflozin propanediol (FARXIGA) 5 MG TABS tablet Take 1 tablet (5 mg total) by mouth daily. 04/13/23   Anders Simmonds, PA-C  ergocalciferol (VITAMIN D2) 1.25 MG (50000 UT) capsule Take 1 capsule (50,000 Units total) by mouth once a week. 11/18/22   Estanislado Emms, MD  fluticasone (FLONASE) 50 MCG/ACT nasal spray Place 1 spray into both nostrils daily. 08/02/22   Marcine Matar, MD  gabapentin (NEURONTIN) 400 MG capsule Take 1 capsule (400 mg total) by mouth 2 (two) times daily. 04/13/23   Anders Simmonds, PA-C  glucose  blood (ACCU-CHEK GUIDE) test strip Use to check blood sugar 3 TIMES DAILY. Patient taking differently: Use to check blood sugar 3 TIMES DAILY. 11/03/20   Marcine Matar, MD  insulin NPH-regular Human (HUMULIN 70/30) (70-30) 100 UNIT/ML injection Inject 23units in morning with breakfast and 12 units with dinner 04/13/23   Georgian Co M, PA-C  Insulin Pen Needle (TRUEPLUS PEN NEEDLES) 32G X 4 MM MISC Use to inject insulin twice daily. 04/13/23   Anders Simmonds, PA-C  Insulin Syringe-Needle U-100 (TRUEPLUS INSULIN SYRINGE) 31G X 5/16" 0.3 ML MISC Use to inject insulin twice daily 04/13/23   Georgian Co M, PA-C  loratadine (CLARITIN) 10 MG tablet Take 1  tablet (10 mg total) by mouth daily. 08/02/22   Marcine Matar, MD  losartan (COZAAR) 50 MG tablet Take 1 tablet (50 mg total) by mouth daily. 04/13/23   Anders Simmonds, PA-C  methocarbamol (ROBAXIN) 500 MG tablet Take 1 tablet (500 mg total) by mouth every 6 (six) hours as needed. 02/16/23   Anders Simmonds, PA-C  omeprazole (PRILOSEC) 20 MG capsule TAKE 1 CAPSULE (20 MG TOTAL) BY MOUTH DAILY. 07/06/22   Marcine Matar, MD  OVER THE COUNTER MEDICATION Take 1 capsule by mouth at bedtime.    [provider]  oxybutynin (DITROPAN XL) 5 MG 24 hr tablet Take 1 tablet (5 mg total) by mouth at bedtime. 04/13/23   Anders Simmonds, PA-C  topiramate (TOPAMAX) 25 MG tablet Take 1 tablet (25 mg total) by mouth at bedtime as needed. 04/13/23   Raylie Maddison, Marzella Schlein, PA-C    ROS: Neg HEENT Neg resp Neg cardiac Neg GI Neg GU Neg MS Neg psych Neg neuro  Objective:   Vitals:   04/13/23 0911  BP: (!) 140/80  Pulse: 75  Temp: 98.2 F (36.8 C)  SpO2: 99%  Weight: 206 lb 9.6 oz (93.7 kg)  Height: 5\' 2"  (1.575 m)   Exam General appearance : Awake, alert, not in any distress. Speech Clear. Not toxic looking HEENT: Atraumatic and Normocephalic Neck: Supple, no JVD. No cervical lymphadenopathy.  Chest: Good air entry bilaterally, CTAB.  No rales/rhonchi/wheezing CVS: S1 S2 regular, no murmurs.  Extremities: B/L Lower Ext shows no edema, both legs are warm to touch Neurology: Awake alert, and oriented X 3, CN II-XII intact, Non focal Skin: No Rash  Data Review Lab Results  Component Value Date   HGBA1C 7.1 (A) 04/13/2023   HGBA1C 7.4 (A) 12/06/2022   HGBA1C 7.4 (A) 08/02/2022    Assessment & Plan   1. Blood on toilet paper - CT ABDOMEN PELVIS W WO CONTRAST; Future - Ambulatory referral to Urology  2. Type 2 diabetes mellitus with hyperglycemia, with long-term current use of insulin (HCC) A1c=7.5 today - Glucose (CBG) - HgB A1c - insulin NPH-regular Human (HUMULIN  70/30) (70-30) 100 UNIT/ML injection; Inject 23units in morning with breakfast and 12 units with dinner  Dispense: 10 mL; Refill: 5 - Basic Metabolic Panel  3. Long term current use of oral hypoglycemic drug - Glucose (CBG) - HgB A1c  4. Long term (current) use of insulin (HCC) - Glucose (CBG) - HgB A1c - insulin NPH-regular Human (HUMULIN 70/30) (70-30) 100 UNIT/ML injection; Inject 23units in morning with breakfast and 12 units with dinner  Dispense: 10 mL; Refill: 5  5. Type 2 diabetes mellitus with morbid obesity (HCC) - insulin NPH-regular Human (HUMULIN 70/30) (70-30) 100 UNIT/ML injection; Inject 23units in morning with breakfast and 12 units  with dinner  Dispense: 10 mL; Refill: 5  6. Type 2 diabetes mellitus with peripheral neuropathy (HCC) - gabapentin (NEURONTIN) 400 MG capsule; Take 1 capsule (400 mg total) by mouth 2 (two) times daily.  Dispense: 180 capsule; Refill: 3 - losartan (COZAAR) 50 MG tablet; Take 1 tablet (50 mg total) by mouth daily.  Dispense: 90 tablet; Refill: 1  7. Hyperlipidemia associated with type 2 diabetes mellitus (HCC) - atorvastatin (LIPITOR) 20 MG tablet; Take 1 tablet (20 mg total) by mouth daily.  Dispense: 90 tablet; Refill: 3  8. Hypertension associated with diabetes (HCC) - amLODipine (NORVASC) 5 MG tablet; Take 1.5 tablets (7.5 mg total) by mouth daily.  Dispense: 135 tablet; Refill: 1  9. Migraine with aura and without status migrainosus, not intractable  - topiramate (TOPAMAX) 25 MG tablet; Take 1 tablet (25 mg total) by mouth at bedtime as needed.  Dispense: 90 tablet; Refill: 1  AMN "Renea Ee" interpreters used and additional time performing visit was required.   Return in about 3 months (around 07/14/2023) for PCP for chronic conditions([place]).  The patient was given clear instructions to go to ER or return to medical center if symptoms don't improve, worsen or new problems develop. The patient verbalized understanding. The patient was  told to call to get lab results if they haven't heard anything in the next week.      Georgian Co, PA-C [facility] and [facility] [place], [place] [phone]   04/13/2023, 10:03 AM

## 2023-04-14 LAB — BASIC METABOLIC PANEL
BUN/Creatinine Ratio: 15 (ref 12–28)
BUN: 19 mg/dL (ref 8–27)
CO2: 23 mmol/L (ref 20–29)
Calcium: 9 mg/dL (ref 8.7–10.3)
Chloride: 100 mmol/L (ref 96–106)
Creatinine, Ser: 1.26 mg/dL — ABNORMAL HIGH (ref 0.57–1.00)
Glucose: 133 mg/dL — ABNORMAL HIGH (ref 70–99)
Potassium: 5.3 mmol/L — ABNORMAL HIGH (ref 3.5–5.2)
Sodium: 136 mmol/L (ref 134–144)
eGFR: 47 mL/min/{1.73_m2} — ABNORMAL LOW (ref >59)

## 2023-04-15 ENCOUNTER — Telehealth: Payer: Self-pay

## 2023-04-15 NOTE — Telephone Encounter (Signed)
-----   Message from Georgian Co sent at 04/14/2023  8:33 AM EDT ----- Please call patient.  Your kidneys appear a little dehydrated and your potassium is slightly elevated.  Please increase your water intake to at least 64 ounces daily to help with both.  Thanks, Georgian Co, PA-C

## 2023-04-15 NOTE — Telephone Encounter (Signed)
Pt was called and vm was left, Information has been sent to nurse pool.   Interpreter ID # [id number] [phone]

## 2023-04-18 ENCOUNTER — Other Ambulatory Visit: Payer: Self-pay

## 2023-04-19 IMAGING — MR MR HEAD WO/W CM
7 of 13 series · 22 of 48 positions shown · IV contrast (gadavist)
Comparison: No pertinent prior exams available for comparison.

CLINICAL DATA: Provided history: Migraine with Rain and without
status migrainosus, not intractable. Headache, new or worsening.

EXAM:
MRI HEAD WITHOUT AND WITH CONTRAST
TECHNIQUE: Multiplanar, multiecho pulse sequences of the brain and surrounding
structures were obtained without and with intravenous contrast.
CONTRAST:  9.5mL GADAVIST GADOBUTROL 1 MMOL/ML IV SOLN

[Series 2: DWI · axial · 3.0mm · 0.94mm/px · z∈[-98,+58]mm · 6 of 105 slices shown (1 of 2)]
[im 1/105]
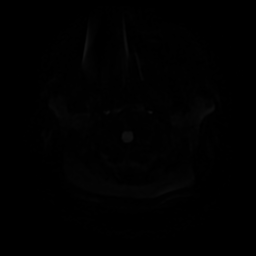
[im 21/105]
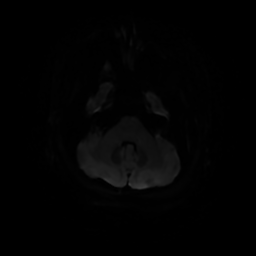
[im 42/105]
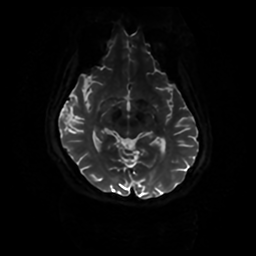
[im 63/105]
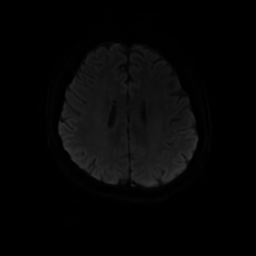
[im 84/105]
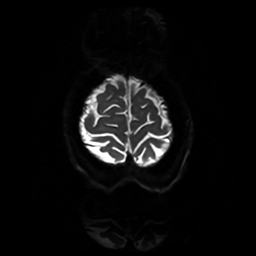
[im 105/105]
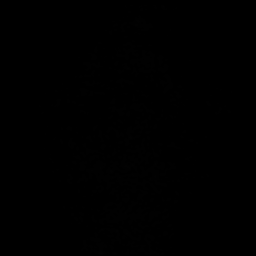

[Series 3: FLAIR · sagittal · 5.0mm · 0.23mm/px · 1 of 25 slices shown (1 of 2)]
[im 1/25]
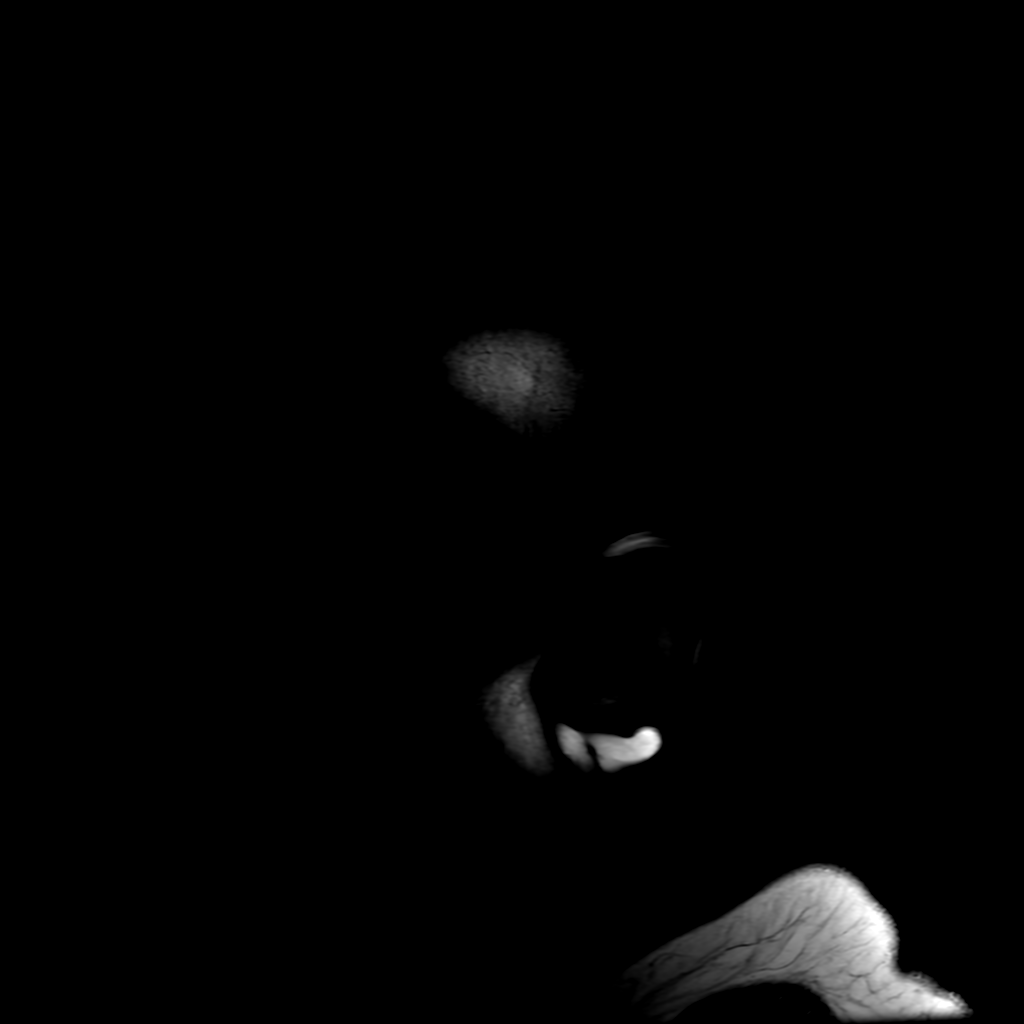

[Series 4: DWI · coronal · 4.0mm · 0.94mm/px · 4 of 70 slices shown (2 of 2)]
[im 1/70]
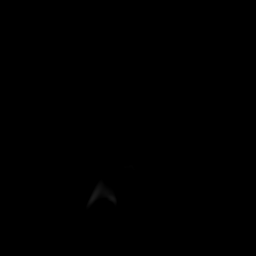
[im 24/70]
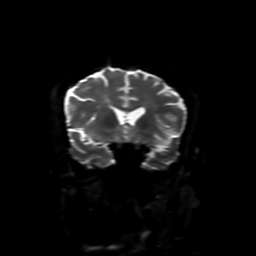
[im 47/70]
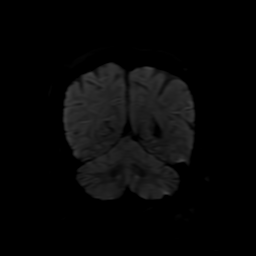
[im 70/70]
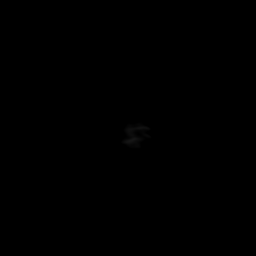

[Series 5: T2 · axial · 5.0mm · 0.23mm/px · z∈[-91,+58]mm · 2 of 26 slices shown]
[im 1/26]
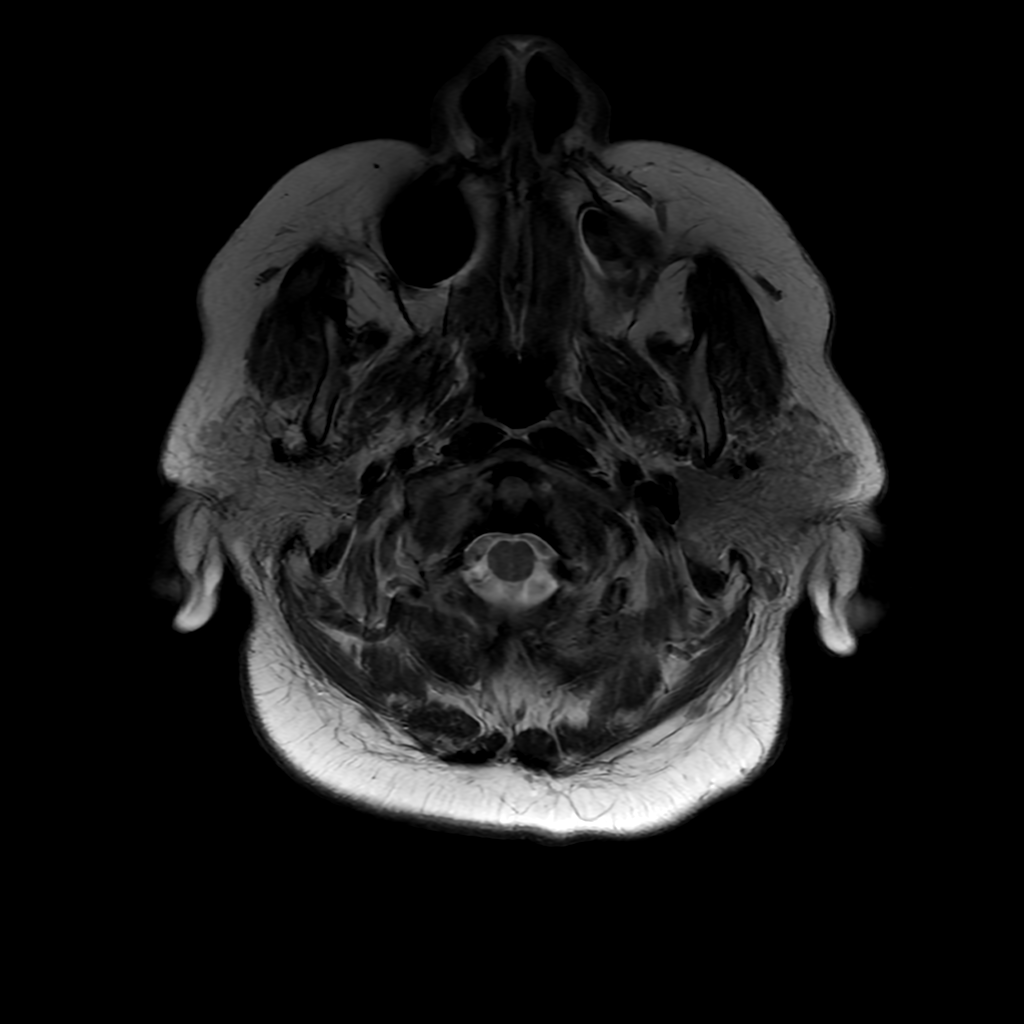
[im 26/26]
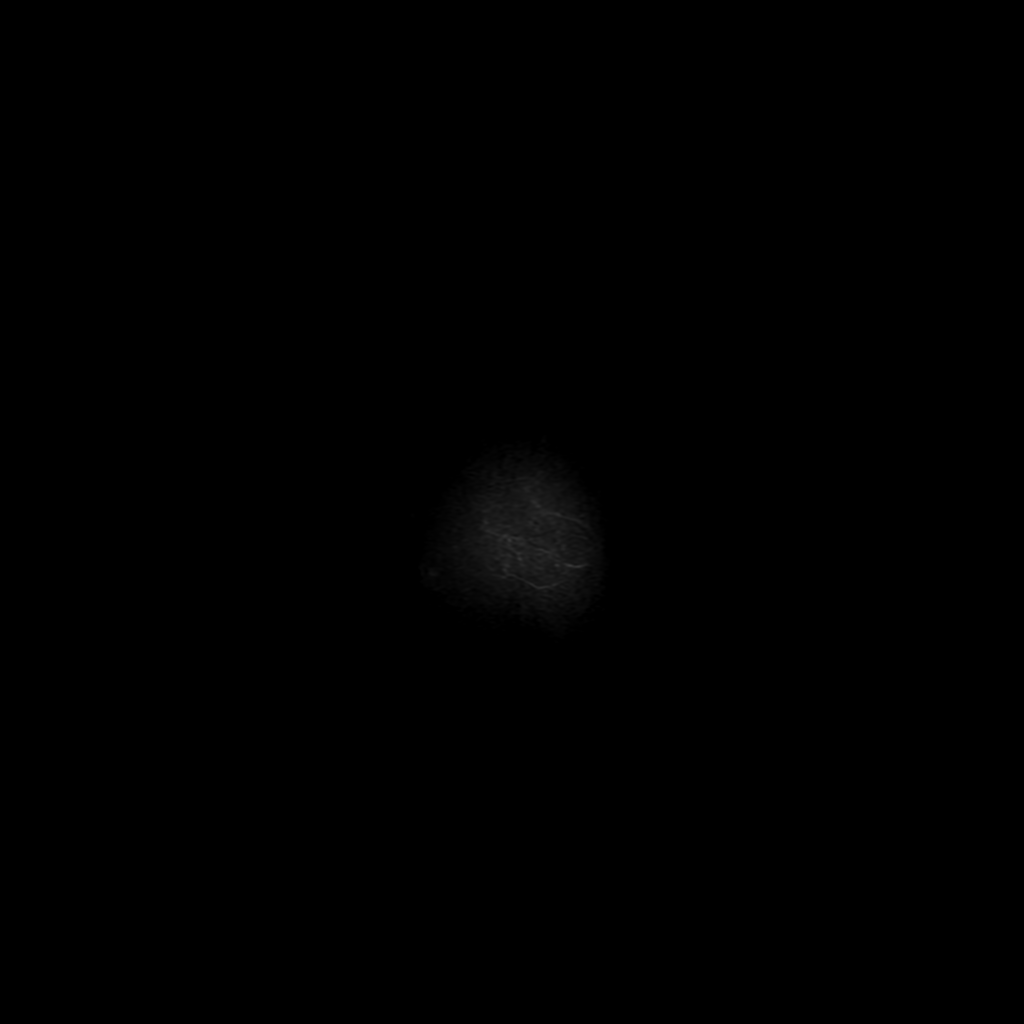

[Series 6: FLAIR · axial · 4.0mm · 0.45mm/px · z∈[-98,+60]mm · 3 of 37 slices shown (2 of 2)]
[im 1/37]
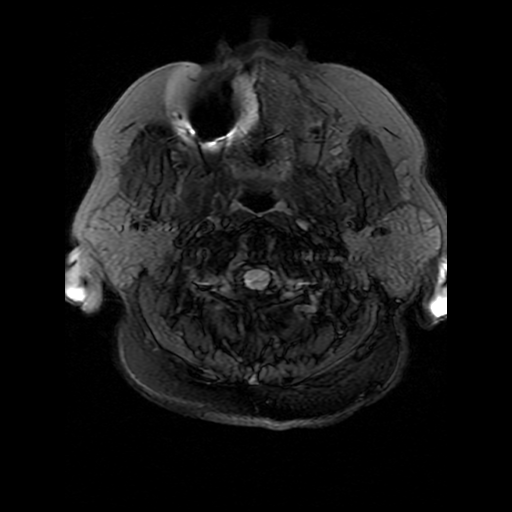
[im 19/37]
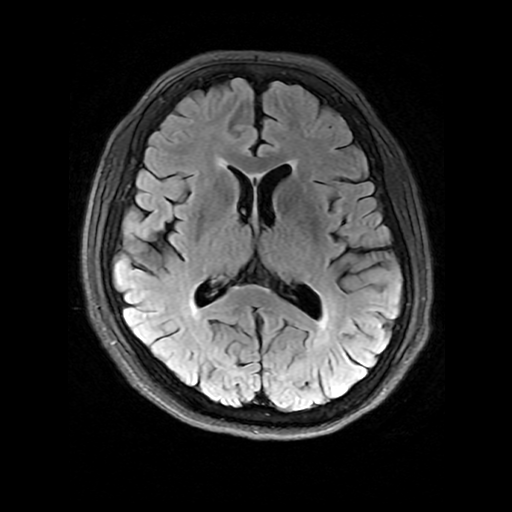
[im 37/37]
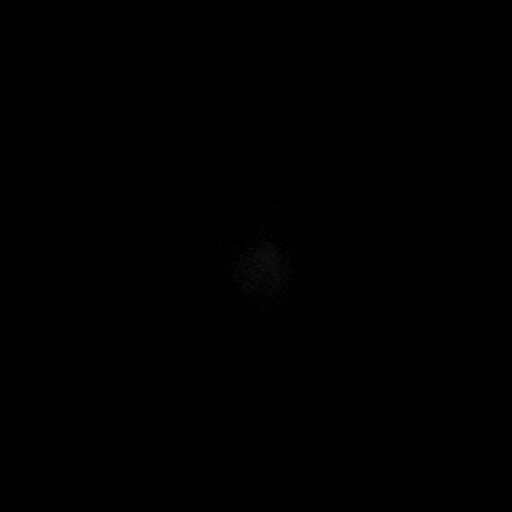

[Series 250: ADC · axial · 3.0mm · 0.94mm/px · z∈[-98,+58]mm · 4 of 53 slices shown (1 of 2)]
[im 1/53]
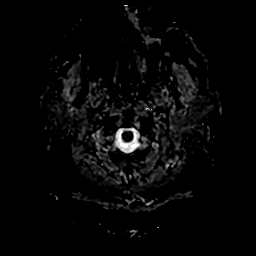
[im 18/53]
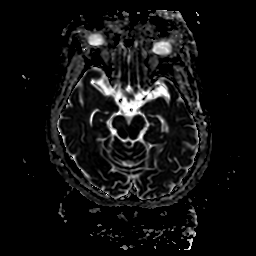
[im 35/53]
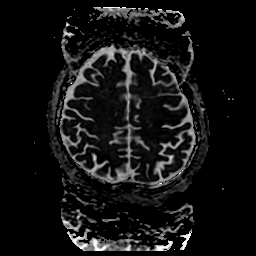
[im 53/53]
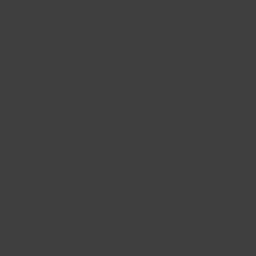

[Series 450: ADC · coronal · 4.0mm · 0.94mm/px · 2 of 34 slices shown (2 of 2)]
[im 1/34]
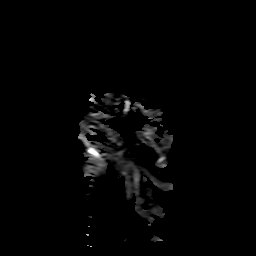
[im 34/34]
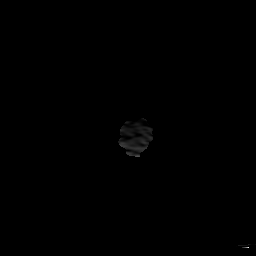

[22 of 48 positions shown; findings below may reference images not displayed]

FINDINGS: Brain:

No age advanced or lobar predominant cerebral atrophy.

Mild multifocal T2 FLAIR hyperintense signal abnormality within the
cerebral white matter, nonspecific but compatible with chronic small
vessel ischemic disease.

Punctate focus of susceptibility weighted signal loss within the
left parietal lobe, which may reflect a chronic microhemorrhage or
focus of mineralization.

Partially empty sella turcica.

Subcentimeter chronic infarcts within the bilateral cerebellar
hemispheres.

There is no acute infarct.

No evidence of an intracranial mass.

No extra-axial fluid collection.

No midline shift.

No pathologic intracranial enhancement identified.

Vascular: Maintained flow voids within the proximal large arterial
vessels.

Skull and upper cervical spine: No focal suspicious marrow lesion.

Sinuses/Orbits: Visualized orbits show no acute finding. Bilateral
ocular lens replacements. Susceptibility artifact partially obscures
the right maxillary sinus. Mild mucosal thickening within the left
ethmoid and bilateral maxillary sinuses.

Other: Left mastoid effusion.
IMPRESSION: No evidence of acute intracranial abnormality.

Mild chronic small vessel ischemic changes within the cerebral white
matter.

Subcentimeter chronic infarcts within the bilateral cerebellar
hemispheres.

Mild mucosal thickening within the left ethmoid and bilateral
maxillary sinuses.

Left mastoid effusion.

## 2023-04-25 ENCOUNTER — Ambulatory Visit
Admission: RE | Admit: 2023-04-25 | Discharge: 2023-04-25 | Disposition: A | Discharge status: Home / Self Care | Payer: Medicare Other | Source: Ambulatory Visit | Attending: Internal Medicine | Admitting: Internal Medicine

## 2023-04-25 ENCOUNTER — Ambulatory Visit: Payer: Medicare Other

## 2023-04-25 ENCOUNTER — Other Ambulatory Visit: Payer: Self-pay

## 2023-04-25 DIAGNOSIS — N644 Mastodynia: Secondary | ICD-10-CM

## 2023-05-02 ENCOUNTER — Ambulatory Visit
Admission: RE | Admit: 2023-05-02 | Discharge: 2023-05-02 | Disposition: A | Discharge status: Home / Self Care | Payer: Medicare Other | Source: Ambulatory Visit | Attending: Physician Assistant | Admitting: Physician Assistant

## 2023-05-02 DIAGNOSIS — R58 Hemorrhage, not elsewhere classified: Secondary | ICD-10-CM

## 2023-05-02 DIAGNOSIS — K922 Gastrointestinal hemorrhage, unspecified: Secondary | ICD-10-CM | POA: Diagnosis not present

## 2023-05-02 MED ORDER — IOPAMIDOL (ISOVUE-370) INJECTION 76%
80.0000 mL | Freq: Once | INTRAVENOUS | Status: AC | PRN
  Administered 2023-05-02: 80 mL via INTRAVENOUS

## 2023-05-04 NOTE — Telephone Encounter (Signed)
Prior Auth; APPROVED PA# [id number] Valid:04/22/22-04/23/23

## 2023-05-07 NOTE — Telephone Encounter (Signed)
Prior Auth for [employer] APPROVED via [email or web address] PA# [id number] Valid: 05/07/23-05/06/24

## 2023-05-09 ENCOUNTER — Telehealth: Payer: Self-pay

## 2023-05-09 NOTE — Telephone Encounter (Signed)
Pt ready for scheduling on or after 05/13/23  Out-of-pocket cost due at time of visit: $346  Primary: U[employer] AARP Medicare Adv HMO-POS Prolia co-insurance: 20% (approximately $320.99) Admin fee co-insurance: 20% (approximately $25)  Deductible: does not apply  Prior Auth APPROVED PA# [id number] Valid: 05/07/23-05/06/24  Secondary: N/A Prolia co-insurance:  Admin fee co-insurance:  Deductible:  Prior Auth:  PA# Valid:   ** This summary of benefits is an estimation of the patient's out-of-pocket cost. Exact cost may vary based on individual plan coverage.

## 2023-05-09 NOTE — Telephone Encounter (Signed)
-----   Message from Georgian Co sent at 05/09/2023 10:28 AM EST ----- Please call patient.  The CT of her abdomen did not show any internal bleeding.  Her last mammogram was normal.  Follow up with Dr Laural Benes as scheduled 05/23/2023.  Thanks, Georgian Co, PA-C

## 2023-05-09 NOTE — Telephone Encounter (Signed)
Pt was called and vm was left, Information has been sent to nurse pool.    Interpreter id # [phone]

## 2023-05-23 ENCOUNTER — Other Ambulatory Visit: Payer: Self-pay

## 2023-05-23 ENCOUNTER — Encounter: Payer: Self-pay | Admitting: Internal Medicine

## 2023-05-23 ENCOUNTER — Ambulatory Visit: Payer: Medicare Other | Attending: Internal Medicine | Admitting: Internal Medicine

## 2023-05-23 VITALS — BP 128/74 | HR 86 | Temp 98.1°F | Ht 62.0 in | Wt 203.0 lb

## 2023-05-23 DIAGNOSIS — E11319 Type 2 diabetes mellitus with unspecified diabetic retinopathy without macular edema: Secondary | ICD-10-CM | POA: Diagnosis not present

## 2023-05-23 DIAGNOSIS — I152 Hypertension secondary to endocrine disorders: Secondary | ICD-10-CM

## 2023-05-23 DIAGNOSIS — R413 Other amnesia: Secondary | ICD-10-CM

## 2023-05-23 DIAGNOSIS — Z794 Long term (current) use of insulin: Secondary | ICD-10-CM

## 2023-05-23 DIAGNOSIS — M810 Age-related osteoporosis without current pathological fracture: Secondary | ICD-10-CM | POA: Diagnosis not present

## 2023-05-23 DIAGNOSIS — E1159 Type 2 diabetes mellitus with other circulatory complications: Secondary | ICD-10-CM

## 2023-05-23 DIAGNOSIS — G8929 Other chronic pain: Secondary | ICD-10-CM

## 2023-05-23 DIAGNOSIS — J302 Other seasonal allergic rhinitis: Secondary | ICD-10-CM

## 2023-05-23 DIAGNOSIS — E559 Vitamin D deficiency, unspecified: Secondary | ICD-10-CM

## 2023-05-23 DIAGNOSIS — M545 Low back pain, unspecified: Secondary | ICD-10-CM

## 2023-05-23 DIAGNOSIS — R319 Hematuria, unspecified: Secondary | ICD-10-CM

## 2023-05-23 DIAGNOSIS — N2581 Secondary hyperparathyroidism of renal origin: Secondary | ICD-10-CM | POA: Diagnosis not present

## 2023-05-23 MED ORDER — LORATADINE 10 MG PO TABS
10.0000 mg | ORAL_TABLET | Freq: Every day | ORAL | 1 refills | Status: DC | PRN
  Filled 2023-05-23: qty 30, 30d supply, fill #0
  Filled 2023-07-21: qty 30, 30d supply, fill #1

## 2023-05-23 MED ORDER — METHOCARBAMOL 500 MG PO TABS
500.0000 mg | ORAL_TABLET | Freq: Every day | ORAL | 1 refills | Status: DC | PRN
  Filled 2023-05-23: qty 30, 30d supply, fill #0
  Filled 2023-07-21: qty 30, 30d supply, fill #1

## 2023-05-23 NOTE — Progress Notes (Signed)
Patient ID: Chelsea Coleman, female    DOB: 06-04-1956  MRN: [id number]  CC: Diabetes (DM f/u.  Med refill. /Intermittent back pain since fall from hammock - 11 yrs ago/Instructed to send imm rec for shingles )   Subjective: Chelsea Coleman is a 67 y.o. female who presents for chronic ds management. Her concerns today include:  Pt with hx of DM neuropathy and retinopathy, HTN, HL, obesity, anemia of chronic ds, thrombocytosis/leukocytosis (followed by hematology), CKD 3b, ACD, Vit D, osteoporosis (Dr. Brooks Sailors), secondary hyperparathyroidism, migraines, chronic BL infarct cerebellar hemisphere, mix urinary incont.    [employer] Language interpreter used during this encounter. #Cristal [id number]  C/o allergies - nasal congestion, dryness in nose/throat, coughing and lacrimation.  Always occur at beginning of pollen season and in winter.  Should be on Flonase, reports using once a day.   Was seen by GYN for ?PMB but turns out pt does not have uterus.  Sent to urology.  Pt reports she was seen by ?[facility] and has a f/u appt later this mth.  Had negative CT/abd pelvis.  Notice a little blood in underwear past 2 days. Not able to tell where coming from ie urine vs rectal because she does not see well.    DM: Lab Results  Component Value Date   HGBA1C 7.1 (A) 04/13/2023  Checks BS 2 x a mth. Reports compliance with taking Humulin 70/30 20 units a.m/12 units Q p.m and Farxiga.  Does well with eating habits. Referral for eye exam with Dr. Dione Booze on last visit with me in June.  Pt no show the appt.  Does not remember being called CKD 3a:  last GFRs range 43-47.  Not on NSAIDs HTN:  limits salt in foods.  Compliant with taking Cozaar 50 mg and Norvasc 7.5 mg daily Limits salt intake Osteoporosis:  saw Dr. Brooks Sailors 04/04/23 for osteoporosis.  On Prolia injection.  Has secondary hyperPTH and low vit D.  She confirms taking calcium and vitamin D supplement as prescribed by the  endocrinologist.  Reports  ongoing issues wit poor memory which being dx with DM.  Use to speak english but over the yrs have forgotten. Denies any issues with depression. TSH done 3 mths ago was normal.  Complains of intermittent lower back pain since falling out of a hammock about 11 years ago.  Request refill on Robaxin and imaging.  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 12/28/2021   Mixed stress and urge urinary incontinence 12/28/2021   Class 2 severe obesity with serious comorbidity and body mass index (BMI) of 35.0 to 35.9 in adult [facility]) 12/28/2021   Secondary hyperparathyroidism (HCC) 12/02/2021   Age related osteoporosis 11/30/2021   Osteoporosis of multiple sites 04/21/2021   Leukocytosis 11/18/2020   Thrombocytosis 11/18/2020   Hypertension associated with stage 3 chronic kidney disease due to type 2 diabetes mellitus (HCC) 11/03/2020   Obesity (BMI 35.0-39.9 without comorbidity) 11/03/2020   Stage 3b chronic kidney disease (HCC) 04/06/2019   Hyperlipidemia associated with type 2 diabetes mellitus (HCC) 04/06/2019   Gastroesophageal reflux disease without esophagitis 04/06/2019   Type 2 diabetes, controlled, with peripheral neuropathy (HCC) 01/26/2019   Diabetic retinopathy associated with controlled type 2 diabetes mellitus (HCC) 01/26/2019   Essential hypertension 01/26/2019   Anemia 01/26/2019  Drug-induced constipation 01/26/2019   Memory difficulties 01/26/2019   Positive depression screening 01/26/2019     Current Outpatient Medications on File Prior to Visit  Medication Sig Dispense Refill   Accu-Chek FastClix Lancets MISC Use to check blood sugar 3 TIMES DAILY. (Patient taking differently: Use to check blood sugar 3 TIMES DAILY.) 100 each 2   amLODipine (NORVASC) 5 MG tablet  Take 1.5 tablets (7.5 mg total) by mouth daily. 135 tablet 1   aspirin EC 81 MG tablet Take 1 tablet (81 mg total) by mouth daily. Swallow whole. 100 tablet 1   atorvastatin (LIPITOR) 20 MG tablet Take 1 tablet (20 mg total) by mouth daily. 90 tablet 3   Blood Glucose Monitoring Suppl (ACCU-CHEK GUIDE ME) w/Device KIT Use to check blood sugar 3 TIMES DAILY. (Patient taking differently: Use to check blood sugar 3 TIMES DAILY.) 1 kit 0   clobetasol (TEMOVATE) 0.05 % external solution APPLY SMALL AMOUNT TO AFFECTED AREA 3 TIMES A WEEK AS NEEDED. 50 mL 1   Continuous Blood Gluc Receiver (FREESTYLE LIBRE READER) DEVI use as directed 1 each 0   Continuous Blood Gluc Sensor (FREESTYLE LIBRE SENSOR SYSTEM) MISC Change sensor every 2 weeks (14 days) 2 each 12   dapagliflozin propanediol (FARXIGA) 5 MG TABS tablet Take 1 tablet (5 mg total) by mouth daily before breakfast. 90 tablet 1   dapagliflozin propanediol (FARXIGA) 5 MG TABS tablet Take 1 tablet (5 mg total) by mouth daily. 90 tablet 1   ergocalciferol (VITAMIN D2) 1.25 MG (50000 UT) capsule Take 1 capsule (50,000 Units total) by mouth once a week. 4 capsule 1   fluticasone (FLONASE) 50 MCG/ACT nasal spray Place 1 spray into both nostrils daily. 16 g 0   gabapentin (NEURONTIN) 400 MG capsule Take 1 capsule (400 mg total) by mouth 2 (two) times daily. 180 capsule 3   glucose blood (ACCU-CHEK GUIDE) test strip Use to check blood sugar 3 TIMES DAILY. (Patient taking differently: Use to check blood sugar 3 TIMES DAILY.) 100 each 2   insulin NPH-regular Human (HUMULIN 70/30) (70-30) 100 UNIT/ML injection Inject 23 Units into the skin daily with breakfast AND 12 Units daily with supper. 10 mL 5   Insulin Pen Needle (TRUEPLUS PEN NEEDLES) 32G X 4 MM MISC Use to inject insulin twice daily. 100 each 2   Insulin Syringe-Needle U-100 (TRUEPLUS INSULIN SYRINGE) 31G X 5/16" 0.3 ML MISC Use to inject insulin twice daily 100 each 2   losartan (COZAAR) 50 MG tablet  Take 1 tablet (50 mg total) by mouth daily. 90 tablet 1   omeprazole (PRILOSEC) 20 MG capsule TAKE 1 CAPSULE (20 MG TOTAL) BY MOUTH DAILY. 30 capsule 3   OVER THE COUNTER MEDICATION Take 1 capsule by mouth at bedtime.     oxybutynin (DITROPAN XL) 5 MG 24 hr tablet Take 1 tablet (5 mg total) by mouth at bedtime. 30 tablet 3   topiramate (TOPAMAX) 25 MG tablet Take 1 tablet (25 mg total) by mouth at bedtime as needed. 90 tablet 1   No current facility-administered medications on file prior to visit.    No Known Allergies  Social History   Socioeconomic History   Marital status: Married    Spouse name: Not on file   Number of children: 1   Years of education: 12 grade   Highest education level: Not on file  Occupational History   Occupation: unemployed  Tobacco Use   Smoking status: Never   Smokeless tobacco:  Never  Vaping Use   Vaping status: Never Used  Substance and Sexual Activity   Alcohol use: Never   Drug use: Never   Sexual activity: Yes    Birth control/protection: Post-menopausal  Other Topics Concern   Not on file  Social History Narrative   Not on file   Social Determinants of Health   Financial Resource Strain: Medium Risk (08/06/2022)   Overall Financial Resource Strain (CARDIA)    Difficulty of Paying Living Expenses: Somewhat hard  Food Insecurity: Food Insecurity Present (08/06/2022)   Hunger Vital Sign    Worried About Running Out of Food in the Last Year: Sometimes true    Ran Out of Food in the Last Year: Sometimes true  Transportation Needs: Unmet Transportation Needs (08/06/2022)   PRAPARE - Transportation    Lack of Transportation (Medical): Yes    Lack of Transportation (Non-Medical): Yes  Physical Activity: Inactive (08/06/2022)   Exercise Vital Sign    Days of Exercise per Week: 0 days    Minutes of Exercise per Session: 0 min  Stress: No Stress Concern Present (08/06/2022)   [employer] of Occupational Health - Occupational Stress  Questionnaire    Feeling of Stress : Not at all  Social Connections: Socially Isolated (08/06/2022)   Social Connection and Isolation Panel [NHANES]    Frequency of Communication with Friends and Family: Once a week    Frequency of Social Gatherings with Friends and Family: Never    Attends Religious Services: Never    Database administrator or Organizations: No    Attends Engineer, structural: Never    Marital Status: Married  Catering manager Violence: Not on file    Family History  Problem Relation Age of Onset   Diabetes Mother    Colon cancer Neg Hx    Colon polyps Neg Hx    Esophageal cancer Neg Hx    Rectal cancer Neg Hx    Stomach cancer Neg Hx     Past Surgical History:  Procedure Laterality Date   ABDOMINAL HYSTERECTOMY     fibroid   CHOLECYSTECTOMY     INCONTINENCE SURGERY     REFRACTIVE SURGERY  2017   BL    ROS: Review of Systems Negative except as stated above  PHYSICAL EXAM: BP 128/74 (BP Location: Left Arm, Patient Position: Sitting, Cuff Size: Large)   Pulse 86   Temp 98.1 F (36.7 C) (Oral)   Ht 5\' 2"  (1.575 m)   Wt 203 lb (92.1 kg)   SpO2 95%   BMI 37.13 kg/m   Physical Exam  General appearance - alert, well appearing, pleasant elderly Hispanic female and in no distress.  Ambulates with a cane Mental status - normal mood, behavior, speech, dress, motor activity, and thought processes Mouth - mucous membranes moist, pharynx normal without lesions Nose:  cresting around entrance; mild enlargement of nasal turbinates Chest - clear to auscultation, no wheezes, rales or rhonchi, symmetric air entry Heart - normal rate, regular rhythm, normal S1, S2, no murmurs, rubs, clicks or gallops Extremities - peripheral pulses normal, no pedal edema, no clubbing or cyanosis     05/23/2023   10:32 AM 03/30/2022    1:41 PM 02/23/2021    3:02 PM  Depression screen PHQ 2/9  Decreased Interest 1 3 0  Down, Depressed, Hopeless 0  0  PHQ - 2 Score 1  3 0  Altered sleeping 1 3   Tired, decreased energy 1 3  Change in appetite 1 0   Feeling bad or failure about yourself  0 0   Trouble concentrating 0 0   Moving slowly or fidgety/restless 0 0   Suicidal thoughts 0 0   PHQ-9 Score 4 9   Difficult doing work/chores Not difficult at all         Latest Ref Rng & Units 04/13/2023   10:09 AM 04/04/2023    9:02 AM 12/06/2022   11:23 AM  CMP  Glucose 70 - 99 mg/dL 952   841   BUN 8 - 27 mg/dL 19   15   Creatinine 3.24 - 1.00 mg/dL 4.01   0.27   Sodium 253 - 144 mmol/L 136   136   Potassium 3.5 - 5.2 mmol/L 5.3   4.6   Chloride 96 - 106 mmol/L 100   101   CO2 20 - 29 mmol/L 23   24   Calcium 8.7 - 10.3 mg/dL 9.0  9.0  9.1   Total Protein 6.0 - 8.5 g/dL   7.6   Total Bilirubin 0.0 - 1.2 mg/dL   <6.6   Alkaline Phos 44 - 121 IU/L   100   AST 0 - 40 IU/L   17   ALT 0 - 32 IU/L   11    Lipid Panel     Component Value Date/Time   CHOL 130 12/06/2022 1123   TRIG 106 12/06/2022 1123   HDL 58 12/06/2022 1123   CHOLHDL 2.2 12/06/2022 1123   LDLCALC 53 12/06/2022 1123    CBC    Component Value Date/Time   WBC 10.4 02/16/2023 0953   WBC 10.3 04/19/2022 0824   RBC 4.02 02/16/2023 0953   RBC 3.70 (L) 04/19/2022 0824   HGB 11.8 02/16/2023 0953   HCT 36.7 02/16/2023 0953   PLT 464 (H) 02/16/2023 0953   MCV 91 02/16/2023 0953   MCH 29.4 02/16/2023 0953   MCH 29.7 04/19/2022 0824   MCHC 32.2 02/16/2023 0953   MCHC 31.6 04/19/2022 0824   RDW 12.9 02/16/2023 0953   LYMPHSABS 1.9 02/16/2023 0953   MONOABS 0.7 04/19/2022 0824   EOSABS 0.2 02/16/2023 0953   BASOSABS 0.1 02/16/2023 0953    ASSESSMENT AND PLAN: 1. Type 2 diabetes mellitus with retinopathy of both eyes, with long-term current use of insulin, macular edema presence unspecified, unspecified retinopathy severity (HCC) Most recent A1c close to goal.  She does not have blood sugar readings to share.  Encouraged to check blood sugars if only once a day in the mornings  before breakfast and bring readings with her to her visits.  She will continue Farxiga 5 mg daily and Humulin 70/30 20 units with breakfast and 12 units with dinner. - Ambulatory referral to Ophthalmology  2. Hypertension associated with diabetes (HCC) At goal.  Continue Norvasc 7.5 mg daily and Cozaar 50 mg daily  3. Memory difficulties Patient denies major issues with depression.  We will check a B12 level and refer to neurology - Vitamin B12  4. Seasonal allergies Continue Flonase nasal spray as needed.  Refill Claritin to use as needed - loratadine (CLARITIN) 10 MG tablet; Take 1 tablet (10 mg total) by mouth daily as needed for allergies.  Dispense: 30 tablet; Refill: 1  5. Hematuria, unspecified type Being worked up by urology.  She reports noticing some spotting in the underwear past 2 days.  We will check a urinalysis today - Urinalysis, Routine w reflex microscopic  6. Age-related osteoporosis  without current pathological fracture 7. Vitamin D deficiency Being followed by endocrinology.  According to endocrinology's note, she is receiving Prolia.  She is also on calcium and vitamin D  8. Secondary hyperparathyroidism (HCC) Secondary to vitamin D deficiency and CKD  9. Chronic bilateral low back pain without sciatica - methocarbamol (ROBAXIN) 500 MG tablet; Take 1 tablet (500 mg total) by mouth daily as needed (Medication can cause drowsiness).  Dispense: 30 tablet; Refill: 1 - DG Lumbar Spine Complete; Future  Patient was given the opportunity to ask questions.  Patient verbalized understanding of the plan and was able to repeat key elements of the plan.   This documentation was completed using Paediatric nurse.  Any transcriptional errors are unintentional.  Orders Placed This Encounter  Procedures   DG Lumbar Spine Complete   Vitamin B12   Urinalysis, Routine w reflex microscopic   Ambulatory referral to Ophthalmology     Requested Prescriptions    Signed Prescriptions Disp Refills   loratadine (CLARITIN) 10 MG tablet 30 tablet 1    Sig: Take 1 tablet (10 mg total) by mouth daily as needed for allergies.   methocarbamol (ROBAXIN) 500 MG tablet 30 tablet 1    Sig: Take 1 tablet (500 mg total) by mouth daily as needed (Medication can cause drowsiness).    Return in about 4 months (around 09/21/2023).  Jonah Blue, MD, FACP

## 2023-05-24 LAB — URINALYSIS, ROUTINE W REFLEX MICROSCOPIC
Bilirubin, UA: NEGATIVE
Ketones, UA: NEGATIVE
Leukocytes,UA: NEGATIVE
Nitrite, UA: NEGATIVE
Protein,UA: NEGATIVE
RBC, UA: NEGATIVE
Specific Gravity, UA: 1.014 (ref 1.005–1.030)
Urobilinogen, Ur: 0.2 mg/dL (ref 0.2–1.0)
pH, UA: 5.5 (ref 5.0–7.5)

## 2023-05-24 LAB — VITAMIN B12: Vitamin B-12: 981 pg/mL (ref 232–1245)

## 2023-05-24 NOTE — Addendum Note (Signed)
Addended by: Jonah Blue B on: 05/24/2023 09:28 AM   Modules accepted: Orders

## 2023-05-31 ENCOUNTER — Encounter: Payer: Self-pay | Admitting: Physician Assistant

## 2023-06-06 ENCOUNTER — Ambulatory Visit
Admission: RE | Admit: 2023-06-06 | Discharge: 2023-06-06 | Disposition: A | Discharge status: Home / Self Care | Payer: Medicare Other | Source: Ambulatory Visit | Attending: Internal Medicine | Admitting: Internal Medicine

## 2023-06-06 DIAGNOSIS — M545 Low back pain, unspecified: Secondary | ICD-10-CM | POA: Diagnosis not present

## 2023-06-06 DIAGNOSIS — G8929 Other chronic pain: Secondary | ICD-10-CM

## 2023-06-13 ENCOUNTER — Ambulatory Visit: Payer: Medicare Other | Admitting: Urology

## 2023-06-13 ENCOUNTER — Encounter: Payer: Self-pay | Admitting: Urology

## 2023-06-13 VITALS — BP 117/77 | HR 90 | Ht 59.0 in | Wt 205.0 lb

## 2023-06-13 DIAGNOSIS — Z09 Encounter for follow-up examination after completed treatment for conditions other than malignant neoplasm: Secondary | ICD-10-CM | POA: Diagnosis not present

## 2023-06-13 DIAGNOSIS — Z87898 Personal history of other specified conditions: Secondary | ICD-10-CM

## 2023-06-13 DIAGNOSIS — R310 Gross hematuria: Secondary | ICD-10-CM | POA: Insufficient documentation

## 2023-06-13 LAB — URINALYSIS, ROUTINE W REFLEX MICROSCOPIC
Bilirubin, UA: NEGATIVE
Ketones, UA: NEGATIVE
Leukocytes,UA: NEGATIVE
Nitrite, UA: NEGATIVE
Protein,UA: NEGATIVE
RBC, UA: NEGATIVE
Specific Gravity, UA: 1.015 (ref 1.005–1.030)
Urobilinogen, Ur: 0.2 mg/dL (ref 0.2–1.0)
pH, UA: 6 (ref 5.0–7.5)

## 2023-06-13 NOTE — Progress Notes (Signed)
Assessment: 1. Gross hematuria     Plan: I personally reviewed the patient's chart including provider notes, lab and imaging results. I personally reviewed the CT study from 05/02/2023 with results as noted below. Her urinalysis have been negative for blood on several occasions.  She does not currently have evidence of hematuria.  I am not convinced that her symptoms are urologic in nature however, evaluation with cystoscopy would be definitive. Will arrange for cystoscopy and pelvic exam in the near future.   Chief Complaint:  Chief Complaint  Patient presents with   Hematuria    History of Present Illness:  Chelsea Coleman is a 67 y.o. female who is seen in consultation from Georgian Co, PA-C for evaluation of hematuria.   She reports noting blood on her underwear or pad for approximately 2 months.  This occurs daily.  She does not see blood in her urine.  She denies any urinary incontinence.  No dysuria or flank pain.  No history of UTIs or kidney stones.  U/A 10/24:  0 RBC, 0-5 WBC U/A 12/24:  negative blood  CT abdomen/pelvis 05/07/23: No renal or ureteral calculi, no renal masses, no evidence of obstruction or filling defect.  No history of tobacco use. She has previously been seen by gynecology and no abnormality was noted on pelvic examination.  An interpreter was used throughout the visit.  Past Medical History:  Past Medical History:  Diagnosis Date   Cataract    states removed in 2014   Chronic kidney disease 02/14/2020   Stage 3    Diabetes mellitus without complication (HCC)    GERD (gastroesophageal reflux disease)    Hypertension     Past Surgical History:  Past Surgical History:  Procedure Laterality Date   ABDOMINAL HYSTERECTOMY     fibroid   CHOLECYSTECTOMY     INCONTINENCE SURGERY     REFRACTIVE SURGERY  2017   BL    Allergies:  No Known Allergies  Family History:  Family History  Problem Relation Age of Onset    Diabetes Mother    Colon cancer Neg Hx    Colon polyps Neg Hx    Esophageal cancer Neg Hx    Rectal cancer Neg Hx    Stomach cancer Neg Hx     Social History:  Social History   Tobacco Use   Smoking status: Never   Smokeless tobacco: Never  Vaping Use   Vaping status: Never Used  Substance Use Topics   Alcohol use: Never   Drug use: Never    Review of symptoms:  Constitutional:  Negative for unexplained weight loss, night sweats, fever, chills ENT:  Negative for nose bleeds, sinus pain, painful swallowing CV:  Negative for chest pain, shortness of breath, exercise intolerance, palpitations, loss of consciousness Resp:  Negative for cough, wheezing, shortness of breath GI:  Negative for nausea, vomiting, diarrhea, bloody stools GU:  Positives noted in HPI; otherwise negative for dysuria, urinary incontinence Neuro:  Negative for seizures, poor balance, limb weakness, slurred speech Psych:  Negative for lack of energy, depression, anxiety Endocrine:  Negative for polydipsia, polyuria, symptoms of hypoglycemia (dizziness, hunger, sweating) Hematologic:  Negative for anemia, purpura, petechia, prolonged or excessive bleeding, use of anticoagulants  Allergic:  Negative for difficulty breathing or choking as a result of exposure to anything; no shellfish allergy; no allergic response (rash/itch) to materials, foods  Physical exam: BP 117/77   Pulse 90   Ht 4\' 11"  (1.499 m)  Wt 205 lb (93 kg)   BMI 41.40 kg/m  GENERAL APPEARANCE:  Well appearing, well developed, well nourished, NAD HEENT: Atraumatic, Normocephalic, oropharynx clear. NECK: Supple without lymphadenopathy or thyromegaly. LUNGS: Clear to auscultation bilaterally. HEART: Regular Rate and Rhythm without murmurs, gallops, or rubs. ABDOMEN: Soft, non-tender, No Masses. EXTREMITIES: Moves all extremities well.  Without clubbing, cyanosis, or edema. NEUROLOGIC:  Alert and oriented x 3, normal gait, CN II-XII grossly  intact.  MENTAL STATUS:  Appropriate. BACK:  Non-tender to palpation.  No CVAT SKIN:  Warm, dry and intact.    Results: U/A: 2+ glucose

## 2023-06-15 NOTE — Telephone Encounter (Signed)
 VOB initiated for 2025.

## 2023-06-19 NOTE — Progress Notes (Signed)
 Assessment/Plan:   Chelsea Coleman is a very pleasant 68 y.o. 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 MoCA today is 23/30 . Prior MRI of the brain form 06/2021 had shown remote cerebellar strokes without other acute abnormalities.She does admit to some balance issues but attributes to diabetic neuropathy. No other cerebellar findings on exam. She has OSA and is non adherent to CPAP, discussed with patient the need to use it regularly.Etiology of memory issues is likely multifactorial, workup in progress.    Memory Impairment of unclear etiology  MRI brain without contrast to assess for underlying structural abnormality and assess vascular load  Start Donepezil  5mg  daily. Side effects discussed   Check TSH Continue vitamin D  replenishment. Follow thrombocytosis-leukocytosis with oncology Continue to control mood as per PCP Recommend using the CPAP on a regular basis and follow with Pulmonology.  Follow up retinal detachment with ophthalmology. Recommend good control of cardiovascular risk factors Folllow up in 3 months   Subjective:   The patient is here alone. Did not want her husband to come with her.    How long did patient have memory difficulties? She denies any issues. I don't know why I am here. She denies difficulty remembering new information, conversations and names.  Long-term memory is good. repeats oneself?  Endorsed Disoriented when walking into a room?  Patient denies  Leaving objects in unusual places? Endorsed, sometimes she leaves things and then she cannot remember but she states my husband moved it.   Wandering behavior?  denies .  Any personality changes?  I am more irritable  than before  Any history of depression?:  In the past, she admits to depression, seen psychologists while living in [place] after the death of her first husband.  Hallucinations or paranoia?  Denies.   Seizures?  Denies    Any sleep changes?   Sleeps well,only gets up when needing to urinate at night. Denies vivid dreams, REM behavior or sleepwalking   Sleep apnea?  Endorsed, non compliant with CPAP. I just use it occasionally when I needed.  Any hygiene concerns?  Denies   Independent of bathing and dressing?  Endorsed  Does the patient needs help with medications? Patient is in charge   Who is in charge of the finances? Her husband is in charge     Any changes in appetite?  Denies     Patient have trouble swallowing? Denies.   Does the patient cook? No, her husband works in [employer] and he does the cooking Any kitchen accidents such as leaving the stove on? Denies.   Any history of headaches?  Endorsed, she has a of migraine headaches with aura, takes Topamax  as needed. She does not have these headaches frequently.  Chronic pain ?  Endorsed, she has a history of DDD of the lumbar spine, takes muscle relaxers and gabapentin  Ambulates with difficulty?  Denies.  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   Recent falls or head injuries? Denies.   Vision changes? She had a history of retinal detachment for the last 15 years, requiring surgery, sees ophthalmology.  Unilateral weakness, numbness or tingling? She has diabetic neuropathy, denies any one sided weakness or one sided numbness.   Any tremors?   Denies.   Any anosmia?  Denies.   Any incontinence of urine?  Endorsed.  She had recent urinary dysfunction with reported hematuria for the last 2 months and is scheduled for cystoscopy soon (although  the source is unclear if it is GU or GI).  Of note, CT of the AP negative for abnormalities. She is concerned that it may be from vaginal source.  Any bowel dysfunction?  Chronic constipation.      Patient lives with her husband  History of heavy alcohol intake? Denies.  Never did History of heavy tobacco use? Denies.  Never smoked  Family history of dementia? Denies.  Does patient drive? Doe snot drive due to vision problems    Recent labs December 2024 B12 981, A1c 7.1 vitamin D  29.08 with abnormal PTH 122 (history of CKD)   Past Medical History:  Diagnosis Date   Cataract    states removed in 2014   Chronic kidney disease 02/14/2020   Stage 3    Diabetes mellitus without complication (HCC)    GERD (gastroesophageal reflux disease)    Hypertension      Past Surgical History:  Procedure Laterality Date   ABDOMINAL HYSTERECTOMY     fibroid   CHOLECYSTECTOMY     INCONTINENCE SURGERY     REFRACTIVE SURGERY  2017   BL     No Known Allergies  Current Outpatient Medications  Medication Instructions   Accu-Chek FastClix Lancets MISC Use to check blood sugar 3 TIMES DAILY.   amLODipine  (NORVASC ) 7.5 mg, Oral, Daily   aspirin  EC 81 mg, Oral, Daily, Swallow whole.   atorvastatin  (LIPITOR) 20 mg, Oral, Daily   Blood Glucose Monitoring Suppl (ACCU-CHEK GUIDE ME) w/Device KIT Use to check blood sugar 3 TIMES DAILY.   clobetasol  (TEMOVATE ) 0.05 % external solution APPLY SMALL AMOUNT TO AFFECTED AREA 3 TIMES A WEEK AS NEEDED.   Continuous Blood Gluc Receiver (FREESTYLE LIBRE READER) DEVI use as directed   Continuous Blood Gluc Sensor (FREESTYLE LIBRE SENSOR SYSTEM) MISC Change sensor every 2 weeks (14 days)   dapagliflozin  propanediol (FARXIGA ) 5 mg, Oral, Daily   Farxiga  5 mg, Oral, Daily before breakfast   fluticasone  (FLONASE ) 50 MCG/ACT nasal spray 1 spray, Each Nare, Daily   gabapentin  (NEURONTIN ) 400 mg, Oral, 2 times daily   glucose blood (ACCU-CHEK GUIDE) test strip Use to  check blood sugar 3 TIMES DAILY.   insulin  NPH-regular Human (HUMULIN  70/30) (70-30) 100 UNIT/ML injection Inject 23 Units into the skin daily with breakfast AND 12 Units daily with supper.   Insulin  Pen Needle (TRUEPLUS PEN NEEDLES) 32G X 4 MM MISC Use to inject insulin  twice daily.   Insulin  Syringe-Needle U-100 (TRUEPLUS INSULIN  SYRINGE) 31G X 5/16 0.3 ML MISC Use to inject insulin  twice daily   loratadine  (CLARITIN ) 10 mg, Oral, Daily PRN   losartan  (COZAAR ) 50 mg, Oral, Daily   methocarbamol  (ROBAXIN ) 500 mg, Oral, Daily PRN   omeprazole  (PRILOSEC) 20 MG capsule TAKE 1 CAPSULE (20 MG TOTAL) BY MOUTH DAILY.   OVER THE COUNTER MEDICATION 1 capsule, Daily at bedtime   oxybutynin  (DITROPAN  XL) 5 mg, Oral, Daily at bedtime   topiramate  (TOPAMAX ) 25 mg, Oral, At bedtime PRN   Vitamin D  (Ergocalciferol ) (DRISDOL ) 50,000 Units, Oral, Weekly     VITALS:   Vitals:   06/20/23 1004  BP: 132/65  Pulse: 83  Resp: 20  SpO2: 97%  Weight: 208 lb (94.3 kg)      PHYSICAL EXAM   HEENT:  Normocephalic, atraumatic. The superficial temporal arteries are without ropiness or tenderness. Cardiovascular: Regular rate and rhythm. Lungs: Clear to auscultation bilaterally. Neck: There are no carotid bruits noted bilaterally.  NEUROLOGICAL:     No data to display             08/06/2022    9:08 AM 02/23/2021    3:05 PM 02/04/2020    9:11 AM  MMSE - Mini Mental State Exam  Orientation to time 5 5 5   Orientation to Place 1 5 5   Registration 3 3 3   Attention/ Calculation 5 5 5   Recall 3 3 3   Language- name 2 objects 2 2 2   Language- repeat 1 1 1   Language- follow 3 step command 3 3 3   Language- read & follow direction 1 1 1   Write a sentence 1 1 1   Copy design 1 1 0  Total score 26 30 29      Orientation:  Alert and oriented to person, place and time. No aphasia or dysarthria. Fund of knowledge is appropriate. Recent memory impaired and remote memory intact.  Attention and concentration  are normal.  Able to name objects and repeat phrases. Delayed recall 4 /5 Cranial nerves: There is good facial symmetry. Extraocular muscles are intact and visual fields are full to confrontational testing. Speech is fluent and clear. No tongue deviation. Hearing is intact to conversational tone. Tone: Tone is good throughout. Sensation: Sensation is intact to light touch. Vibration is intact at the bilateral big toe.  Coordination: The patient has no difficulty with RAM's or FNF bilaterally. Normal finger to nose  Motor: Strength is 5/5 in the bilateral upper and lower extremities. There is no pronator drift. There are no fasciculations noted. DTR's: Deep tendon reflexes are 2/4 bilaterally. Gait and Station: The patient is able to ambulate without difficulty The patient is able to heel toe walk . Gait is cautious and narrow. The patient is able to ambulate in a tandem fashion.       Thank you for allowing us  the opportunity to participate in the care of this nice patient. Please do not hesitate to contact us  for any questions or concerns.   Total time spent on today's visit was 61 minutes dedicated to this patient today, preparing to see patient, examining the patient, ordering tests and/or medications and counseling the patient, documenting clinical information in the EHR or other health record, independently interpreting results and communicating results to the patient/family, discussing treatment and goals, answering patient's questions and coordinating care.  Cc:  Vicci Barnie NOVAK, MD  Camie Sevin 06/20/2023 10:40 AM

## 2023-06-19 NOTE — Telephone Encounter (Signed)
 Prior Auth for [employer] APPROVED via [email or web address] PA# [id number] Valid: 05/07/23-05/06/24

## 2023-06-19 NOTE — Telephone Encounter (Signed)
 Prior Authorization REQUIRED for [employer]  PA PROCESS DETAILS: Prior Authorization is Required. We are unable to confirm if a PA is on file. Please check your records or contact the payer.

## 2023-06-20 ENCOUNTER — Other Ambulatory Visit: Payer: Self-pay

## 2023-06-20 ENCOUNTER — Other Ambulatory Visit: Payer: Medicare Other

## 2023-06-20 ENCOUNTER — Ambulatory Visit: Payer: Medicare Other | Admitting: Physician Assistant

## 2023-06-20 ENCOUNTER — Encounter: Payer: Self-pay | Admitting: Physician Assistant

## 2023-06-20 VITALS — BP 132/65 | HR 83 | Resp 20 | Wt 208.0 lb

## 2023-06-20 DIAGNOSIS — R413 Other amnesia: Secondary | ICD-10-CM | POA: Diagnosis not present

## 2023-06-20 MED ORDER — DONEPEZIL HCL 5 MG PO TABS
5.0000 mg | ORAL_TABLET | Freq: Every day | ORAL | 11 refills | Status: DC
  Filled 2023-06-20: qty 30, 30d supply, fill #0
  Filled 2023-07-21: qty 30, 30d supply, fill #1
  Filled 2023-08-29: qty 30, 30d supply, fill #2
  Filled 2023-10-21: qty 30, 30d supply, fill #3
  Filled 2023-11-27: qty 30, 30d supply, fill #4
  Filled 2024-02-06: qty 30, 30d supply, fill #5
  Filled 2024-03-11: qty 30, 30d supply, fill #6

## 2023-06-20 NOTE — Patient Instructions (Addendum)
 It was a pleasure to see you today at our office.   Recommendations:    MRI of the brain, the radiology office will call you to arrange you appointment   Check labs today   Follow up in 3  months  Use el CPAP Tome donepezil  5 mg al dia.  Cuide sus globulos rojos y blancos    For psychiatric meds, mood meds: Please have your primary care physician manage these medications.  If you have any severe symptoms of a stroke, or other severe issues such as confusion,severe chills or fever, etc call 911 or go to the ER as you may need to be evaluated further     For assessment of decision of mental capacity and competency:  Call Dr. Rosaline Nine, geriatric psychiatrist at [phone]  Counseling regarding caregiver distress, including caregiver depression, anxiety and issues regarding community resources, adult day care programs, adult living facilities, or memory care questions:  please contact your  Primary Doctor's Social Worker   Whom to call: Memory  decline, memory medications: Call our office [phone]    [email or web address]   RECOMMENDATIONS FOR ALL PATIENTS WITH MEMORY PROBLEMS: 1. Continue to exercise (Recommend 30 minutes of walking everyday, or 3 hours every week) 2. Increase social interactions - continue going to [employer] and enjoy social gatherings with friends and family 3. Eat healthy, avoid fried foods and eat more fruits and vegetables 4. Maintain adequate blood pressure, blood sugar, and blood cholesterol level. Reducing the risk of stroke and cardiovascular disease also helps promoting better memory. 5. Avoid stressful situations. Live a simple life and avoid aggravations. Organize your time and prepare for the next day in anticipation. 6. Sleep well, avoid any interruptions of sleep and avoid any distractions in the bedroom that may interfere with adequate sleep quality 7. Avoid sugar, avoid sweets as there is a  strong link between excessive sugar intake, diabetes, and cognitive impairment We discussed the Mediterranean diet, which has been shown to help patients reduce the risk of progressive memory disorders and reduces cardiovascular risk. This includes eating fish, eat fruits and green leafy vegetables, nuts like almonds and hazelnuts, walnuts, and also use olive oil. Avoid fast foods and fried foods as much as possible. Avoid sweets and sugar as sugar use has been linked to worsening of memory function.  There is always a concern of gradual progression of memory problems. If this is the case, then we may need to adjust level of care according to patient needs. Support, both to the patient and caregiver, should then be put into place.         FALL PRECAUTIONS: Be cautious when walking. Scan the area for obstacles that may increase the risk of trips and falls. When getting up in the mornings, sit up at the edge of the bed for a few minutes before getting out of bed. Consider elevating the bed at the head end to avoid drop of blood pressure when getting up. Walk always in a well-lit room (use night lights in the walls). Avoid area rugs or power cords from appliances in the middle of the walkways. Use a walker or a cane if necessary and consider physical therapy for balance exercise. Get your eyesight checked regularly.  FINANCIAL OVERSIGHT: Supervision, especially oversight when making financial decisions or transactions is also recommended.  HOME SAFETY: Consider the safety of the kitchen when operating appliances like stoves, microwave oven, and blender. Consider having supervision and share cooking responsibilities until no longer able  to participate in those. Accidents with firearms and other hazards in the house should be identified and addressed as well.   ABILITY TO BE LEFT ALONE: If patient is unable to contact 911 operator, consider using LifeLine, or when the need is there, arrange for someone to  stay with patients. Smoking is a fire hazard, consider supervision or cessation. Risk of wandering should be assessed by caregiver and if detected at any point, supervision and safe proof recommendations should be instituted.  MEDICATION SUPERVISION: Inability to self-administer medication needs to be constantly addressed. Implement a mechanism to ensure safe administration of the medications.      Mediterranean Diet A Mediterranean diet refers to food and lifestyle choices that are based on the traditions of countries located on the [place]. This way of eating has been shown to help prevent certain conditions and improve outcomes for people who have chronic diseases, like kidney disease and heart disease. What are tips for following this plan? Lifestyle  Cook and eat meals together with your family, when possible. Drink enough fluid to keep your urine clear or pale yellow. Be physically active every day. This includes: Aerobic exercise like running or swimming. Leisure activities like gardening, walking, or housework. Get 7-8 hours of sleep each night. If recommended by your health care provider, drink red wine in moderation. This means 1 glass a day for nonpregnant women and 2 glasses a day for men. A glass of wine equals 5 oz (150 mL). Reading food labels  Check the serving size of packaged foods. For foods such as rice and pasta, the serving size refers to the amount of cooked product, not dry. Check the total fat in packaged foods. Avoid foods that have saturated fat or trans fats. Check the ingredients list for added sugars, such as corn syrup. Shopping  At the grocery store, buy most of your food from the areas near the walls of the store. This includes: Fresh fruits and vegetables (produce). Grains, beans, nuts, and seeds. Some of these may be available in unpackaged forms or large amounts (in bulk). Fresh seafood. Poultry and eggs. Low-fat dairy products. Buy whole  ingredients instead of prepackaged foods. Buy fresh fruits and vegetables in-season from local farmers markets. Buy frozen fruits and vegetables in resealable bags. If you do not have access to quality fresh seafood, buy precooked frozen shrimp or canned fish, such as tuna, salmon, or sardines. Buy small amounts of raw or cooked vegetables, salads, or olives from the deli or salad bar at your store. Stock your pantry so you always have certain foods on hand, such as olive oil, canned tuna, canned tomatoes, rice, pasta, and beans. Cooking  Cook foods with extra-virgin olive oil instead of using butter or other vegetable oils. Have meat as a side dish, and have vegetables or grains as your main dish. This means having meat in small portions or adding small amounts of meat to foods like pasta or stew. Use beans or vegetables instead of meat in common dishes like chili or lasagna. Experiment with different cooking methods. Try roasting or broiling vegetables instead of steaming or sauteing them. Add frozen vegetables to soups, stews, pasta, or rice. Add nuts or seeds for added healthy fat at each meal. You can add these to yogurt, salads, or vegetable dishes. Marinate fish or vegetables using olive oil, lemon juice, garlic, and fresh herbs. Meal planning  Plan to eat 1 vegetarian meal one day each week. Try to work up to 2  vegetarian meals, if possible. Eat seafood 2 or more times a week. Have healthy snacks readily available, such as: Vegetable sticks with hummus. Greek yogurt. Fruit and nut trail mix. Eat balanced meals throughout the week. This includes: Fruit: 2-3 servings a day Vegetables: 4-5 servings a day Low-fat dairy: 2 servings a day Fish, poultry, or lean meat: 1 serving a day Beans and legumes: 2 or more servings a week Nuts and seeds: 1-2 servings a day Whole grains: 6-8 servings a day Extra-virgin olive oil: 3-4 servings a day Limit red meat and sweets to only a few servings  a month What are my food choices? Mediterranean diet Recommended Grains: Whole-grain pasta. Brown rice. Bulgar wheat. Polenta. Couscous. Whole-wheat bread. [place] Madeira. Vegetables: Artichokes. Beets. Broccoli. Cabbage. Carrots. Eggplant. Green beans. Chard. Kale. Spinach. Onions. Leeks. Peas. Squash. Tomatoes. Peppers. Radishes. Fruits: Apples. Apricots. Avocado. Berries. Bananas. Cherries. Dates. Figs. Grapes. Lemons. Melon. Oranges. Peaches. Plums. Pomegranate. Meats and other protein foods: Beans. Almonds. Sunflower seeds. Pine nuts. Peanuts. Cod. Salmon. Scallops. Shrimp. Tuna. Tilapia. Clams. Oysters. Eggs. Dairy: Low-fat milk. Cheese. Greek yogurt. Beverages: Water. Red wine. Herbal tea. Fats and oils: Extra virgin olive oil. Avocado oil. Grape seed oil. Sweets and desserts: Greek yogurt with honey. Baked apples. Poached pears. Trail mix. Seasoning and other foods: Basil. Cilantro. Coriander. Cumin. Mint. Parsley. Sage. Rosemary. Tarragon. Garlic. Oregano. Thyme. Pepper. Balsalmic vinegar. Tahini. Hummus. Tomato sauce. Olives. Mushrooms. Limit these Grains: Prepackaged pasta or rice dishes. Prepackaged cereal with added sugar. Vegetables: Deep fried potatoes (french fries). Fruits: Fruit canned in syrup. Meats and other protein foods: Beef. Pork. Lamb. Poultry with skin. Hot dogs. [place]. Dairy: Ice cream. Sour cream. Whole milk. Beverages: Juice. Sugar-sweetened soft drinks. Beer. Liquor and spirits. Fats and oils: Butter. Canola oil. Vegetable oil. Beef fat (tallow). Lard. Sweets and desserts: Cookies. Cakes. Pies. Candy. Seasoning and other foods: Mayonnaise. Premade sauces and marinades. The items listed may not be a complete list. Talk with your dietitian about what dietary choices are right for you. Summary The Mediterranean diet includes both food and lifestyle choices. Eat a variety of fresh fruits and vegetables, beans, nuts, seeds, and whole grains. Limit the amount of  red meat and sweets that you eat. Talk with your health care provider about whether it is safe for you to drink red wine in moderation. This means 1 glass a day for nonpregnant women and 2 glasses a day for men. A glass of wine equals 5 oz (150 mL). This information is not intended to replace advice given to you by your health care provider. Make sure you discuss any questions you have with your health care provider. Document Released: 01/22/2016 Document Revised: 02/24/2016 Document Reviewed: 01/22/2016 Elsevier Interactive Patient Education  2017 [employer].

## 2023-06-27 ENCOUNTER — Telehealth: Payer: Self-pay

## 2023-06-27 NOTE — Telephone Encounter (Signed)
 Multiple attempts from [facility] to get patient scheduled for MRI brain wo contrast,FYI.

## 2023-07-11 ENCOUNTER — Other Ambulatory Visit: Payer: Medicare Other | Admitting: Urology

## 2023-07-12 ENCOUNTER — Other Ambulatory Visit: Payer: Medicare Other | Admitting: Urology

## 2023-07-12 NOTE — Telephone Encounter (Signed)
Patient is ready for scheduling on or after 06/15/23 BUY AND BILL  Out-of-pocket cost due at time of visit: $351.87  Primary: Medicare Prolia co-insurance: 20% (approximately $331.87) Admin fee co-insurance: $20  Deductible: does not apply  Prior Auth: APPROVED PA# [id number] Valid: 05/07/23-05/06/24  Secondary: N/A Prolia co-insurance:  Admin fee co-insurance:  Deductible:  Prior Auth:  PA# Valid:   ** This summary of benefits is an estimation of the patient's out-of-pocket cost. Exact cost may vary based on individual plan coverage.

## 2023-07-18 ENCOUNTER — Encounter: Payer: Self-pay | Admitting: Urology

## 2023-07-18 ENCOUNTER — Other Ambulatory Visit: Payer: Self-pay

## 2023-07-18 ENCOUNTER — Ambulatory Visit: Payer: Medicare Other | Admitting: Urology

## 2023-07-18 VITALS — BP 120/76 | HR 96 | Ht 67.0 in | Wt 205.0 lb

## 2023-07-18 DIAGNOSIS — N939 Abnormal uterine and vaginal bleeding, unspecified: Secondary | ICD-10-CM

## 2023-07-18 DIAGNOSIS — R32 Unspecified urinary incontinence: Secondary | ICD-10-CM | POA: Diagnosis not present

## 2023-07-18 DIAGNOSIS — R310 Gross hematuria: Secondary | ICD-10-CM | POA: Diagnosis not present

## 2023-07-18 LAB — URINALYSIS, ROUTINE W REFLEX MICROSCOPIC
Bilirubin, UA: NEGATIVE
Ketones, UA: NEGATIVE
Leukocytes,UA: NEGATIVE
Nitrite, UA: NEGATIVE
RBC, UA: NEGATIVE
Specific Gravity, UA: 1.02 (ref 1.005–1.030)
Urobilinogen, Ur: 0.2 mg/dL (ref 0.2–1.0)
pH, UA: 5.5 (ref 5.0–7.5)

## 2023-07-18 LAB — MICROSCOPIC EXAMINATION
Bacteria, UA: NONE SEEN
Mucus, UA: NONE SEEN
RBC, Urine: NONE SEEN /HPF (ref 0–2)
Renal Epithel, UA: NONE SEEN /HPF
Trichomonas, UA: NONE SEEN
WBC, UA: NONE SEEN /HPF (ref 0–5)
Yeast, UA: NONE SEEN

## 2023-07-18 MED ORDER — SOLIFENACIN SUCCINATE 10 MG PO TABS
10.0000 mg | ORAL_TABLET | Freq: Every day | ORAL | 11 refills | Status: AC
  Filled 2023-07-18: qty 30, 30d supply, fill #0
  Filled 2023-08-29 – 2023-10-21 (×2): qty 30, 30d supply, fill #1
  Filled 2023-11-27: qty 30, 30d supply, fill #2
  Filled 2024-02-06: qty 30, 30d supply, fill #3
  Filled 2024-03-11: qty 30, 30d supply, fill #4
  Filled 2024-04-16: qty 30, 30d supply, fill #5
  Filled 2024-05-28: qty 30, 30d supply, fill #6
  Filled 2024-07-01: qty 30, 30d supply, fill #7

## 2023-07-18 MED ORDER — CIPROFLOXACIN HCL 500 MG PO TABS
500.0000 mg | ORAL_TABLET | Freq: Once | ORAL | Status: AC
  Administered 2023-07-18: 500 mg via ORAL

## 2023-07-18 NOTE — Progress Notes (Signed)
Assessment: 1. Urinary incontinence in female   2. Vaginal bleeding     Plan: Her urinalysis have been negative for blood on several occasions.   She does not currently have evidence of hematuria on urinalysis today. Cystoscopy showed a normal urethra and bladder. I do not see an obvious urologic source for the bleeding that she is describing.  Recommend further evaluation for possible GYN or GI sources of the bleeding. D/C oxybutynin Trial of solifenacin 10 mg daily.  Rx sent. Return to office in 1 month  Chief Complaint:  Chief Complaint  Patient presents with   Hematuria    History of Present Illness:  Chelsea Coleman is a 69 y.o. female who is seen for further evaluation of hematuria.   At her initial visit in 12/24, she reported noting blood on her underwear or pad for approximately 2 months.  This was occurring daily.  She had not seen blood in her urine.  She denied any urinary incontinence.  No dysuria or flank pain.  No history of UTI's or kidney stones.  U/A 10/24:  0 RBC, 0-5 WBC U/A 12/24:  negative blood  CT abdomen/pelvis 05/07/23: No renal or ureteral calculi, no renal masses, no evidence of obstruction or filling defect.  No history of tobacco use. She has previously been seen by gynecology and no abnormality was noted on pelvic examination.  She presents today for further evaluation with cystoscopy. She continues to notice blood on her underwear and with wiping.  No dysuria or gross hematuria.   She reports increased problems with incontinence.  She is leaking both daytime and nighttime.  No urgency or awareness of incontinence. She is currently taking oxybutynin XL 5 mg nightly.  An interpreter was used throughout the visit.  Past Medical History:  Past Medical History:  Diagnosis Date   Cataract    states removed in 2014   Chronic kidney disease 02/14/2020   Stage 3    Diabetes mellitus without complication (HCC)    GERD  (gastroesophageal reflux disease)    Hypertension     Past Surgical History:  Past Surgical History:  Procedure Laterality Date   ABDOMINAL HYSTERECTOMY     fibroid   CHOLECYSTECTOMY     INCONTINENCE SURGERY     REFRACTIVE SURGERY  2017   BL    Allergies:  No Known Allergies  Family History:  Family History  Problem Relation Age of Onset   Diabetes Mother    Colon cancer Neg Hx    Colon polyps Neg Hx    Esophageal cancer Neg Hx    Rectal cancer Neg Hx    Stomach cancer Neg Hx     Social History:  Social History   Tobacco Use   Smoking status: Never   Smokeless tobacco: Never  Vaping Use   Vaping status: Never Used  Substance Use Topics   Alcohol use: Never   Drug use: Never    ROS: Constitutional:  Negative for fever, chills, weight loss CV: Negative for chest pain, previous MI, hypertension Respiratory:  Negative for shortness of breath, wheezing, sleep apnea, frequent cough GI:  Negative for nausea, vomiting, bloody stool, GERD  Physical exam: BP 120/76   Pulse 96   Ht 5\' 7"  (1.702 m)   Wt 205 lb (93 kg)   BMI 32.11 kg/m  GENERAL APPEARANCE:  Well appearing, well developed, well nourished, NAD HEENT:  Atraumatic, normocephalic, oropharynx clear NECK:  Supple without lymphadenopathy or thyromegaly ABDOMEN:  Soft, non-tender, no masses EXTREMITIES:  Moves all extremities well, without clubbing, cyanosis, or edema NEUROLOGIC:  Alert and oriented x 3, normal gait, CN II-XII grossly intact MENTAL STATUS:  appropriate BACK:  Non-tender to palpation, No CVAT SKIN:  Warm, dry, and intact  Results: U/A: 0 RBC, 0 WBC  CYSTOSCOPY  Procedure: Flexible cystoscopy  Pre-Operative Diagnosis:  Gross hematuria  Post-Operative Diagnosis:  Normal urethra and bladder  Anesthesia: local with lidocaine gel  Surgical Narrative:  After appropriate informed consent was obtained, the patient was prepped and draped in the usual sterile fashion in the supine  position. She was correctly identified and the proper procedure delineated prior to proceeding. Sterile lidocaine gel was instilled in the urethra.  The flexible cystoscope was introduced without difficulty.  Findings:  Urethra: Normal  Bladder:  no mucosal abnormalities seen  Ureteral orifices:  clear efflux bilaterally  Additional findings: none  A bladder wash was not obtained for cytology.  Pelvic exam showed pelvic prolapse with cystocele.  No vaginal lesions seen.  She tolerated the procedure well.  A chaperone was present throughout the procedure.

## 2023-07-19 NOTE — Telephone Encounter (Signed)
LMx1 through [employer] to call office to schedule next Prolia injection.

## 2023-07-21 ENCOUNTER — Other Ambulatory Visit: Payer: Self-pay

## 2023-07-22 ENCOUNTER — Other Ambulatory Visit: Payer: Self-pay

## 2023-07-22 NOTE — Telephone Encounter (Signed)
 LMx1 through [employer] to call office to schedule next Prolia injection.

## 2023-07-25 ENCOUNTER — Other Ambulatory Visit: Payer: Self-pay

## 2023-08-05 ENCOUNTER — Other Ambulatory Visit (HOSPITAL_BASED_OUTPATIENT_CLINIC_OR_DEPARTMENT_OTHER): Payer: Self-pay

## 2023-08-05 ENCOUNTER — Encounter: Payer: Self-pay | Admitting: Family Medicine

## 2023-08-05 ENCOUNTER — Ambulatory Visit: Payer: Medicare Other | Admitting: Family Medicine

## 2023-08-05 ENCOUNTER — Ambulatory Visit: Payer: Self-pay | Admitting: Internal Medicine

## 2023-08-05 VITALS — BP 138/68 | HR 95 | Temp 97.9°F | Resp 18 | Ht 67.0 in | Wt 198.6 lb

## 2023-08-05 DIAGNOSIS — R051 Acute cough: Secondary | ICD-10-CM

## 2023-08-05 DIAGNOSIS — R6889 Other general symptoms and signs: Secondary | ICD-10-CM | POA: Diagnosis not present

## 2023-08-05 DIAGNOSIS — J0140 Acute pansinusitis, unspecified: Secondary | ICD-10-CM | POA: Diagnosis not present

## 2023-08-05 LAB — POC COVID19 BINAXNOW: SARS Coronavirus 2 Ag: NEGATIVE

## 2023-08-05 LAB — POC INFLUENZA A&B (BINAX/QUICKVUE)
Influenza A, POC: NEGATIVE
Influenza B, POC: NEGATIVE

## 2023-08-05 MED ORDER — AMOXICILLIN-POT CLAVULANATE 875-125 MG PO TABS
1.0000 | ORAL_TABLET | Freq: Two times a day (BID) | ORAL | 0 refills | Status: DC
  Filled 2023-08-05: qty 20, 10d supply, fill #0

## 2023-08-05 MED ORDER — BENZONATATE 100 MG PO CAPS
200.0000 mg | ORAL_CAPSULE | Freq: Three times a day (TID) | ORAL | 0 refills | Status: DC | PRN
  Filled 2023-08-05: qty 40, 7d supply, fill #0

## 2023-08-05 NOTE — Telephone Encounter (Addendum)
   This RN first attempt to contact patient for triage using [facility] UX#[id number]. No answer, voicemail left requesting return call to clinic.

## 2023-08-05 NOTE — Telephone Encounter (Signed)
  Chief Complaint: Cough Symptoms: productive cough, SOB Frequency: 15 days Pertinent Negatives: Patient denies CP, fever, NVD Disposition: [] ED /[] Urgent Care (no appt availability in office) / [x] Appointment(In office/virtual)/ []  [facility]/ [] Home Care/ [] Refused Recommended Disposition /[] [facility]/ []  Follow-up with PCP Additional Notes: [employer] interpreter Byrd Hesselbach [phone]. Patient calls reporting worsening productive cough x 15 days. Reports green phlegm, states cough is intermittent but severe when coughing. Reports mild SOB at rest, speaking clearly in full sentences. Per protocol, patient to be evaluated within 4 hours. First available appointment with PCP is outside of guideline. Patient scheduled with first available provider in alternate clinic for today at 1320. Care advice reviewed, patient verbalized understanding and denies further questions at this time. Alerting PCP for review.    Reason for Disposition  [1] MILD difficulty breathing (e.g., minimal/no SOB at rest, SOB with walking, pulse <100) AND [2] still present when not coughing  Answer Assessment - Initial Assessment Questions 1. ONSET: "When did the cough begin?"      Began 15 days ago, states her husband has also been sick recently. 2. SEVERITY: "How bad is the cough today?"      Cough is intermittent, but severe when coughing 3. SPUTUM: "Describe the color of your sputum" (none, dry cough; clear, white, yellow, green)     green 4. HEMOPTYSIS: "Are you coughing up any blood?" If so ask: "How much?" (flecks, streaks, tablespoons, etc.)     Denies 5. DIFFICULTY BREATHING: "Are you having difficulty breathing?" If Yes, ask: "How bad is it?" (e.g., mild, moderate, severe)    - MILD: No SOB at rest, mild SOB with walking, speaks normally in sentences, can lie down, no retractions, pulse < 100.    - MODERATE: SOB at rest, SOB with minimal exertion and prefers to sit, cannot lie down flat, speaks in  phrases, mild retractions, audible wheezing, pulse 100-120.    - SEVERE: Very SOB at rest, speaks in single words, struggling to breathe, sitting hunched forward, retractions, pulse > 120      SOB at rest 6. FEVER: "Do you have a fever?" If Yes, ask: "What is your temperature, how was it measured, and when did it start?"     Denies 7. CARDIAC HISTORY: "Do you have any history of heart disease?" (e.g., heart attack, congestive heart failure)      Denies 8. LUNG HISTORY: "Do you have any history of lung disease?"  (e.g., pulmonary embolus, asthma, emphysema)     Denies 9. PE RISK FACTORS: "Do you have a history of blood clots?" (or: recent major surgery, recent prolonged travel, bedridden)     Denies 10. OTHER SYMPTOMS: "Do you have any other symptoms?" (e.g., runny nose, wheezing, chest pain)       Pain in back upper back, bilateral, dizziness, nasal congestion  12. TRAVEL: "Have you traveled out of the country in the last month?" (e.g., travel history, exposures)       Denies  Protocols used: Cough - Acute Productive-A-AH

## 2023-08-05 NOTE — Progress Notes (Signed)
 Established Patient Office Visit  Subjective   Patient ID: Chelsea Coleman de Chelsea Coleman, female    DOB: 12-16-1955  Age: 68 y.o. MRN: [id number]  Chief Complaint  Patient presents with   Cough    Sxs for 15 days, productive cough, coughing hard to make her gag. No COVID test     HPI Discussed the use of AI scribe software for clinical note transcription with the patient, who gave verbal consent to proceed.  History of Present Illness   Chelsea Coleman is a 68 year old female with diabetes who presents with a two-week history of cough and headaches.   She is taking theraflu and has flonase at home.    She has been experiencing a productive cough for approximately two weeks, characterized by green sputum. No fevers have been noted, but she feels congested in the head.  h  She reports headaches over frontal and max areas. well-controlled.      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 12/28/2021   Mixed stress and urge urinary incontinence 12/28/2021   Class 2 severe obesity with serious comorbidity and body mass index (BMI) of 35.0 to 35.9 in adult [facility]) 12/28/2021   Secondary hyperparathyroidism (HCC) 12/02/2021   Age related osteoporosis 11/30/2021   Osteoporosis of multiple sites 04/21/2021   Leukocytosis 11/18/2020   Thrombocytosis 11/18/2020   Hypertension associated with stage 3 chronic kidney disease due to type 2 diabetes mellitus (HCC) 11/03/2020   Obesity (BMI 35.0-39.9 without comorbidity) 11/03/2020   Stage 3b chronic kidney disease (HCC) 04/06/2019   Hyperlipidemia associated with type 2 diabetes mellitus (HCC) 04/06/2019   Gastroesophageal reflux disease without esophagitis 04/06/2019   Type 2 diabetes, controlled, with peripheral neuropathy (HCC) 01/26/2019   Diabetic retinopathy  associated with controlled type 2 diabetes mellitus (HCC) 01/26/2019   Essential hypertension 01/26/2019   Anemia 01/26/2019   Drug-induced constipation 01/26/2019   Memory impairment 01/26/2019   Positive depression screening 01/26/2019   Past Medical History:  Diagnosis Date   Cataract    states removed in 2014   Chronic kidney disease 02/14/2020   Stage 3    Diabetes mellitus without complication (HCC)    GERD (gastroesophageal reflux disease)    Hypertension    Past Surgical History:  Procedure Laterality Date   ABDOMINAL HYSTERECTOMY     fibroid   CHOLECYSTECTOMY     INCONTINENCE SURGERY     REFRACTIVE SURGERY  2017   BL   Social History   Tobacco Use   Smoking status: Never   Smokeless tobacco: Never  Vaping Use   Vaping status: Never Used  Substance Use Topics   Alcohol use: Never   Drug use: Never   Social History   Socioeconomic History   Marital status: Married    Spouse name: Not on file   Number of children: 1   Years of education: 12 grade   Highest education level: Not on file  Occupational History   Occupation: unemployed  Tobacco Use   Smoking status: Never   Smokeless tobacco: Never  Vaping Use   Vaping status: Never Used  Substance and Sexual Activity   Alcohol use: Never   Drug use: Never   Sexual activity: Yes    Birth control/protection: Post-menopausal  Other Topics Concern   Not on file  Social History Narrative   Not on file   Social Drivers of Health   Financial Resource Strain: Medium Risk (08/06/2022)   Overall Financial Resource Strain (CARDIA)    Difficulty of Paying Living Expenses: Somewhat hard  Food Insecurity: Food Insecurity Present (08/06/2022)   Hunger Vital Sign    Worried About Running Out of Food in the Last Year: Sometimes true    Ran Out of Food in the Last Year: Sometimes true  Transportation Needs: Unmet Transportation Needs (08/06/2022)   PRAPARE - Transportation    Lack of Transportation (Medical): Yes     Lack of Transportation (Non-Medical): Yes  Physical Activity: Inactive (08/06/2022)   Exercise Vital Sign    Days of Exercise per Week: 0 days    Minutes of Exercise per Session: 0 min  Stress: No Stress Concern Present (08/06/2022)   [employer] of Occupational Health - Occupational Stress Questionnaire    Feeling of Stress : Not at all  Social Connections: Socially Isolated (08/06/2022)   Social Connection and Isolation Panel [NHANES]    Frequency of Communication with Friends and Family: Once a week    Frequency of Social Gatherings with Friends and Family: Never    Attends Religious Services: Never    Database administrator or Organizations: No    Attends Engineer, structural: Never    Marital Status: Married  Catering manager Violence: Not on file   Family Status  Relation Name Status   Mother  (Not Specified)   Neg Hx  (Not Specified)  No partnership data on file   Family History  Problem Relation Age of Onset   Diabetes Mother    Colon cancer Neg Hx    Colon polyps Neg Hx    Esophageal cancer Neg Hx    Rectal cancer Neg Hx    Stomach cancer Neg Hx    No Known Allergies    Review of Systems  Constitutional:  Negative for fever and malaise/fatigue.  HENT:  Positive for congestion and sinus pain. Negative for sore throat.   Eyes:  Negative for blurred vision.  Respiratory:  Positive for cough and sputum production. Negative for shortness of breath.   Cardiovascular:  Negative for chest pain, palpitations and leg swelling.  Gastrointestinal:  Negative for abdominal pain, blood in stool and nausea.  Genitourinary:  Negative for dysuria and frequency.  Musculoskeletal:  Negative for falls.  Skin:  Negative for rash.  Neurological:  Negative for dizziness, loss of consciousness and headaches.  Endo/Heme/Allergies:  Negative for environmental allergies.  Psychiatric/Behavioral:  Negative for depression. The patient is not nervous/anxious.        Objective:     BP 138/68 (BP Location: Right Arm, Patient Position: Sitting, Cuff Size: Large)   Pulse 95   Temp 97.9 F (36.6 C) (Oral)   Resp 18   Ht 5\' 7"  (1.702 m)   Wt 198 lb 9.6 oz (90.1 kg)   SpO2 97%   BMI 31.11 kg/m  BP Readings from Last 3 Encounters:  08/05/23 138/68  07/18/23 120/76  06/20/23 132/65   Wt Readings from Last 3 Encounters:  08/05/23 198 lb 9.6 oz (90.1 kg)  07/18/23 205 lb (93 kg)  06/20/23 208 lb (94.3 kg)   SpO2 Readings from Last 3 Encounters:  08/05/23 97%  06/20/23 97%  05/23/23 95%      Physical Exam Vitals and nursing note reviewed.  Constitutional:      General: She is not in acute distress.  Appearance: Normal appearance. She is well-developed.  HENT:     Head: Normocephalic and atraumatic.     Nose: Congestion present.     Right Sinus: Maxillary sinus tenderness and frontal sinus tenderness present.     Left Sinus: Maxillary sinus tenderness and frontal sinus tenderness present.  Eyes:     General: No scleral icterus.       Right eye: No discharge.        Left eye: No discharge.  Cardiovascular:     Rate and Rhythm: Normal rate and regular rhythm.     Heart sounds: No murmur heard. Pulmonary:     Effort: Pulmonary effort is normal. No respiratory distress.     Breath sounds: Normal breath sounds.  Musculoskeletal:        General: Normal range of motion.     Cervical back: Normal range of motion and neck supple.     Right lower leg: No edema.     Left lower leg: No edema.  Skin:    General: Skin is warm and dry.  Neurological:     Mental Status: She is alert and oriented to person, place, and time.  Psychiatric:        Mood and Affect: Mood normal.        Behavior: Behavior normal.        Thought Content: Thought content normal.        Judgment: Judgment normal.      No results found for any visits on 08/05/23.  Last CBC Lab Results  Component Value Date   WBC 10.4 02/16/2023   HGB 11.8 02/16/2023   HCT  36.7 02/16/2023   MCV 91 02/16/2023   MCH 29.4 02/16/2023   RDW 12.9 02/16/2023   PLT 464 (H) 02/16/2023   Last metabolic panel Lab Results  Component Value Date   GLUCOSE 133 (H) 04/13/2023   NA 136 04/13/2023   K 5.3 (H) 04/13/2023   CL 100 04/13/2023   CO2 23 04/13/2023   BUN 19 04/13/2023   CREATININE 1.26 (H) 04/13/2023   EGFR 47 (L) 04/13/2023   CALCIUM 9.0 04/13/2023   PROT 7.6 12/06/2022   ALBUMIN 3.7 04/04/2023   LABGLOB 3.6 12/06/2022   AGRATIO 1.0 (L) 11/03/2020   BILITOT <0.2 12/06/2022   ALKPHOS 100 12/06/2022   AST 17 12/06/2022   ALT 11 12/06/2022   ANIONGAP 5 03/08/2022   Last lipids Lab Results  Component Value Date   CHOL 130 12/06/2022   HDL 58 12/06/2022   LDLCALC 53 12/06/2022   TRIG 106 12/06/2022   CHOLHDL 2.2 12/06/2022   Last hemoglobin A1c Lab Results  Component Value Date   HGBA1C 7.1 (A) 04/13/2023   Last thyroid functions Lab Results  Component Value Date   TSH 2.350 02/16/2023   Last vitamin D Lab Results  Component Value Date   VD25OH 29.08 (L) 04/04/2023   Last vitamin B12 and Folate Lab Results  Component Value Date   VITAMINB12 981 05/23/2023   FOLATE 10.4 11/18/2020      The 10-year ASCVD risk score (Arnett DK, et al., 2019) is: 18.2%    Assessment & Plan:   Problem List Items Addressed This Visit   None Visit Diagnoses       Acute non-recurrent pansinusitis    -  Primary   Relevant Medications   amoxicillin-clavulanate (AUGMENTIN) 875-125 MG tablet   benzonatate (TESSALON PERLES) 100 MG capsule     Flu-like symptoms  Relevant Orders   POC COVID-19   POC Influenza A&B (Binax test)     Acute cough       Relevant Medications   benzonatate (TESSALON PERLES) 100 MG capsule     Assessment and Plan    Sinusitis and Bronchitis A two-week history of cough, headaches, and green sputum suggests bacterial sinusitis and bronchitis. COVID-19 and influenza tests are negative, and no fever is reported.  Antibiotics are prescribed due to the presence of green sputum. Flonase is recommended for nasal congestion, and Tessalon Perles is prescribed for cough management both day and night.  Diabetes Mellitus Diabetes mellitus is usually well-controlled. Emphasized the importance of regular blood sugar monitoring.  Follow-up if symptoms persist or worsen.        No follow-ups on file.    Donato Schultz, DO

## 2023-08-05 NOTE — Telephone Encounter (Signed)
 Noted

## 2023-08-09 NOTE — Telephone Encounter (Signed)
 LMx1 through [employer] to call office to schedule next Prolia injection.

## 2023-08-15 ENCOUNTER — Encounter: Payer: Self-pay | Admitting: Urology

## 2023-08-15 ENCOUNTER — Ambulatory Visit: Payer: Medicare Other | Admitting: Urology

## 2023-08-15 VITALS — BP 112/75 | HR 105 | Ht 66.93 in | Wt 196.0 lb

## 2023-08-15 DIAGNOSIS — R310 Gross hematuria: Secondary | ICD-10-CM

## 2023-08-15 DIAGNOSIS — R32 Unspecified urinary incontinence: Secondary | ICD-10-CM | POA: Diagnosis not present

## 2023-08-15 LAB — URINALYSIS
Bilirubin, UA: NEGATIVE
Ketones, UA: NEGATIVE
Leukocytes,UA: NEGATIVE
Nitrite, UA: NEGATIVE
Protein,UA: NEGATIVE
RBC, UA: NEGATIVE
Specific Gravity, UA: 1.01 (ref 1.005–1.030)
Urobilinogen, Ur: 0.2 mg/dL (ref 0.2–1.0)
pH, UA: 5.5 (ref 5.0–7.5)

## 2023-08-15 NOTE — Progress Notes (Signed)
 Assessment: 1. Urinary incontinence in female   2. Gross hematuria     Plan: Her evaluation for gross hematuria was negative with CT imaging, and cystoscopy.  All UAs have been negative for blood. Recommend further evaluation for possible GI source of the bleeding. Continue solifenacin 10 mg daily.   Return to office in 3 months. Chief Complaint:  Chief Complaint  Patient presents with   Hematuria    History of Present Illness:  Chelsea Coleman is a 68 y.o. female who is seen for further evaluation of hematuria and urinary incontinence.   At her initial visit in 12/24, she reported noting blood on her underwear or pad for approximately 2 months.  This was occurring daily.  She had not seen blood in her urine.  She denied any urinary incontinence.  No dysuria or flank pain.  No history of UTI's or kidney stones.  U/A 10/24:  0 RBC, 0-5 WBC U/A 12/24:  negative blood  CT abdomen/pelvis 05/07/23: No renal or ureteral calculi, no renal masses, no evidence of obstruction or filling defect.  No history of tobacco use. She has previously been seen by gynecology and no abnormality was noted on pelvic examination.  She continued to notice blood on her underwear and with wiping.  No dysuria or gross hematuria.   She reported increased problems with incontinence, leaking both daytime and nighttime.  No urgency or awareness of incontinence. She was taking oxybutynin XL 5 mg nightly. Cystoscopy from 07/18/2023 showed no urethral or bladder abnormalities. She was given a trial of Solifenacin 10 mg daily in place of the oxybutynin for management of her incontinence.  She returns today for follow-up.  She has not had any episodes of gross hematuria since her last visit.  She did have an episode of blood from the rectum approximately 3 weeks ago.  She continues on Solifenacin.  She feels like this is improving her incontinence.  No side effects.  No dysuria.  An interpreter was  used throughout the visit.  Past Medical History:  Past Medical History:  Diagnosis Date   Cataract    states removed in 2014   Chronic kidney disease 02/14/2020   Stage 3    Diabetes mellitus without complication (HCC)    GERD (gastroesophageal reflux disease)    Hypertension     Past Surgical History:  Past Surgical History:  Procedure Laterality Date   ABDOMINAL HYSTERECTOMY     fibroid   CHOLECYSTECTOMY     INCONTINENCE SURGERY     REFRACTIVE SURGERY  2017   BL    Allergies:  No Known Allergies  Family History:  Family History  Problem Relation Age of Onset   Diabetes Mother    Colon cancer Neg Hx    Colon polyps Neg Hx    Esophageal cancer Neg Hx    Rectal cancer Neg Hx    Stomach cancer Neg Hx     Social History:  Social History   Tobacco Use   Smoking status: Never   Smokeless tobacco: Never  Vaping Use   Vaping status: Never Used  Substance Use Topics   Alcohol use: Never   Drug use: Never    ROS: Constitutional:  Negative for fever, chills, weight loss CV: Negative for chest pain, previous MI, hypertension Respiratory:  Negative for shortness of breath, wheezing, sleep apnea, frequent cough GI:  Negative for nausea, vomiting, bloody stool, GERD  Physical exam: BP 112/75   Pulse (!) 105  Ht 5' 6.93" (1.7 m)   Wt 196 lb (88.9 kg)   BMI 30.76 kg/m  GENERAL APPEARANCE:  Well appearing, well developed, well nourished, NAD HEENT:  Atraumatic, normocephalic, oropharynx clear NECK:  Supple without lymphadenopathy or thyromegaly ABDOMEN:  Soft, non-tender, no masses EXTREMITIES:  Moves all extremities well, without clubbing, cyanosis, or edema NEUROLOGIC:  Alert and oriented x 3, normal gait, CN II-XII grossly intact MENTAL STATUS:  appropriate BACK:  Non-tender to palpation, No CVAT SKIN:  Warm, dry, and intact  Results: U/A: 1+ glucose

## 2023-08-22 ENCOUNTER — Other Ambulatory Visit: Payer: Self-pay

## 2023-08-22 MED ORDER — IBUPROFEN 800 MG PO TABS
ORAL_TABLET | ORAL | 0 refills | Status: DC
  Filled 2023-08-22: qty 18, 5d supply, fill #0

## 2023-08-22 MED ORDER — AMOXICILLIN 500 MG PO CAPS
500.0000 mg | ORAL_CAPSULE | Freq: Three times a day (TID) | ORAL | 0 refills | Status: DC
  Filled 2023-08-22: qty 18, 6d supply, fill #0

## 2023-08-22 MED ORDER — TRAMADOL HCL 50 MG PO TABS
50.0000 mg | ORAL_TABLET | Freq: Four times a day (QID) | ORAL | 0 refills | Status: DC | PRN
  Filled 2023-08-22: qty 10, 3d supply, fill #0

## 2023-08-23 ENCOUNTER — Ambulatory Visit: Payer: Medicare Other

## 2023-08-23 VITALS — BP 122/70 | HR 80 | Ht 66.93 in | Wt 206.0 lb

## 2023-08-23 DIAGNOSIS — M810 Age-related osteoporosis without current pathological fracture: Secondary | ICD-10-CM

## 2023-08-23 MED ORDER — DENOSUMAB 60 MG/ML ~~LOC~~ SOSY
60.0000 mg | PREFILLED_SYRINGE | SUBCUTANEOUS | Status: AC
  Administered 2023-08-23: 60 mg via SUBCUTANEOUS

## 2023-08-23 MED ORDER — DENOSUMAB 60 MG/ML ~~LOC~~ SOSY
60.0000 mg | PREFILLED_SYRINGE | SUBCUTANEOUS | Status: AC
  Administered 2024-02-28: 60 mg via SUBCUTANEOUS

## 2023-08-23 NOTE — Progress Notes (Signed)
 After obtaining consent, and per orders of Dr. Lonzo Cloud, injection of Prolia 60mg  given by Jakobe Blau L Berenice Oehlert in left arm. Patient instructed to remain in clinic for 20 minutes afterwards, and to report any adverse reaction to me immediately.

## 2023-08-24 NOTE — Telephone Encounter (Signed)
 Last Prolia inj 08/23/23 Next Prolia inj due 02/24/24

## 2023-08-29 ENCOUNTER — Other Ambulatory Visit: Payer: Self-pay

## 2023-08-30 ENCOUNTER — Other Ambulatory Visit: Payer: Self-pay | Admitting: Pharmacist

## 2023-08-30 ENCOUNTER — Other Ambulatory Visit: Payer: Self-pay

## 2023-08-30 DIAGNOSIS — Z794 Long term (current) use of insulin: Secondary | ICD-10-CM

## 2023-08-30 DIAGNOSIS — E1165 Type 2 diabetes mellitus with hyperglycemia: Secondary | ICD-10-CM

## 2023-08-30 MED ORDER — HUMULIN 70/30 (70-30) 100 UNIT/ML ~~LOC~~ SUSP: SUBCUTANEOUS | 1 refills | Status: DC

## 2023-09-02 ENCOUNTER — Other Ambulatory Visit: Payer: Self-pay

## 2023-09-07 ENCOUNTER — Other Ambulatory Visit: Payer: Self-pay | Admitting: Physician Assistant

## 2023-09-07 ENCOUNTER — Other Ambulatory Visit: Payer: Self-pay

## 2023-09-07 ENCOUNTER — Encounter: Payer: Self-pay | Admitting: Internal Medicine

## 2023-09-07 ENCOUNTER — Telehealth: Payer: Self-pay

## 2023-09-07 DIAGNOSIS — Z794 Long term (current) use of insulin: Secondary | ICD-10-CM

## 2023-09-07 DIAGNOSIS — E1165 Type 2 diabetes mellitus with hyperglycemia: Secondary | ICD-10-CM

## 2023-09-07 NOTE — Telephone Encounter (Signed)
 I do not see any message attached to this.  What is the issue that I need to advise on?

## 2023-09-07 NOTE — Telephone Encounter (Signed)
**Note De-identified  [place] Obfuscation** Please advise 

## 2023-09-08 ENCOUNTER — Telehealth: Payer: Self-pay

## 2023-09-08 ENCOUNTER — Other Ambulatory Visit: Payer: Self-pay

## 2023-09-08 NOTE — Telephone Encounter (Signed)
 Copied from CRM [id number]. Topic: Clinical - Medication Question >> Sep 07, 2023 12:15 PM Yolanda T wrote: Reason for CRM: patient stated she was advised by the pharmacy to call and request the correct insulin as she was prescribed insulin NPH-regular Human (HUMULIN 70/30) (70-30) 100 UNIT/ML injection and she need the insulin in the pen. Patient is requesting that provider send the script for the insulin in the pen.

## 2023-09-09 ENCOUNTER — Other Ambulatory Visit: Payer: Self-pay

## 2023-09-09 MED ORDER — TRUEPLUS PEN NEEDLES 32G X 4 MM MISC
2 refills | Status: DC
  Filled 2023-09-09: qty 100, 50d supply, fill #0
  Filled 2023-11-27: qty 100, 50d supply, fill #1
  Filled 2024-02-06: qty 100, 50d supply, fill #2

## 2023-09-09 MED ORDER — HUMULIN 70/30 KWIKPEN (70-30) 100 UNIT/ML ~~LOC~~ SUPN
PEN_INJECTOR | SUBCUTANEOUS | 11 refills | Status: DC
  Filled 2023-09-09: qty 15, 42d supply, fill #0
  Filled 2023-10-21: qty 15, 42d supply, fill #1
  Filled 2023-11-27: qty 15, 42d supply, fill #2
  Filled 2024-02-06: qty 15, 42d supply, fill #3

## 2023-09-09 NOTE — Telephone Encounter (Signed)
 Call placed to patient unable to reach message left on VM.  If patient return's call please  advice her that insulin pens and needles have been sent to her pharmacy.

## 2023-09-09 NOTE — Addendum Note (Signed)
 Addended by: Jonah Blue B on: 09/09/2023 01:57 PM   Modules accepted: Orders

## 2023-09-09 NOTE — Telephone Encounter (Signed)
 Prescription sent for insulin pen.  I also sent prescription for the needles.  Prescription sent to our pharmacy.

## 2023-09-12 ENCOUNTER — Other Ambulatory Visit: Payer: Self-pay

## 2023-09-15 ENCOUNTER — Other Ambulatory Visit: Payer: Self-pay

## 2023-09-19 ENCOUNTER — Ambulatory Visit: Payer: Medicare Other | Admitting: Physician Assistant

## 2023-09-19 ENCOUNTER — Encounter: Payer: Self-pay | Admitting: Physician Assistant

## 2023-09-19 VITALS — BP 105/86 | HR 78 | Resp 18 | Ht 66.0 in

## 2023-09-19 DIAGNOSIS — R413 Other amnesia: Secondary | ICD-10-CM | POA: Diagnosis not present

## 2023-09-19 NOTE — Patient Instructions (Addendum)
 It was a pleasure to see you today at our office.   Recommendations:    MRI of the brain, the radiology office will call you to arrange you appointment   Follow up in 6   months Cuidese de la diabetes   Use el CPAP T[place] donepezil 5 mg al dia.  Cuide sus globulos rojos y blancos    For psychiatric meds, mood meds: Please have your primary care physician manage these medications.  If you have any severe symptoms of a stroke, or other severe issues such as confusion,severe chills or fever, etc call 911 or go to the ER as you may need to be evaluated further     For assessment of decision of mental capacity and competency:  Call Dr. Erick Blinks, geriatric psychiatrist at [phone]  Counseling regarding caregiver distress, including caregiver depression, anxiety and issues regarding community resources, adult day care programs, adult living facilities, or memory care questions:  please contact your  Primary Doctor's Social Worker   Whom to call: Memory  decline, memory medications: Call our office [phone]    [email or web address]   RECOMMENDATIONS FOR ALL PATIENTS WITH MEMORY PROBLEMS: 1. Continue to exercise (Recommend 30 minutes of walking everyday, or 3 hours every week) 2. Increase social interactions - continue going to [place] and enjoy social gatherings with friends and family 3. Eat healthy, avoid fried foods and eat more fruits and vegetables 4. Maintain adequate blood pressure, blood sugar, and blood cholesterol level. Reducing the risk of stroke and cardiovascular disease also helps promoting better memory. 5. Avoid stressful situations. Live a simple life and avoid aggravations. Organize your time and prepare for the next day in anticipation. 6. Sleep well, avoid any interruptions of sleep and avoid any distractions in the bedroom that may interfere with adequate sleep quality 7. Avoid sugar, avoid sweets as  there is a strong link between excessive sugar intake, diabetes, and cognitive impairment We discussed the Mediterranean diet, which has been shown to help patients reduce the risk of progressive memory disorders and reduces cardiovascular risk. This includes eating fish, eat fruits and green leafy vegetables, nuts like almonds and hazelnuts, walnuts, and also use olive oil. Avoid fast foods and fried foods as much as possible. Avoid sweets and sugar as sugar use has been linked to worsening of memory function.  There is always a concern of gradual progression of memory problems. If this is the case, then we may need to adjust level of care according to patient needs. Support, both to the patient and caregiver, should then be put into place.         FALL PRECAUTIONS: Be cautious when walking. Scan the area for obstacles that may increase the risk of trips and falls. When getting up in the mornings, sit up at the edge of the bed for a few minutes before getting out of bed. Consider elevating the bed at the head end to avoid drop of blood pressure when getting up. Walk always in a well-lit room (use night lights in the walls). Avoid area rugs or power cords from appliances in the middle of the walkways. Use a walker or a cane if necessary and consider physical therapy for balance exercise. Get your eyesight checked regularly.  FINANCIAL OVERSIGHT: Supervision, especially oversight when making financial decisions or transactions is also recommended.  HOME SAFETY: Consider the safety of the kitchen when operating appliances like stoves, microwave oven, and blender. Consider having supervision and share cooking responsibilities until no longer  able to participate in those. Accidents with firearms and other hazards in the house should be identified and addressed as well.   ABILITY TO BE LEFT ALONE: If patient is unable to contact 911 operator, consider using LifeLine, or when the need is there, arrange for  someone to stay with patients. Smoking is a fire hazard, consider supervision or cessation. Risk of wandering should be assessed by caregiver and if detected at any point, supervision and safe proof recommendations should be instituted.  MEDICATION SUPERVISION: Inability to self-administer medication needs to be constantly addressed. Implement a mechanism to ensure safe administration of the medications.      Mediterranean Diet A Mediterranean diet refers to food and lifestyle choices that are based on the traditions of countries located on the [employer] [place]. This way of eating has been shown to help prevent certain conditions and improve outcomes for people who have chronic diseases, like kidney disease and heart disease. What are tips for following this plan? Lifestyle  Cook and eat meals together with your family, when possible. Drink enough fluid to keep your urine clear or pale yellow. Be physically active every day. This includes: Aerobic exercise like running or swimming. Leisure activities like gardening, walking, or housework. Get 7-8 hours of sleep each night. If recommended by your health care provider, drink red wine in moderation. This means 1 glass a day for nonpregnant women and 2 glasses a day for men. A glass of wine equals 5 oz (150 mL). Reading food labels  Check the serving size of packaged foods. For foods such as rice and pasta, the serving size refers to the amount of cooked product, not dry. Check the total fat in packaged foods. Avoid foods that have saturated fat or trans fats. Check the ingredients list for added sugars, such as corn syrup. Shopping  At the grocery store, buy most of your food from the areas near the walls of the store. This includes: Fresh fruits and vegetables (produce). Grains, beans, nuts, and seeds. Some of these may be available in unpackaged forms or large amounts (in bulk). Fresh seafood. Poultry and eggs. Low-fat dairy  products. Buy whole ingredients instead of prepackaged foods. Buy fresh fruits and vegetables in-season from local farmers markets. Buy frozen fruits and vegetables in resealable bags. If you do not have access to quality fresh seafood, buy precooked frozen shrimp or canned fish, such as tuna, salmon, or sardines. Buy small amounts of raw or cooked vegetables, salads, or olives from the deli or salad bar at your store. Stock your pantry so you always have certain foods on hand, such as olive oil, canned tuna, canned tomatoes, rice, pasta, and beans. Cooking  Cook foods with extra-virgin olive oil instead of using butter or other vegetable oils. Have meat as a side dish, and have vegetables or grains as your main dish. This means having meat in small portions or adding small amounts of meat to foods like pasta or stew. Use beans or vegetables instead of meat in common dishes like chili or lasagna. Experiment with different cooking methods. Try roasting or broiling vegetables instead of steaming or sauteing them. Add frozen vegetables to soups, stews, pasta, or rice. Add nuts or seeds for added healthy fat at each meal. You can add these to yogurt, salads, or vegetable dishes. Marinate fish or vegetables using olive oil, lemon juice, garlic, and fresh herbs. Meal planning  Plan to eat 1 vegetarian meal one day each week. Try to work up to  2 vegetarian meals, if possible. Eat seafood 2 or more times a week. Have healthy snacks readily available, such as: Vegetable sticks with hummus. Greek yogurt. Fruit and nut trail mix. Eat balanced meals throughout the week. This includes: Fruit: 2-3 servings a day Vegetables: 4-5 servings a day Low-fat dairy: 2 servings a day Fish, poultry, or lean meat: 1 serving a day Beans and legumes: 2 or more servings a week Nuts and seeds: 1-2 servings a day Whole grains: 6-8 servings a day Extra-virgin olive oil: 3-4 servings a day Limit red meat and sweets  to only a few servings a month What are my food choices? Mediterranean diet Recommended Grains: Whole-grain pasta. Brown rice. Bulgar wheat. Polenta. Couscous. Whole-wheat bread. Orpah Cobb. Vegetables: Artichokes. Beets. Broccoli. Cabbage. Carrots. Eggplant. Green beans. Chard. Kale. Spinach. Onions. Leeks. Peas. Squash. Tomatoes. Peppers. Radishes. Fruits: Apples. Apricots. Avocado. Berries. Bananas. Cherries. Dates. Figs. Grapes. Lemons. Melon. Oranges. Peaches. Plums. Pomegranate. Meats and other protein foods: Beans. Almonds. Sunflower seeds. Pine nuts. Peanuts. Cod. Salmon. Scallops. Shrimp. Tuna. Tilapia. Clams. Oysters. Eggs. Dairy: Low-fat milk. Cheese. Greek yogurt. Beverages: Water. Red wine. Herbal tea. Fats and oils: Extra virgin olive oil. Avocado oil. Grape seed oil. Sweets and desserts: [place] yogurt with honey. Baked apples. Poached pears. Trail mix. Seasoning and other foods: Basil. Cilantro. Coriander. Cumin. Mint. Parsley. Sage. Rosemary. Tarragon. Garlic. Oregano. Thyme. Pepper. Balsalmic vinegar. Tahini. Hummus. Tomato sauce. Olives. Mushrooms. Limit these Grains: Prepackaged pasta or rice dishes. Prepackaged cereal with added sugar. Vegetables: Deep fried potatoes (french fries). Fruits: Fruit canned in syrup. Meats and other protein foods: Beef. Pork. Lamb. Poultry with skin. Hot dogs. Tomasa Blase. Dairy: Ice cream. Sour cream. Whole milk. Beverages: Juice. Sugar-sweetened soft drinks. Beer. Liquor and spirits. Fats and oils: Butter. Canola oil. Vegetable oil. Beef fat (tallow). Lard. Sweets and desserts: Cookies. Cakes. Pies. Candy. Seasoning and other foods: Mayonnaise. Premade sauces and marinades. The items listed may not be a complete list. Talk with your dietitian about what dietary choices are right for you. Summary The Mediterranean diet includes both food and lifestyle choices. Eat a variety of fresh fruits and vegetables, beans, nuts, seeds, and whole  grains. Limit the amount of red meat and sweets that you eat. Talk with your health care provider about whether it is safe for you to drink red wine in moderation. This means 1 glass a day for nonpregnant women and 2 glasses a day for men. A glass of wine equals 5 oz (150 mL). This information is not intended to replace advice given to you by your health care provider. Make sure you discuss any questions you have with your health care provider. Document Released: 01/22/2016 Document Revised: 02/24/2016 Document Reviewed: 01/22/2016 Elsevier Interactive Patient Education  2017 [employer].

## 2023-09-19 NOTE — Progress Notes (Signed)
 Assessment/Plan:   Memory Impairment of unclear etiology, likely multifactorial  Chelsea Coleman is a very pleasant 68 y.o. 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 Patient is on donepezil 5 mg daily. Memory is stable. Patient is able to participate on ADLs without difficulties.  Mood is good.      Recommendations:   Follow up in 6  months. Continue donepezil 5 mg daily, side effects discussed. Recommend proceeding with MRI of the brain for further evaluation of any structural abnormalities and vascular load Continue vitamin D replenishment Follow thrombocytosis leukocytosis with oncology Recommend using the CPAP on a regular basis and follow-up with pulmonology Follow-up retinal detachment with ophthalmology Recommend good control of cardiovascular risk factors Continue to control mood as per PCP    Subjective:   This patient is here alone.who supplements the history. Previous records as well as any outside records available were reviewed prior to todays visit. Patient was last seen on 06/2023 with [facility] 23/30    Any changes in memory since last visit? "  About the same ".  She denies having any issues with memory.  LTM is good. repeats oneself?  Endorsed Disoriented when walking into a room?  Patient denies   Misplacing objects?  Patient denies.  If something is not found she says that is her husband who moves them  Wandering behavior?   denies   Any personality changes since last visit?  She  has moments of irritability. Any worsening depression?: denies. Hallucinations or paranoia?  denies   Seizures?   denies    Any sleep changes? Sleeps  well unless she has to get up to go to the bathroom at night.  Denies vivid dreams, REM behavior or sleepwalking   Sleep apnea?  Endorsed, she is noncompliant with her CPAP.  Any hygiene concerns?   Denies.   Independent of bathing and dressing?  Endorsed  Does the patient needs help with medications? Patient is in charge   Who is in charge of the finances?  Husband is in charge     Any changes in appetite?  Denies.     Patient have trouble swallowing?  Denies.   Does the patient cook?  No, her husband is always on the cooking, he is in [employer] business. Any headaches?   She has a history of migraine headaches with aura, takes Topamax as needed.  These are not frequent, and she denies any recent events. Vision changes?  She has a history of retinal detachment for 15 years, she sees ophthalmology Chronic pain?  She has a history of DDD of the lumbar spine, takes muscle relaxers and gabapentin. Ambulates with difficulty?  She has DJD, as well as diabetic neuropathy, which may limit her mobility at times.  Recent falls or head injuries?    denies      Unilateral weakness or unilateral numbness?  Denies Any tremors?  Denies.   Any anosmia?    Denies.   Any incontinence of urine?  Endorsed, she sees GU Any bowel dysfunction?  She has chronic constipation, sees GI due to recent GIB Patient lives with her husband    Does the patient drive?  She does not drive due to vision issues  Past Medical History:  Diagnosis Date   Cataract    states removed in 2014   Chronic kidney disease 02/14/2020   Stage 3    Diabetes mellitus without complication (HCC)    GERD (gastroesophageal reflux disease)    Hypertension      Past Surgical History:  Procedure Laterality Date   ABDOMINAL HYSTERECTOMY     fibroid   CHOLECYSTECTOMY     INCONTINENCE SURGERY     REFRACTIVE SURGERY  2017   BL     PREVIOUS MEDICATIONS:   CURRENT MEDICATIONS:  Outpatient Encounter Medications as of 09/19/2023   Medication Sig   Accu-Chek FastClix Lancets MISC Use to check blood sugar 3 TIMES DAILY. (Patient taking differently: Use to check blood sugar 3 TIMES DAILY.)   amLODipine (NORVASC) 5 MG tablet Take 1.5 tablets (7.5 mg total) by mouth daily. (Patient not taking: Reported on 08/15/2023)   amoxicillin (AMOXIL) 500 MG capsule Take 1 capsule (500 mg total) by mouth 3 (three) times daily.   amoxicillin-clavulanate (AUGMENTIN) 875-125 MG tablet Take 1 tablet by mouth 2 (two) times daily.   aspirin EC 81 MG tablet Take 1 tablet (81 mg total) by mouth daily. Swallow whole. (Patient not taking: Reported on 08/15/2023)   atorvastatin (LIPITOR) 20 MG tablet Take 1 tablet (20 mg total) by mouth daily. (Patient not taking: Reported on 08/15/2023)   benzonatate (TESSALON PERLES) 100 MG capsule Take 2 capsules (200 mg total) by mouth 3 (three) times daily as needed for cough. (Patient not taking: Reported on 08/15/2023)   Blood Glucose Monitoring Suppl (ACCU-CHEK GUIDE ME) w/Device KIT Use to check blood sugar 3 TIMES DAILY. (Patient taking differently: Use to check blood sugar 3 TIMES DAILY.)   clobetasol (TEMOVATE) 0.05 % external solution APPLY SMALL AMOUNT TO AFFECTED AREA 3 TIMES A WEEK AS NEEDED. (Patient not taking: Reported on 08/15/2023)   Continuous Blood Gluc Receiver (FREESTYLE LIBRE READER) DEVI use as directed   Continuous Blood Gluc Sensor (FREESTYLE LIBRE SENSOR SYSTEM) MISC Change sensor every 2 weeks (14 days)   dapagliflozin propanediol (FARXIGA) 5 MG TABS tablet Take 1 tablet (5 mg total) by mouth daily before breakfast.   dapagliflozin propanediol (FARXIGA) 5 MG TABS tablet Take 1 tablet (5 mg total) by mouth daily.   donepezil (ARICEPT) 5 MG tablet Take 1 tablet (5 mg total) by mouth daily. (Patient not taking: Reported on 08/15/2023)   ergocalciferol (VITAMIN D2) 1.25 MG (50000 UT) capsule Take 1 capsule (50,000 Units total) by mouth once a week.   fluticasone (FLONASE) 50 MCG/ACT nasal spray Place 1  spray into both nostrils daily.   gabapentin (NEURONTIN) 400 MG capsule Take 1 capsule (400 mg total) by mouth 2 (two) times daily.   glucose blood (ACCU-CHEK GUIDE) test strip Use to check blood sugar 3 TIMES DAILY. (Patient taking differently: Use to check blood sugar 3 TIMES DAILY.)   ibuprofen (ADVIL) 800 MG tablet Take 1 tablet by mouth every six to eight hours as needed for pain   insulin isophane & regular human KwikPen (HUMULIN 70/30 KWIKPEN) (70-30) 100 UNIT/ML KwikPen Inject 20-23 units subcutaneously with breakfast and 12 units with dinner.   Insulin Pen Needle (TRUEPLUS PEN NEEDLES) 32G X 4 MM MISC Use to inject insulin twice daily.   Insulin Syringe-Needle U-100 (TRUEPLUS INSULIN SYRINGE) 31G X 5/16" 0.3 ML MISC Use to inject insulin twice daily   loratadine (CLARITIN) 10 MG tablet Take 1 tablet (10 mg total) by mouth daily as needed for  allergies.   losartan (COZAAR) 50 MG tablet Take 1 tablet (50 mg total) by mouth daily.   methocarbamol (ROBAXIN) 500 MG tablet Take 1 tablet (500 mg total) by mouth daily as needed (Medication can cause drowsiness).   omeprazole (PRILOSEC) 20 MG capsule TAKE 1 CAPSULE (20 MG TOTAL) BY MOUTH DAILY.   OVER THE COUNTER MEDICATION Take 1 capsule by mouth at bedtime.   solifenacin (VESICARE) 10 MG tablet Take 1 tablet (10 mg total) by mouth daily.   topiramate (TOPAMAX) 25 MG tablet Take 1 tablet (25 mg total) by mouth at bedtime as needed.   traMADol (ULTRAM) 50 MG tablet Take 1 tablet (50 mg total) by mouth every 6 (six) hours as needed.   Facility-Administered Encounter Medications as of 09/19/2023  Medication   [START ON 02/19/2024] denosumab (PROLIA) injection 60 mg     Objective:     PHYSICAL EXAMINATION:    VITALS:   Vitals:   09/19/23 0846  Height: 5\' 6"  (1.676 m)    GEN:  The patient appears stated age and is in NAD. HEENT:  Normocephalic, atraumatic.   Neurological examination:  General: NAD, well-groomed, appears stated  age. Orientation: The patient is alert. Oriented to person, place and date Cranial nerves: There is good facial symmetry.The speech is fluent and clear. No aphasia or dysarthria. Fund of knowledge is appropriate. Recent memory impaired and remote memory is normal.  Attention and concentration are normal.  Able to name objects and repeat phrases.  Hearing is intact to conversational tone.     Sensation: Sensation is intact to light touch throughout Motor: Strength is at least antigravity x4. DTR's 2/4 in UE/LE      06/20/2023   10:00 AM  Montreal Cognitive Assessment   Visuospatial/ Executive (0/5) 2  Naming (0/3) 3  Attention: Read list of digits (0/2) 1  Attention: Read list of letters (0/1) 1  Attention: Serial 7 subtraction starting at 100 (0/3) 3  Language: Repeat phrase (0/2) 1  Language : Fluency (0/1) 0  Abstraction (0/2) 1  Delayed Recall (0/5) 4  Orientation (0/6) 6  Total 22  Adjusted Score (based on education) 23       08/06/2022    9:08 AM 02/23/2021    3:05 PM 02/04/2020    9:11 AM  MMSE - Mini Mental State Exam  Orientation to time 5 5 5   Orientation to Place 1 5 5   Registration 3 3 3   Attention/ Calculation 5 5 5   Recall 3 3 3   Language- name 2 objects 2 2 2   Language- repeat 1 1 1   Language- follow 3 step command 3 3 3   Language- read & follow direction 1 1 1   Write a sentence 1 1 1   Copy design 1 1 0  Total score 26 30 29        Movement examination: Tone: There is normal tone in the UE/LE Abnormal movements:  no tremor.  No myoclonus.  No asterixis.   Coordination:  There is no decremation with RAM's. Normal finger to nose  Gait and Station: The patient has no difficulty arising out of a deep-seated chair without the use of the hands. The patient's stride length is good.  Gait is cautious and narrow.   Thank you for allowing [place] the opportunity to participate in the care of this nice patient. Please do not hesitate to contact us for any questions or  concerns.   Total time spent on today's visit was 30 minutes dedicated  to this patient today, preparing to see patient, examining the patient, ordering tests and/or medications and counseling the patient, documenting clinical information in the EHR or other health record, independently interpreting results and communicating results to the patient/family, discussing treatment and goals, answering patient's questions and coordinating care.  Cc:  Marcine Matar, MD  Marlowe Kays 09/19/2023 9:18 AM

## 2023-09-26 ENCOUNTER — Ambulatory Visit: Payer: Medicare Other | Attending: Internal Medicine | Admitting: Internal Medicine

## 2023-09-26 ENCOUNTER — Other Ambulatory Visit: Payer: Self-pay

## 2023-09-26 ENCOUNTER — Encounter: Payer: Self-pay | Admitting: Internal Medicine

## 2023-09-26 DIAGNOSIS — Z7984 Long term (current) use of oral hypoglycemic drugs: Secondary | ICD-10-CM

## 2023-09-26 DIAGNOSIS — M810 Age-related osteoporosis without current pathological fracture: Secondary | ICD-10-CM

## 2023-09-26 DIAGNOSIS — E1142 Type 2 diabetes mellitus with diabetic polyneuropathy: Secondary | ICD-10-CM | POA: Diagnosis not present

## 2023-09-26 DIAGNOSIS — E1159 Type 2 diabetes mellitus with other circulatory complications: Secondary | ICD-10-CM

## 2023-09-26 DIAGNOSIS — E119 Type 2 diabetes mellitus without complications: Secondary | ICD-10-CM

## 2023-09-26 DIAGNOSIS — Z6833 Body mass index (BMI) 33.0-33.9, adult: Secondary | ICD-10-CM

## 2023-09-26 DIAGNOSIS — I129 Hypertensive chronic kidney disease with stage 1 through stage 4 chronic kidney disease, or unspecified chronic kidney disease: Secondary | ICD-10-CM | POA: Diagnosis not present

## 2023-09-26 DIAGNOSIS — E1169 Type 2 diabetes mellitus with other specified complication: Secondary | ICD-10-CM

## 2023-09-26 DIAGNOSIS — Z794 Long term (current) use of insulin: Secondary | ICD-10-CM

## 2023-09-26 DIAGNOSIS — N1831 Chronic kidney disease, stage 3a: Secondary | ICD-10-CM

## 2023-09-26 DIAGNOSIS — R413 Other amnesia: Secondary | ICD-10-CM

## 2023-09-26 LAB — POCT GLYCOSYLATED HEMOGLOBIN (HGB A1C): HbA1c, POC (controlled diabetic range): 7.3 % — AB (ref 0.0–7.0)

## 2023-09-26 LAB — GLUCOSE, POCT (MANUAL RESULT ENTRY): POC Glucose: 245 mg/dL — AB (ref 70–99)

## 2023-09-26 MED ORDER — FREESTYLE LIBRE 3 PLUS SENSOR MISC
6 refills | Status: AC
  Filled 2023-09-26: qty 2, 30d supply, fill #0
  Filled 2023-11-27: qty 2, 30d supply, fill #1
  Filled 2023-12-19 – 2023-12-20 (×2): qty 2, 30d supply, fill #2
  Filled 2024-02-06: qty 2, 30d supply, fill #3
  Filled 2024-03-11: qty 2, 30d supply, fill #4
  Filled 2024-05-07: qty 2, 30d supply, fill #5
  Filled 2024-06-04: qty 2, 30d supply, fill #6

## 2023-09-26 MED ORDER — FREESTYLE LIBRE 3 READER DEVI
0 refills | Status: AC
  Filled 2023-09-26: qty 1, 30d supply, fill #0

## 2023-09-26 NOTE — Progress Notes (Signed)
 Patient ID: Chelsea Coleman, female    DOB: 1955-09-21  MRN: [id number]  CC: Diabetes (DM & HTN f/u. Herold Harms form completion /Requesting sensors for [place])   Subjective: Chelsea Coleman is a 67 y.o. female who presents for chronic ds management. Her concerns today include:  Pt with hx of DM neuropathy and retinopathy, HTN, HL, obesity, anemia of chronic ds, thrombocytosis/leukocytosis (followed by hematology), CKD 3b, ACD, Vit D, osteoporosis (Dr. Brooks Sailors), secondary hyperparathyroidism, migraines, chronic BL infarct cerebellar hemisphere, mix urinary incont.    [employer] Language interpreter used during this encounter. #[id number] Coleman  DM:  Results for orders placed or performed in visit on 09/26/23  POCT glucose (manual entry)   Collection Time: 09/26/23 10:42 AM  Result Value Ref Range   POC Glucose 245 (A) 70 - 99 mg/dl  POCT glycosylated hemoglobin (Hb A1C)   Collection Time: 09/26/23 11:06 AM  Result Value Ref Range   Hemoglobin A1C     HbA1c POC (<> result, manual entry)     HbA1c, POC (prediabetic range)     HbA1c, POC (controlled diabetic range) 7.3 (A) 0.0 - 7.0 %  Not checking BS regularly Request CGM due to poor vision Doing okay with eating habits She should be on Farxiga 5 mg daily and NPH/regular insulin 20 units in a.m/10 units; taking consistently Wants DM shoes.  Has history of diabetic neuropathy. She brings a form with her from her insurance that wants me to verify that she has a qualifying chronic medical condition.  HTN/CKD 3a: thinks she has appt coming up with Nephrology. Not on NSAIDs Reports compliance with Cozaar 50 mg daily and Norvasc Spouse checks BP occasionally and reports good readings She limits salt in foods.  No CP/SOB  HL:  taking Lipitor 20 mg but only 1/2 tab because full dose causes cramps  Memory problems:  saw neurologist since last visit with me Last moCA 23/30.  Pt on Aricept which she feels helps.  Hematuria:  work up by urologist including, cystoscopy and imaging, was negative  Osteoporosis: saw Dr. Brooks Sailors 03/2023.  On Prolia inj Q 6 mths  Reports no issues with depression or anxiety at this time.  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 12/28/2021   Mixed stress and urge urinary incontinence 12/28/2021   Class 2 severe obesity with serious comorbidity and body mass index (BMI) of 35.0 to 35.9 in adult [facility]) 12/28/2021   Secondary hyperparathyroidism (HCC) 12/02/2021   Age related osteoporosis 11/30/2021   Osteoporosis of multiple sites 04/21/2021   Leukocytosis 11/18/2020   Thrombocytosis 11/18/2020   Hypertension associated with stage 3 chronic kidney disease due to type 2 diabetes mellitus (HCC) 11/03/2020   Obesity (BMI 35.0-39.9 without comorbidity) 11/03/2020   Hyperlipidemia associated with type 2 diabetes mellitus (HCC) 04/06/2019   Gastroesophageal reflux disease without esophagitis 04/06/2019   Type 2 diabetes, controlled, with peripheral neuropathy (HCC) 01/26/2019   Diabetic retinopathy associated with controlled type 2 diabetes mellitus (HCC) 01/26/2019   Essential hypertension 01/26/2019   Anemia 01/26/2019   Drug-induced constipation 01/26/2019   Memory impairment 01/26/2019   Positive depression screening 01/26/2019     Current Outpatient Medications on File Prior to Visit  Medication Sig Dispense Refill   amLODipine (NORVASC) 5 MG tablet Take 1.5 tablets (7.5 mg total) by mouth daily. 135 tablet  1   atorvastatin (LIPITOR) 20 MG tablet Take 1 tablet (20 mg total) by mouth daily. 90 tablet 3   ergocalciferol (VITAMIN D2) 1.25 MG (50000 UT) capsule Take 1 capsule (50,000 Units total) by mouth once a week. 4 capsule 1   fluticasone (FLONASE) 50 MCG/ACT nasal spray Place 1 spray into both nostrils daily.  16 g 0   gabapentin (NEURONTIN) 400 MG capsule Take 1 capsule (400 mg total) by mouth 2 (two) times daily. 180 capsule 3   glucose blood (ACCU-CHEK GUIDE) test strip Use to check blood sugar 3 TIMES DAILY. (Patient taking differently: Use to check blood sugar 3 TIMES DAILY.) 100 each 2   insulin isophane & regular human KwikPen (HUMULIN 70/30 KWIKPEN) (70-30) 100 UNIT/ML KwikPen Inject 20-23 units subcutaneously with breakfast and 12 units with dinner. 15 mL 11   Insulin Pen Needle (TRUEPLUS PEN NEEDLES) 32G X 4 MM MISC Use to inject insulin twice daily. 100 each 2   Insulin Syringe-Needle U-100 (TRUEPLUS INSULIN SYRINGE) 31G X 5/16" 0.3 ML MISC Use to inject insulin twice daily 100 each 2   losartan (COZAAR) 50 MG tablet Take 1 tablet (50 mg total) by mouth daily. 90 tablet 1   methocarbamol (ROBAXIN) 500 MG tablet Take 1 tablet (500 mg total) by mouth daily as needed (Medication can cause drowsiness). 30 tablet 1   solifenacin (VESICARE) 10 MG tablet Take 1 tablet (10 mg total) by mouth daily. 30 tablet 11   traMADol (ULTRAM) 50 MG tablet Take 1 tablet (50 mg total) by mouth every 6 (six) hours as needed. 10 tablet 0   Accu-Chek FastClix Lancets MISC Use to check blood sugar 3 TIMES DAILY. (Patient not taking: Reported on 09/26/2023) 100 each 2   aspirin EC 81 MG tablet Take 1 tablet (81 mg total) by mouth daily. Swallow whole. (Patient not taking: Reported on 08/15/2023) 100 tablet 1   Blood Glucose Monitoring Suppl (ACCU-CHEK GUIDE ME) w/Device KIT Use to check blood sugar 3 TIMES DAILY. (Patient not taking: Reported on 09/26/2023) 1 kit 0   clobetasol (TEMOVATE) 0.05 % external solution APPLY SMALL AMOUNT TO AFFECTED AREA 3 TIMES A WEEK AS NEEDED. (Patient not taking: Reported on 08/15/2023) 50 mL 1   dapagliflozin propanediol (FARXIGA) 5 MG TABS tablet Take 1 tablet (5 mg total) by mouth daily. (Patient not taking: Reported on 09/26/2023) 90 tablet 1   donepezil (ARICEPT) 5 MG tablet Take 1 tablet (5  mg total) by mouth daily. (Patient not taking: Reported on 09/26/2023) 30 tablet 11   ibuprofen (ADVIL) 800 MG tablet Take 1 tablet by mouth every six to eight hours as needed for pain (Patient not taking: Reported on 09/26/2023) 18 tablet 0   omeprazole (PRILOSEC) 20 MG capsule TAKE 1 CAPSULE (20 MG TOTAL) BY MOUTH DAILY. (Patient not taking: Reported on 09/26/2023) 30 capsule 3   OVER THE COUNTER MEDICATION Take 1 capsule by mouth at bedtime. (Patient not taking: Reported on 09/26/2023)     topiramate (TOPAMAX) 25 MG tablet Take 1 tablet (25 mg total) by mouth at bedtime as needed. (Patient not taking: Reported on 09/26/2023) 90 tablet 1   Current Facility-Administered Medications on File Prior to Visit  Medication Dose Route Frequency Provider Last Rate Last Admin   [START ON 02/19/2024] denosumab (PROLIA) injection 60 mg  60 mg Subcutaneous Q6 months Shamleffer, Konrad Dolores, MD        No Known Allergies  Social History   Socioeconomic History   Marital  status: Married    Spouse name: Not on file   Number of children: 1   Years of education: 12 grade   Highest education level: Not on file  Occupational History   Occupation: unemployed  Tobacco Use   Smoking status: Never   Smokeless tobacco: Never  Vaping Use   Vaping status: Never Used  Substance and Sexual Activity   Alcohol use: Never   Drug use: Never   Sexual activity: Yes    Birth control/protection: Post-menopausal  Other Topics Concern   Not on file  Social History Narrative   Right handed   Lives with husband   spanish   Social Drivers of Health   Financial Resource Strain: Medium Risk (08/06/2022)   Overall Financial Resource Strain (CARDIA)    Difficulty of Paying Living Expenses: Somewhat hard  Food Insecurity: Food Insecurity Present (08/06/2022)   Hunger Vital Sign    Worried About Running Out of Food in the Last Year: Sometimes true    Ran Out of Food in the Last Year: Sometimes true  Transportation  Needs: Unmet Transportation Needs (08/06/2022)   PRAPARE - Transportation    Lack of Transportation (Medical): Yes    Lack of Transportation (Non-Medical): Yes  Physical Activity: Inactive (08/06/2022)   Exercise Vital Sign    Days of Exercise per Week: 0 days    Minutes of Exercise per Session: 0 min  Stress: No Stress Concern Present (08/06/2022)   [employer] of Occupational Health - Occupational Stress Questionnaire    Feeling of Stress : Not at all  Social Connections: Socially Isolated (08/06/2022)   Social Connection and Isolation Panel [NHANES]    Frequency of Communication with Friends and Family: Once a week    Frequency of Social Gatherings with Friends and Family: Never    Attends Religious Services: Never    Database administrator or Organizations: No    Attends Engineer, structural: Never    Marital Status: Married  Catering manager Violence: Not on file    Family History  Problem Relation Age of Onset   Diabetes Mother    Colon cancer Neg Hx    Colon polyps Neg Hx    Esophageal cancer Neg Hx    Rectal cancer Neg Hx    Stomach cancer Neg Hx     Past Surgical History:  Procedure Laterality Date   ABDOMINAL HYSTERECTOMY     fibroid   CHOLECYSTECTOMY     INCONTINENCE SURGERY     REFRACTIVE SURGERY  2017   BL    ROS: Review of Systems Negative except as stated above  PHYSICAL EXAM: BP 116/72 (BP Location: Left Arm, Patient Position: Sitting, Cuff Size: Normal)   Pulse 83   Ht 5\' 6"  (1.676 m)   Wt 205 lb (93 kg)   SpO2 98%   BMI 33.09 kg/m   Physical Exam  General appearance - alert, well appearing, older Hispanic female and in no distress Mental status - normal mood, behavior, speech, dress, motor activity, and thought processes Mouth - mucous membranes moist, pharynx normal without lesions Neck - supple, no significant adenopathy Chest - clear to auscultation, no wheezes, rales or rhonchi, symmetric air entry Heart - normal rate,  regular rhythm, normal S1, S2, no murmurs, rubs, clicks or gallops Extremities - peripheral pulses normal, no pedal edema, no clubbing or cyanosis Ambulates with cane Diabetic Foot Exam - Simple   Simple Foot Form Diabetic Foot exam was performed with the following findings:  Yes 09/26/2023 11:45 AM  Visual Inspection No deformities, no ulcerations, no other skin breakdown bilaterally: Yes Sensation Testing Pulse Check Posterior Tibialis and Dorsalis pulse intact bilaterally: Yes Comments Mild decrease on plantar surface in certain areas BL        05/23/2023   10:32 AM 03/30/2022    1:41 PM 02/23/2021    3:02 PM  Depression screen PHQ 2/9  Decreased Interest 1 3 0  Down, Depressed, Hopeless 0  0  PHQ - 2 Score 1 3 0  Altered sleeping 1 3   Tired, decreased energy 1 3   Change in appetite 1 0   Feeling bad or failure about yourself  0 0   Trouble concentrating 0 0   Moving slowly or fidgety/restless 0 0   Suicidal thoughts 0 0   PHQ-9 Score 4 9   Difficult doing work/chores Not difficult at all         Latest Ref Rng & Units 04/13/2023   10:09 AM 04/04/2023    9:02 AM 12/06/2022   11:23 AM  CMP  Glucose 70 - 99 mg/dL 409   811   BUN 8 - 27 mg/dL 19   15   Creatinine 9.14 - 1.00 mg/dL 7.82   9.56   Sodium 213 - 144 mmol/L 136   136   Potassium 3.5 - 5.2 mmol/L 5.3   4.6   Chloride 96 - 106 mmol/L 100   101   CO2 20 - 29 mmol/L 23   24   Calcium 8.7 - 10.3 mg/dL 9.0  9.0  9.1   Total Protein 6.0 - 8.5 g/dL   7.6   Total Bilirubin 0.0 - 1.2 mg/dL   <0.8   Alkaline Phos 44 - 121 IU/L   100   AST 0 - 40 IU/L   17   ALT 0 - 32 IU/L   11    Lipid Panel     Component Value Date/Time   CHOL 130 12/06/2022 1123   TRIG 106 12/06/2022 1123   HDL 58 12/06/2022 1123   CHOLHDL 2.2 12/06/2022 1123   LDLCALC 53 12/06/2022 1123    CBC    Component Value Date/Time   WBC 10.4 02/16/2023 0953   WBC 10.3 04/19/2022 0824   RBC 4.02 02/16/2023 0953   RBC 3.70 (L) 04/19/2022  0824   HGB 11.8 02/16/2023 0953   HCT 36.7 02/16/2023 0953   PLT 464 (H) 02/16/2023 0953   MCV 91 02/16/2023 0953   MCH 29.4 02/16/2023 0953   MCH 29.7 04/19/2022 0824   MCHC 32.2 02/16/2023 0953   MCHC 31.6 04/19/2022 0824   RDW 12.9 02/16/2023 0953   LYMPHSABS 1.9 02/16/2023 0953   MONOABS 0.7 04/19/2022 0824   EOSABS 0.2 02/16/2023 0953   BASOSABS 0.1 02/16/2023 0953    ASSESSMENT AND PLAN: 1. Type 2 diabetes mellitus with morbid obesity (HCC) (Primary) At goal.  Encouraged her to continue healthy eating habits.  Prescription sent to her pharmacy for the freestyle [place] reader and sensor. Continue Farxiga 5 mg daily and NPH/regular insulin 20 units in a.m/10 units; - POCT glycosylated hemoglobin (Hb A1C) - POCT glucose (manual entry) - Continuous Glucose Receiver (FREESTYLE LIBRE 3 READER) DEVI; Use as directed.  Dispense: 1 each; Refill: 0 - Continuous Glucose Sensor (FREESTYLE LIBRE 3 PLUS SENSOR) MISC; USE TO MONITOR BLOOD GLUCOSE CONTINOUSLY. CHANGE SENSOR EVERY 15 DAYS.  Dispense: 2 each; Refill: 6  2. Type 2 diabetes mellitus with peripheral neuropathy (  HCC) -Patient does not have any ulcers, calluses or amputation on the feet to meet criteria for diabetic shoes. 3. Insulin long-term use (HCC) 4. Diabetes mellitus treated with oral medication (HCC) See #1 above.  5. Hypertension associated with diabetes (HCC) At goal.  Continue Cozaar 50 mg daily and amlodipine 7.5 mg daily  6. CKD stage 3a, GFR 45-59 ml/min (HCC) GFR's have been in the 40s.  Continue to monitor.  7. Memory difficulties Patient feels that Aricept has helped.  She will continue taking  8. Age-related osteoporosis without current pathological fracture On Prolia through endocrinology.  Patient was given the opportunity to ask questions.  Patient verbalized understanding of the plan and was able to repeat key elements of the plan.   This documentation was completed using Public librarian.  Any transcriptional errors are unintentional.  Orders Placed This Encounter  Procedures   POCT glycosylated hemoglobin (Hb A1C)   POCT glucose (manual entry)     Requested Prescriptions   Signed Prescriptions Disp Refills   Continuous Glucose Receiver (FREESTYLE LIBRE 3 READER) DEVI 1 each 0    Sig: Use as directed.   Continuous Glucose Sensor (FREESTYLE LIBRE 3 PLUS SENSOR) MISC 2 each 6    Sig: USE TO MONITOR BLOOD GLUCOSE CONTINOUSLY. CHANGE SENSOR EVERY 15 DAYS.    Return in about 4 months (around 01/26/2024).  Concetta Dee, MD, FACP

## 2023-10-07 ENCOUNTER — Other Ambulatory Visit: Payer: Self-pay

## 2023-10-07 MED ORDER — DAPAGLIFLOZIN PROPANEDIOL 5 MG PO TABS
5.0000 mg | ORAL_TABLET | Freq: Every day | ORAL | 11 refills | Status: AC
  Filled 2023-10-07 – 2023-10-21 (×2): qty 30, 30d supply, fill #0

## 2023-10-18 ENCOUNTER — Other Ambulatory Visit: Payer: Self-pay

## 2023-10-19 ENCOUNTER — Other Ambulatory Visit: Payer: Self-pay

## 2023-10-21 ENCOUNTER — Other Ambulatory Visit: Payer: Self-pay

## 2023-10-22 ENCOUNTER — Other Ambulatory Visit: Payer: Self-pay | Admitting: Family Medicine

## 2023-10-22 DIAGNOSIS — E1142 Type 2 diabetes mellitus with diabetic polyneuropathy: Secondary | ICD-10-CM

## 2023-10-24 ENCOUNTER — Other Ambulatory Visit: Payer: Self-pay

## 2023-10-28 ENCOUNTER — Other Ambulatory Visit: Payer: Self-pay

## 2023-10-31 LAB — HM DIABETES EYE EXAM

## 2023-11-21 ENCOUNTER — Ambulatory Visit: Admitting: Urology

## 2023-11-21 NOTE — Progress Notes (Deleted)
 Assessment: 1. Gross hematuria -negative evaluation 2/25   2. Urinary incontinence in female      Plan: Her evaluation for gross hematuria was negative with CT imaging, and cystoscopy.  All U/As have been negative for blood. Recommend further evaluation for possible GI source of the bleeding. Continue solifenacin  10 mg daily.   Return to office in 3 months.  Chief Complaint:  No chief complaint on file.   History of Present Illness:  Chelsea Coleman de Chelsea Coleman is a 68 y.o. female who is seen for further evaluation of hematuria and urinary incontinence.   At her initial visit in 12/24, she reported noting blood on her underwear or pad for approximately 2 months.  This was occurring daily.  She had not seen blood in her urine.  She denied any urinary incontinence.  No dysuria or flank pain.  No history of UTI's or kidney stones.  U/A 10/24:  0 RBC, 0-5 WBC U/A 12/24:  negative blood  CT abdomen/pelvis 05/07/23: No renal or ureteral calculi, no renal masses, no evidence of obstruction or filling defect.  No history of tobacco use. She has previously been seen by gynecology and no abnormality was noted on pelvic examination.  She continued to notice blood on her underwear and with wiping.  No dysuria or gross hematuria.   She reported increased problems with incontinence, leaking both daytime and nighttime.  No urgency or awareness of incontinence. She was taking oxybutynin  XL 5 mg nightly. Cystoscopy from 07/18/2023 showed no urethral or bladder abnormalities. She was given a trial of Solifenacin  10 mg daily in place of the oxybutynin  for management of her incontinence.  At her visit in March 2025, she had not had any further episodes of gross hematuria.  She did have an episode of blood from the rectum approximately 3 weeks prior.  She continued on Solifenacin  with improvement in her incontinence.  No side effects.  No dysuria.  An interpreter was used throughout the  visit.  Past Medical History:  Past Medical History:  Diagnosis Date   Cataract    states removed in 2014   Chronic kidney disease 02/14/2020   Stage 3    Diabetes mellitus without complication (HCC)    GERD (gastroesophageal reflux disease)    Hypertension     Past Surgical History:  Past Surgical History:  Procedure Laterality Date   ABDOMINAL HYSTERECTOMY     fibroid   CHOLECYSTECTOMY     INCONTINENCE SURGERY     REFRACTIVE SURGERY  2017   BL    Allergies:  No Known Allergies  Family History:  Family History  Problem Relation Age of Onset   Diabetes Mother    Colon cancer Neg Hx    Colon polyps Neg Hx    Esophageal cancer Neg Hx    Rectal cancer Neg Hx    Stomach cancer Neg Hx     Social History:  Social History   Tobacco Use   Smoking status: Never   Smokeless tobacco: Never  Vaping Use   Vaping status: Never Used  Substance Use Topics   Alcohol use: Never   Drug use: Never    ROS: Constitutional:  Negative for fever, chills, weight loss CV: Negative for chest pain, previous MI, hypertension Respiratory:  Negative for shortness of breath, wheezing, sleep apnea, frequent cough GI:  Negative for nausea, vomiting, bloody stool, GERD  Physical exam: There were no vitals taken for this visit. GENERAL APPEARANCE:  Well appearing, well developed,  well nourished, NAD HEENT:  Atraumatic, normocephalic, oropharynx clear NECK:  Supple without lymphadenopathy or thyromegaly ABDOMEN:  Soft, non-tender, no masses EXTREMITIES:  Moves all extremities well, without clubbing, cyanosis, or edema NEUROLOGIC:  Alert and oriented x 3, normal gait, CN II-XII grossly intact MENTAL STATUS:  appropriate BACK:  Non-tender to palpation, No CVAT SKIN:  Warm, dry, and intact  Results: U/A: ***

## 2023-11-28 ENCOUNTER — Other Ambulatory Visit: Payer: Self-pay

## 2023-12-05 ENCOUNTER — Other Ambulatory Visit: Payer: Self-pay

## 2023-12-19 ENCOUNTER — Encounter: Payer: Self-pay | Admitting: Internal Medicine

## 2023-12-19 ENCOUNTER — Other Ambulatory Visit: Payer: Self-pay

## 2023-12-19 ENCOUNTER — Ambulatory Visit: Payer: Medicare Other | Attending: Internal Medicine | Admitting: Internal Medicine

## 2023-12-19 VITALS — BP 124/76 | HR 73 | Temp 97.6°F | Ht 66.0 in | Wt 200.0 lb

## 2023-12-19 DIAGNOSIS — Z7189 Other specified counseling: Secondary | ICD-10-CM

## 2023-12-19 DIAGNOSIS — Z760 Encounter for issue of repeat prescription: Secondary | ICD-10-CM | POA: Diagnosis not present

## 2023-12-19 DIAGNOSIS — Z0000 Encounter for general adult medical examination without abnormal findings: Secondary | ICD-10-CM | POA: Diagnosis not present

## 2023-12-19 DIAGNOSIS — Z23 Encounter for immunization: Secondary | ICD-10-CM

## 2023-12-19 DIAGNOSIS — R269 Unspecified abnormalities of gait and mobility: Secondary | ICD-10-CM

## 2023-12-19 MED ORDER — ATORVASTATIN CALCIUM 20 MG PO TABS
20.0000 mg | ORAL_TABLET | Freq: Every day | ORAL | 3 refills | Status: AC
  Filled 2023-12-19 – 2024-02-06 (×2): qty 90, 90d supply, fill #0
  Filled 2024-05-07: qty 90, 90d supply, fill #1

## 2023-12-19 MED ORDER — AMLODIPINE BESYLATE 5 MG PO TABS
7.5000 mg | ORAL_TABLET | Freq: Every day | ORAL | 1 refills | Status: DC
  Filled 2023-12-19 – 2024-02-06 (×2): qty 135, 90d supply, fill #0
  Filled 2024-05-07: qty 135, 90d supply, fill #1

## 2023-12-19 MED ORDER — LOSARTAN POTASSIUM 50 MG PO TABS
50.0000 mg | ORAL_TABLET | Freq: Every day | ORAL | 1 refills | Status: DC
  Filled 2023-12-19 – 2024-02-06 (×2): qty 90, 90d supply, fill #0
  Filled 2024-05-07: qty 90, 90d supply, fill #1

## 2023-12-19 NOTE — Progress Notes (Unsigned)
 Subjective:   Krisy Arevalo de Kodi Guerrera is a 68 y.o. female who presents for Medicare Annual (Subsequent) preventive examination. Pt with hx of DM neuropathy and retinopathy, HTN, HL, obesity, anemia of chronic ds, thrombocytosis/leukocytosis (followed by hematology), CKD 3b, ACD, Vit D, osteoporosis (Dr. Kellie), secondary hyperparathyroidism, migraines, chronic BL infarct cerebellar hemisphere, mix urinary incont.    Pt will be going to [place] 7/18-8/20/2025  [employer] Language interpreter used during this encounter. #[id number], Mauricio   Visit Complete: In person  Patient Medicare AWV questionnaire was completed by the patient on 12/19/23; I have confirmed that all information answered by patient is correct and no changes since this date.  Cardiac Risk Factors include: diabetes mellitus;advanced age (>109men, >23 women);hypertension     Objective:    Today's Vitals   12/19/23 0913  BP: 124/76  Pulse: 73  Temp: 97.6 F (36.4 C)  TempSrc: Oral  SpO2: 93%  Weight: 200 lb (90.7 kg)  Height: 5' 6 (1.676 m)   Body mass index is 32.28 kg/m.     12/19/2023    8:50 AM 09/19/2023    9:19 AM 06/20/2023   10:05 AM 08/06/2022    8:52 AM 04/19/2022    8:01 AM 02/23/2021    3:00 PM 11/18/2020    1:32 PM  Advanced Directives  Does Patient Have a Medical Advance Directive? No Yes No No No No No  Type of Advance Directive  Healthcare Power of Attorney       Does patient want to make changes to medical advance directive?  No - Patient declined       Copy of Healthcare Power of Attorney in Chart?  No - copy requested       Would patient like information on creating a medical advance directive? Yes (MAU/Ambulatory/Procedural Areas - Information given)  No - Patient declined  No - Patient declined No - Patient declined No - Patient declined    Current Medications (verified) Outpatient Encounter Medications as of 12/19/2023  Medication Sig   Blood Glucose Monitoring Suppl (ACCU-CHEK  GUIDE ME) w/Device KIT Use to check blood sugar 3 TIMES DAILY.   Continuous Glucose Receiver (FREESTYLE LIBRE 3 READER) DEVI Use as directed.   Continuous Glucose Sensor (FREESTYLE LIBRE 3 PLUS SENSOR) MISC USE TO MONITOR BLOOD GLUCOSE CONTINOUSLY. CHANGE SENSOR EVERY 15 DAYS.   dapagliflozin  propanediol (FARXIGA ) 5 MG TABS tablet Take 1 tablet (5 mg total) by mouth daily.   donepezil  (ARICEPT ) 5 MG tablet Take 1 tablet (5 mg total) by mouth daily.   ergocalciferol  (VITAMIN D2) 1.25 MG (50000 UT) capsule Take 1 capsule (50,000 Units total) by mouth once a week.   fluticasone  (FLONASE ) 50 MCG/ACT nasal spray Place 1 spray into both nostrils daily.   gabapentin  (NEURONTIN ) 400 MG capsule TAKE 1 CAPSULE BY MOUTH TWICE  DAILY   glucose blood (ACCU-CHEK GUIDE) test strip Use to check blood sugar 3 TIMES DAILY.   insulin  isophane & regular human KwikPen (HUMULIN  70/30 KWIKPEN) (70-30) 100 UNIT/ML KwikPen Inject 20-23 units subcutaneously with breakfast and 12 units with dinner.   Insulin  Pen Needle (TRUEPLUS PEN NEEDLES) 32G X 4 MM MISC Use to inject insulin  twice daily.   Insulin  Syringe-Needle U-100 (TRUEPLUS INSULIN  SYRINGE) 31G X 5/16 0.3 ML MISC Use to inject insulin  twice daily   omeprazole  (PRILOSEC) 20 MG capsule TAKE 1 CAPSULE (20 MG TOTAL) BY MOUTH DAILY.   solifenacin  (VESICARE ) 10 MG tablet Take 1 tablet (10 mg total) by mouth daily.  traMADol  (ULTRAM ) 50 MG tablet Take 1 tablet (50 mg total) by mouth every 6 (six) hours as needed.   Accu-Chek FastClix Lancets MISC Use to check blood sugar 3 TIMES DAILY. (Patient not taking: Reported on 09/26/2023)   amLODipine  (NORVASC ) 5 MG tablet Take 1.5 tablets (7.5 mg total) by mouth daily.   aspirin  EC 81 MG tablet Take 1 tablet (81 mg total) by mouth daily. Swallow whole. (Patient not taking: Reported on 12/19/2023)   atorvastatin  (LIPITOR) 20 MG tablet Take 1 tablet (20 mg total) by mouth daily.   clobetasol  (TEMOVATE ) 0.05 % external solution APPLY  SMALL AMOUNT TO AFFECTED AREA 3 TIMES A WEEK AS NEEDED. (Patient not taking: Reported on 08/15/2023)   dapagliflozin  propanediol (FARXIGA ) 5 MG TABS tablet Take 1 tablet (5 mg total) by mouth daily. (Patient not taking: Reported on 12/19/2023)   ibuprofen  (ADVIL ) 800 MG tablet Take 1 tablet by mouth every six to eight hours as needed for pain (Patient not taking: Reported on 12/19/2023)   losartan  (COZAAR ) 50 MG tablet Take 1 tablet (50 mg total) by mouth daily.   methocarbamol  (ROBAXIN ) 500 MG tablet Take 1 tablet (500 mg total) by mouth daily as needed (Medication can cause drowsiness). (Patient not taking: Reported on 12/19/2023)   OVER THE COUNTER MEDICATION Take 1 capsule by mouth at bedtime. (Patient not taking: Reported on 12/19/2023)   topiramate  (TOPAMAX ) 25 MG tablet Take 1 tablet (25 mg total) by mouth at bedtime as needed. (Patient not taking: Reported on 12/19/2023)   Facility-Administered Encounter Medications as of 12/19/2023  Medication   [START ON 02/19/2024] denosumab  (PROLIA ) injection 60 mg    Allergies (verified) Patient has no known allergies.   History: Past Medical History:  Diagnosis Date   Cataract    states removed in 2014   Chronic kidney disease 02/14/2020   Stage 3    Diabetes mellitus without complication (HCC)    GERD (gastroesophageal reflux disease)    Hypertension    Past Surgical History:  Procedure Laterality Date   ABDOMINAL HYSTERECTOMY     fibroid   CHOLECYSTECTOMY     INCONTINENCE SURGERY     REFRACTIVE SURGERY  2017   BL   Family History  Problem Relation Age of Onset   Diabetes Mother    Colon cancer Neg Hx    Colon polyps Neg Hx    Esophageal cancer Neg Hx    Rectal cancer Neg Hx    Stomach cancer Neg Hx    Social History   Socioeconomic History   Marital status: Married    Spouse name: Not on file   Number of children: 1   Years of education: 12 grade   Highest education level: Not on file  Occupational History   Occupation:  unemployed  Tobacco Use   Smoking status: Never   Smokeless tobacco: Never  Vaping Use   Vaping status: Never Used  Substance and Sexual Activity   Alcohol use: Never   Drug use: Never   Sexual activity: Yes    Birth control/protection: Post-menopausal  Other Topics Concern   Not on file  Social History Narrative   Right handed   Lives with husband   spanish   Social Drivers of Health   Financial Resource Strain: Medium Risk (12/19/2023)   Overall Financial Resource Strain (CARDIA)    Difficulty of Paying Living Expenses: Somewhat hard  Food Insecurity: No Food Insecurity (12/19/2023)   Hunger Vital Sign    Worried About Running  Out of Food in the Last Year: Never true    Ran Out of Food in the Last Year: Never true  Transportation Needs: Unmet Transportation Needs (12/19/2023)   PRAPARE - Administrator, Civil Service (Medical): Yes    Lack of Transportation (Non-Medical): No  Physical Activity: Inactive (12/19/2023)   Exercise Vital Sign    Days of Exercise per Week: 0 days    Minutes of Exercise per Session: 0 min  Stress: No Stress Concern Present (12/19/2023)   [employer] of Occupational Health - Occupational Stress Questionnaire    Feeling of Stress: Not at all  Social Connections: Moderately Isolated (12/19/2023)   Social Connection and Isolation Panel    Frequency of Communication with Friends and Family: Three times a week    Frequency of Social Gatherings with Friends and Family: Once a week    Attends Religious Services: Never    Database administrator or Organizations: No    Attends Banker Meetings: Never    Marital Status: Married    Tobacco Counseling nonsmoker   Activities of Daily Living    12/19/2023    8:53 AM  In your present state of health, do you have any difficulty performing the following activities:  Hearing? 0  Vision? 1  Difficulty concentrating or making decisions? 0  Walking or climbing stairs? 0  Dressing or  bathing? 0  Doing errands, shopping? 1  Preparing Food and eating ? N  Using the Toilet? N  In the past six months, have you accidently leaked urine? N  Do you have problems with loss of bowel control? N  Managing your Medications? N  Managing your Finances? Y  Comment Husband manages finances due to patient's limited vision  Housekeeping or managing your Housekeeping? N    Patient Care Team: Vicci Barnie NOVAK, MD as PCP - General (Internal Medicine) [facility]  Indicate any recent Medical Services you may have received from other than [facility] providers in the past year (date may be approximate). [place] [facility]y: PA Camie Sevin [facility] urology: Dr. Adine Roseann Finn endocrinology: Dr. Kellie [facility]: Dr. Kyra Crown  Assessment:   This is a routine wellness examination for Sailor.  Hearing/Vision screen No results found. Gross hearing intact/ Pt up to date with eye exam   Goals Addressed             This Visit's Progress    Weight (lb) < 200 lb (90.7 kg)   200 lb (90.7 kg)    I would like to lose 50 lbs       Depression Screen    12/19/2023    8:47 AM 05/23/2023   10:32 AM 03/30/2022    1:41 PM 02/23/2021    3:02 PM 04/28/2020    1:35 PM 02/04/2020    9:21 AM 12/24/2019    4:05 PM  PHQ 2/9 Scores  PHQ - 2 Score 1 1 3  0 0 0 0  PHQ- 9 Score 3 4 9         Fall Risk    12/19/2023    8:47 AM 09/19/2023    9:19 AM 06/20/2023   10:05 AM 02/16/2023    9:25 AM 12/06/2022    9:41 AM  Fall Risk   Falls in the past year? 0 0 0 0 0  Number falls in past yr: 0 0 0 0 0  Injury with Fall? 0 0 0 0  0  Risk for fall due to : No Fall Risks   No Fall Risks No Fall Risks  Follow up Falls evaluation completed Falls evaluation completed Falls evaluation completed      MEDICARE RISK AT HOME: Medicare Risk at Home Any stairs in or around the home?: No If so, are there any without handrails?: No Home free  of loose throw rugs in walkways, pet beds, electrical cords, etc?: No Adequate lighting in your home to reduce risk of falls?: No Life alert?: No Use of a cane, walker or w/c?: Yes (Cane) Grab bars in the bathroom?: No Shower chair or bench in shower?: No Elevated toilet seat or a handicapped toilet?: No  TIMED UP AND GO:  Was the test performed?  Yes  Length of time to ambulate 10 feet: 22 sec Gait slow and steady with assistive device    Cognitive Function:    12/19/2023    9:04 AM 08/06/2022    9:08 AM 02/23/2021    3:05 PM 02/04/2020    9:11 AM  MMSE - Mini Mental State Exam  Orientation to time 4 5 5 5   Orientation to Place 3 1 5 5   Registration 3 3 3 3   Attention/ Calculation 4 5 5 5   Recall 3 3 3 3   Language- name 2 objects 2 2 2 2   Language- repeat 0 1 1 1   Language- follow 3 step command 3 3 3 3   Language- read & follow direction 1 1 1 1   Write a sentence 1 1 1 1   Copy design 1 1 1  0  Total score 25 26 30 29       06/20/2023   10:00 AM  Montreal Cognitive Assessment   Visuospatial/ Executive (0/5) 2  Naming (0/3) 3  Attention: Read list of digits (0/2) 1  Attention: Read list of letters (0/1) 1  Attention: Serial 7 subtraction starting at 100 (0/3) 3  Language: Repeat phrase (0/2) 1  Language : Fluency (0/1) 0  Abstraction (0/2) 1  Delayed Recall (0/5) 4  Orientation (0/6) 6  Total 22  Adjusted Score (based on education) 23      08/06/2022    9:13 AM  6CIT Screen  What Year? 4 points  What month? 0 points  What time? 0 points  Count back from 20 0 points  Repeat phrase 0 points    Immunizations Immunization History  Administered Date(s) Administered   Fluad Quad(high Dose 65+) 03/30/2022   Fluad Trivalent(High Dose 65+) 04/04/2023   Influenza,inj,Quad PF,6+ Mos 04/06/2019, 02/04/2020, 03/30/2021   PFIZER(Purple Top)SARS-COV-2 Vaccination 09/02/2019, 09/23/2019   PNEUMOCOCCAL CONJUGATE-20 12/28/2021   Pneumococcal Conjugate-13 12/24/2019   Tdap  04/06/2019   Zoster Recombinant(Shingrix) 10/25/2022, 12/27/2022    TDAP status: Up to date  Flu Vaccine status: Up to date  Pneumococcal vaccine status: Up to date  Covid-19 vaccine status: Information provided on how to obtain vaccines.   Qualifies for Shingles Vaccine? Yes   Zostavax completed No   Shingrix Completed?: Yes  Screening Tests Health Maintenance  Topic Date Due   COVID-19 Vaccine (3 - 2024-25 season) 02/13/2023   Diabetic kidney evaluation - Urine ACR  12/06/2023   INFLUENZA VACCINE  01/13/2024   HEMOGLOBIN A1C  03/27/2024   Diabetic kidney evaluation - eGFR measurement  04/12/2024   FOOT EXAM  09/25/2024   OPHTHALMOLOGY EXAM  10/30/2024   Medicare Annual Wellness (AWV)  12/18/2024   MAMMOGRAM  04/24/2025   Colonoscopy  05/26/2025   DTaP/Tdap/Td (2 -  Td or Tdap) 04/05/2029   Pneumococcal Vaccine: 50+ Years  Completed   DEXA SCAN  Completed   Hepatitis C Screening  Completed   Zoster Vaccines- Shingrix  Completed   Hepatitis B Vaccines  Aged Out   HPV VACCINES  Aged Out   Meningococcal B Vaccine  Aged Out    Health Maintenance  Health Maintenance Due  Topic Date Due   COVID-19 Vaccine (3 - 2024-25 season) 02/13/2023   Diabetic kidney evaluation - Urine ACR  12/06/2023    Colorectal cancer screening: Type of screening: Colonoscopy. Completed 05/26/20. Repeat every 5 years  Mammogram status: Completed 04/2023. Repeat every year  Bone Density status: Completed 04/2021. Results reflect: Bone density results: OSTEOPOROSIS. Repeat every 3 years.  Lung Cancer Screening: (Low Dose CT Chest recommended if Age 69-80 years, 20 pack-year currently smoking OR have quit w/in 15years.) does not qualify.   Lung Cancer Screening Referral: NA  Additional Screening:  Hepatitis C Screening: does qualify; Completed yes  Vision Screening: Recommended annual ophthalmology exams for early detection of glaucoma and other disorders of the eye. Is the patient up to  date with their annual eye exam?  Yes  Who is the provider or what is the name of the office in which the patient attends annual eye exams? Dr. Octavia If pt is not established with a provider, would they like to be referred to a provider to establish care? NA.   Dental Screening: Recommended annual dental exams for proper oral hygiene  Diabetic Foot Exam: Diabetic Foot Exam: Completed 09/2023  Community Resource Referral / Chronic Care Management: CRR required this visit?  No   CCM required this visit?  No     Plan:    1. Encounter for Medicare annual wellness exam (Primary)   2. Advance directive discussed with patient Santina over advanced directive packet with her.  Encouraged her to consider completing a living will or healthcare power of attorney.  If executed, recommend that she brings a copy for our record.  3. Medication refill Patient requested some medication refills and those were sent to her pharmacy - amLODipine  (NORVASC ) 5 MG tablet; Take 1.5 tablets (7.5 mg total) by mouth daily.  Dispense: 135 tablet; Refill: 1 - atorvastatin  (LIPITOR) 20 MG tablet; Take 1 tablet (20 mg total) by mouth daily.  Dispense: 90 tablet; Refill: 3 - losartan  (COZAAR ) 50 MG tablet; Take 1 tablet (50 mg total) by mouth daily.  Dispense: 90 tablet; Refill: 1  4. Gait disturbance Get up and go test was abnormal.  We discussed referral to physical therapy but patient declines at this time  5. Need for COVID-19 vaccine Recommend getting an updated COVID booster in the fall.  I have personally reviewed and noted the following in the patient's chart:   Medical and social history Use of alcohol, tobacco or illicit drugs  Current medications and supplements including opioid prescriptions. Patient is not currently taking opioid prescriptions. Functional ability and status Nutritional status Physical activity Advanced directives List of other physicians Hospitalizations, surgeries, and ER visits  in previous 12 months Vitals Screenings to include cognitive, depression, and falls Referrals and appointments  In addition, I have reviewed and discussed with patient certain preventive protocols, quality metrics, and best practice recommendations. A written personalized care plan for preventive services as well as general preventive health recommendations were provided to patient.     Barnie Louder, MD   12/19/2023   After Visit Summary: (In Person-Printed) AVS printed and given to  the patient

## 2023-12-21 ENCOUNTER — Other Ambulatory Visit: Payer: Self-pay

## 2023-12-21 ENCOUNTER — Encounter: Payer: Self-pay | Admitting: Internal Medicine

## 2024-01-28 NOTE — Telephone Encounter (Signed)
 Prolia VOB initiated via [email or web address]  Next Prolia inj DUE: 02/24/24

## 2024-01-30 ENCOUNTER — Ambulatory Visit: Admitting: Internal Medicine

## 2024-02-06 ENCOUNTER — Other Ambulatory Visit: Payer: Self-pay

## 2024-02-07 ENCOUNTER — Other Ambulatory Visit: Payer: Self-pay

## 2024-02-18 NOTE — Telephone Encounter (Signed)
 [employer] - Prior Authorization REQUIRED for [employer]

## 2024-02-23 NOTE — Telephone Encounter (Signed)
 [employer]  Prior Authorization on file and valid PA# [id number] Valid: 05/07/23-05/06/24

## 2024-02-27 ENCOUNTER — Other Ambulatory Visit: Payer: Self-pay

## 2024-02-27 ENCOUNTER — Encounter (INDEPENDENT_AMBULATORY_CARE_PROVIDER_SITE_OTHER): Payer: Self-pay

## 2024-02-27 ENCOUNTER — Encounter: Payer: Self-pay | Admitting: Internal Medicine

## 2024-02-27 ENCOUNTER — Ambulatory Visit: Attending: Internal Medicine | Admitting: Internal Medicine

## 2024-02-27 VITALS — BP 129/77 | HR 65 | Temp 97.6°F | Wt 199.0 lb

## 2024-02-27 DIAGNOSIS — E119 Type 2 diabetes mellitus without complications: Secondary | ICD-10-CM

## 2024-02-27 DIAGNOSIS — E1159 Type 2 diabetes mellitus with other circulatory complications: Secondary | ICD-10-CM | POA: Diagnosis not present

## 2024-02-27 DIAGNOSIS — N1831 Chronic kidney disease, stage 3a: Secondary | ICD-10-CM

## 2024-02-27 DIAGNOSIS — K625 Hemorrhage of anus and rectum: Secondary | ICD-10-CM | POA: Diagnosis not present

## 2024-02-27 DIAGNOSIS — R0981 Nasal congestion: Secondary | ICD-10-CM

## 2024-02-27 DIAGNOSIS — E1169 Type 2 diabetes mellitus with other specified complication: Secondary | ICD-10-CM

## 2024-02-27 DIAGNOSIS — Z7984 Long term (current) use of oral hypoglycemic drugs: Secondary | ICD-10-CM

## 2024-02-27 DIAGNOSIS — Z794 Long term (current) use of insulin: Secondary | ICD-10-CM | POA: Diagnosis not present

## 2024-02-27 DIAGNOSIS — I152 Hypertension secondary to endocrine disorders: Secondary | ICD-10-CM | POA: Diagnosis not present

## 2024-02-27 DIAGNOSIS — Z79899 Other long term (current) drug therapy: Secondary | ICD-10-CM | POA: Diagnosis not present

## 2024-02-27 DIAGNOSIS — E785 Hyperlipidemia, unspecified: Secondary | ICD-10-CM | POA: Diagnosis not present

## 2024-02-27 DIAGNOSIS — E669 Obesity, unspecified: Secondary | ICD-10-CM | POA: Diagnosis not present

## 2024-02-27 LAB — POCT GLYCOSYLATED HEMOGLOBIN (HGB A1C): HbA1c, POC (controlled diabetic range): 7.8 % — AB (ref 0.0–7.0)

## 2024-02-27 LAB — GLUCOSE, POCT (MANUAL RESULT ENTRY): POC Glucose: 140 mg/dL — AB (ref 70–99)

## 2024-02-27 MED ORDER — RYBELSUS 3 MG PO TABS
3.0000 mg | ORAL_TABLET | Freq: Every day | ORAL | 3 refills | Status: DC
  Filled 2024-02-27: qty 30, 30d supply, fill #0
  Filled 2024-04-16: qty 30, 30d supply, fill #1
  Filled 2024-05-28 – 2024-05-30 (×3): qty 30, 30d supply, fill #2

## 2024-02-27 NOTE — Progress Notes (Signed)
 Patient ID: Chelsea Coleman, female    DOB: [birth year]  MRN: [id number]  CC: Diabetes (DM f/u. Layvonne for Humulin  [facility] to [facility]/Yes to flu vax)   Subjective: Chelsea Coleman is a 68 y.o. female who presents for chronic ds management. Her concerns today include:  Pt with hx of DM neuropathy and retinopathy, HTN, HL, obesity, anemia of chronic ds, thrombocytosis/leukocytosis (followed by hematology), CKD 3b, ACD, Vit D, osteoporosis (Dr. 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    AMN Language interpreter used during this encounter. #Chelsea Coleman [id number]   Discussed the use of AI scribe software for clinical note transcription with the patient, who gave verbal consent to proceed.  History of Present Illness Chelsea Coleman is a 68 year old female who presents for follow-up on her diabetes management.  DM:  Results for orders placed or performed in visit on 02/27/24  POCT glucose (manual entry)   Collection Time: 02/27/24 11:08 AM  Result Value Ref Range   POC Glucose 140 (A) 70 - 99 mg/dl  POCT glycosylated hemoglobin (Hb A1C)   Collection Time: 02/27/24 11:13 AM  Result Value Ref Range   Hemoglobin A1C     HbA1c POC (<> result, manual entry)     HbA1c, POC (prediabetic range)     HbA1c, POC (controlled diabetic range) 7.8 (A) 0.0 - 7.0 %  She has been experiencing challenges in managing her diabetes, with her A1c increasing from 7.3 to 7.8. She is on an insulin  regimen of NPH/Regular 20 units in the morning and 10 units at night and Farxiga  5 mg daily. Her blood sugar levels are unpredictable, with fluctuations throughout the day. A continuous glucose monitor indicates that her glucose levels were within the target range 56% of the time, high 44% and no lows over the past week, with most high readings occurring between 11 AM and  7 PM. Over the past 1 mth, TIR 60% and high 40% with no lows.  She has lost 5 pounds since April. Reports good eating habits.  HTN: Her blood pressure today was 120/77 mmHg. She continues to take Cozaar  50 mg daily and amlodipine  1.5 tablets daily.  CKD3: She has chronic kidney disease and is monitored by a nephrologist annually, with the last visit in June. GFR has been in the upper 40s. She is taking Farxiga  5 mg daily and vitamin D  2000 units daily.  HL: she is on atorvastatin  20 mg, half a tablet daily.  Thinks she is having rectal bleeding. Investigation started 1 yr ago as possible vaginal bleeding. Seen by GYN. Also had negative work up by urology. She now thinks that the bleeding is coming from the rectum. She experiences rectal bleeding, particularly noticeable during showers. Does not notice blood in stools or after she wipes post BM. A colonoscopy several years ago showed internal hemorrhoids. She takes medication to soften her bowel movements so denies hard stools.  She experiences nasal congestion, particularly when lying down, and uses Flonase  nasal spray and Claritin  daily, though she reports minimal relief. Request referral to ENT.    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 12/28/2021   Mixed stress and urge urinary incontinence 12/28/2021   Class 2 severe obesity with serious  comorbidity and body mass index (BMI) of 35.0 to 35.9 in adult [facility]) 12/28/2021   Secondary hyperparathyroidism (HCC) 12/02/2021   Age related osteoporosis 11/30/2021   Osteoporosis of multiple sites 04/21/2021   Leukocytosis 11/18/2020   Thrombocytosis 11/18/2020   Hypertension associated with stage 3 chronic kidney disease due to type 2 diabetes mellitus (HCC) 11/03/2020   Obesity (BMI 35.0-39.9 without comorbidity) 11/03/2020    Hyperlipidemia associated with type 2 diabetes mellitus (HCC) 04/06/2019   Gastroesophageal reflux disease without esophagitis 04/06/2019   Type 2 diabetes, controlled, with peripheral neuropathy (HCC) 01/26/2019   Diabetic retinopathy associated with controlled type 2 diabetes mellitus (HCC) 01/26/2019   Essential hypertension 01/26/2019   Anemia 01/26/2019   Drug-induced constipation 01/26/2019   Memory impairment 01/26/2019   Positive depression screening 01/26/2019     Current Outpatient Medications on File Prior to Visit  Medication Sig Dispense Refill   Accu-Chek FastClix Lancets MISC Use to check blood sugar 3 TIMES DAILY. 100 each 2   amLODipine  (NORVASC ) 5 MG tablet Take 1.5 tablets (7.5 mg total) by mouth daily. 135 tablet 1   atorvastatin  (LIPITOR) 20 MG tablet Take 1 tablet (20 mg total) by mouth daily. 90 tablet 3   Blood Glucose Monitoring Suppl (ACCU-CHEK GUIDE ME) w/Device KIT Use to check blood sugar 3 TIMES DAILY. 1 kit 0   clobetasol  (TEMOVATE ) 0.05 % external solution APPLY SMALL AMOUNT TO AFFECTED AREA 3 TIMES A WEEK AS NEEDED. 50 mL 1   Continuous Glucose Receiver (FREESTYLE LIBRE 3 READER) DEVI Use as directed. 1 each 0   Continuous Glucose Sensor (FREESTYLE LIBRE 3 PLUS SENSOR) MISC USE TO MONITOR BLOOD GLUCOSE CONTINOUSLY. CHANGE SENSOR EVERY 15 DAYS. 2 each 6   dapagliflozin  propanediol (FARXIGA ) 5 MG TABS tablet Take 1 tablet (5 mg total) by mouth daily. 90 tablet 1   dapagliflozin  propanediol (FARXIGA ) 5 MG TABS tablet Take 1 tablet (5 mg total) by mouth daily. 30 tablet 11   donepezil  (ARICEPT ) 5 MG tablet Take 1 tablet (5 mg total) by mouth daily. 30 tablet 11   ergocalciferol  (VITAMIN D2) 1.25 MG (50000 UT) capsule Take 1 capsule (50,000 Units total) by mouth once a week. 4 capsule 1   fluticasone  (FLONASE ) 50 MCG/ACT nasal spray Place 1 spray into both nostrils daily. 16 g 0   gabapentin  (NEURONTIN ) 400 MG capsule TAKE 1 CAPSULE BY MOUTH TWICE  DAILY 200  capsule 2   glucose blood (ACCU-CHEK GUIDE) test strip Use to check blood sugar 3 TIMES DAILY. 100 each 2   insulin  isophane & regular human KwikPen (HUMULIN  70/30 KWIKPEN) (70-30) 100 UNIT/ML KwikPen Inject 20-23 units subcutaneously with breakfast and 12 units with dinner. 15 mL 11   Insulin  Pen Needle (TRUEPLUS PEN NEEDLES) 32G X 4 MM MISC Use to inject insulin  twice daily. 100 each 2   Insulin  Syringe-Needle U-100 (TRUEPLUS INSULIN  SYRINGE) 31G X 5/16 0.3 ML MISC Use to inject insulin  twice daily 100 each 2   losartan  (COZAAR ) 50 MG tablet Take 1 tablet (50 mg total) by mouth daily. 90 tablet 1   methocarbamol  (ROBAXIN ) 500 MG tablet Take 1 tablet (500 mg total) by mouth daily as needed (Medication can cause drowsiness). 30 tablet 1   omeprazole  (PRILOSEC) 20 MG capsule TAKE 1 CAPSULE (20 MG TOTAL) BY MOUTH DAILY. 30 capsule 3   OVER THE COUNTER MEDICATION Take 1 capsule by mouth at bedtime.     solifenacin  (VESICARE ) 10 MG tablet Take 1 tablet (10 mg total)  by mouth daily. 30 tablet 11   topiramate  (TOPAMAX ) 25 MG tablet Take 1 tablet (25 mg total) by mouth at bedtime as needed. 90 tablet 1   aspirin  EC 81 MG tablet Take 1 tablet (81 mg total) by mouth daily. Swallow whole. (Patient not taking: Reported on 02/27/2024) 100 tablet 1   Current Facility-Administered Medications on File Prior to Visit  Medication Dose Route Frequency Provider Last Rate Last Admin   denosumab  (PROLIA ) injection 60 mg  60 mg Subcutaneous Q6 months Shamleffer, Ibtehal Jaralla, MD        No Known Allergies  Social History   Socioeconomic History   Marital status: Married    Spouse name: Not on file   Number of children: 1   Years of education: 12 grade   Highest education level: Not on file  Occupational History   Occupation: unemployed  Tobacco Use   Smoking status: Never   Smokeless tobacco: Never  Vaping Use   Vaping status: Never Used  Substance and Sexual Activity   Alcohol use: Never   Drug  use: Never   Sexual activity: Yes    Birth control/protection: Post-menopausal  Other Topics Concern   Not on file  Social History Narrative   Right handed   Lives with husband   spanish   Social Drivers of Health   Financial Resource Strain: Medium Risk (12/19/2023)   Overall Financial Resource Strain (CARDIA)    Difficulty of Paying Living Expenses: Somewhat hard  Food Insecurity: No Food Insecurity (12/19/2023)   Hunger Vital Sign    Worried About Running Out of Food in the Last Year: Never true    Ran Out of Food in the Last Year: Never true  Transportation Needs: Unmet Transportation Needs (12/19/2023)   PRAPARE - Administrator, Civil Service (Medical): Yes    Lack of Transportation (Non-Medical): No  Physical Activity: Inactive (12/19/2023)   Exercise Vital Sign    Days of Exercise per Week: 0 days    Minutes of Exercise per Session: 0 min  Stress: No Stress Concern Present (12/19/2023)   [employer] of Occupational Health - Occupational Stress Questionnaire    Feeling of Stress: Not at all  Social Connections: Moderately Isolated (12/19/2023)   Social Connection and Isolation Panel    Frequency of Communication with Friends and Family: Three times a week    Frequency of Social Gatherings with Friends and Family: Once a week    Attends Religious Services: Never    Database administrator or Organizations: No    Attends Banker Meetings: Never    Marital Status: Married  Catering manager Violence: Not At Risk (12/19/2023)   Humiliation, Afraid, Rape, and Kick questionnaire    Fear of Current or Ex-Partner: No    Emotionally Abused: No    Physically Abused: No    Sexually Abused: No    Family History  Problem Relation Age of Onset   Diabetes Mother    Colon cancer Neg Hx    Colon polyps Neg Hx    Esophageal cancer Neg Hx    Rectal cancer Neg Hx    Stomach cancer Neg Hx     Past Surgical History:  Procedure Laterality Date   ABDOMINAL  HYSTERECTOMY     fibroid   CHOLECYSTECTOMY     INCONTINENCE SURGERY     REFRACTIVE SURGERY  2017   BL    ROS: Review of Systems Negative except as stated above  PHYSICAL EXAM: BP 129/77 (BP Location: Left Arm, Patient Position: Sitting, Cuff Size: Normal)   Pulse 65   Temp 97.6 F (36.4 C) (Oral)   Wt 199 lb (90.3 kg)   SpO2 97%   BMI 32.12 kg/m   Wt Readings from Last 3 Encounters:  02/27/24 199 lb (90.3 kg)  12/19/23 200 lb (90.7 kg)  09/26/23 205 lb (93 kg)    Physical Exam  General appearance - alert, well appearing, and in no distress Mental status - normal mood, behavior, speech, dress, motor activity, and thought processes Nose - normal and patent, no erythema, discharge or polyps Mouth - mucous membranes moist, pharynx normal without lesions Neck - supple, no significant adenopathy Chest - clear to auscultation, no wheezes, rales or rhonchi, symmetric air entry Heart - normal rate, regular rhythm, normal S1, S2, no murmurs, rubs, clicks or gallops Extremities - no LE edema MSK: ambulates with cane      Latest Ref Rng & Units 04/13/2023   10:09 AM 04/04/2023    9:02 AM 12/06/2022   11:23 AM  CMP  Glucose 70 - 99 mg/dL 866   844   BUN 8 - 27 mg/dL 19   15   Creatinine 9.42 - 1.00 mg/dL 8.73   8.74   Sodium 865 - 144 mmol/L 136   136   Potassium 3.5 - 5.2 mmol/L 5.3   4.6   Chloride 96 - 106 mmol/L 100   101   CO2 20 - 29 mmol/L 23   24   Calcium  8.7 - 10.3 mg/dL 9.0  9.0  9.1   Total Protein 6.0 - 8.5 g/dL   7.6   Total Bilirubin 0.0 - 1.2 mg/dL   <9.7   Alkaline Phos 44 - 121 IU/L   100   AST 0 - 40 IU/L   17   ALT 0 - 32 IU/L   11    Lipid Panel     Component Value Date/Time   CHOL 130 12/06/2022 1123   TRIG 106 12/06/2022 1123   HDL 58 12/06/2022 1123   CHOLHDL 2.2 12/06/2022 1123   LDLCALC 53 12/06/2022 1123    CBC    Component Value Date/Time   WBC 10.4 02/16/2023 0953   WBC 10.3 04/19/2022 0824   RBC 4.02 02/16/2023 0953   RBC  3.70 (L) 04/19/2022 0824   HGB 11.8 02/16/2023 0953   HCT 36.7 02/16/2023 0953   PLT 464 (H) 02/16/2023 0953   MCV 91 02/16/2023 0953   MCH 29.4 02/16/2023 0953   MCH 29.7 04/19/2022 0824   MCHC 32.2 02/16/2023 0953   MCHC 31.6 04/19/2022 0824   RDW 12.9 02/16/2023 0953   LYMPHSABS 1.9 02/16/2023 0953   MONOABS 0.7 04/19/2022 0824   EOSABS 0.2 02/16/2023 0953   BASOSABS 0.1 02/16/2023 0953    ASSESSMENT AND PLAN: 1. Type 2 diabetes mellitus with obesity (HCC) (Primary) Not at goal. We discussed adding Rybelsus  or Ozempic  to insulin  to help with better diabetes control and also weight loss.  She would like to try the Rybelsus .  I went over with pt how the medication works and potential side effects including nausea, vomiting, diarrhea/constipation, bowel blockage, palpitations and pancreatitis.  Advised to stop the medicine and be seen if pt develops any abdominal pain, vomiting, severe diarrhea/constipation or palpitations. We will have to get PA Continue NPH/Reg 20 units a.m/10 units p.m - POCT glucose (manual entry) - POCT glycosylated hemoglobin (Hb A1C) - Semaglutide  (RYBELSUS ) 3  MG TABS; Take 1 tablet (3 mg total) by mouth daily.  Dispense: 30 tablet; Refill: 3 - Microalbumin / creatinine urine ratio - CBC - Comprehensive metabolic panel with GFR - Lipid panel  2. Insulin  long-term use (HCC) 3. Diabetes mellitus treated with oral medication (HCC) See # 1 above.  4. Hypertension associated with diabetes (HCC) At goal.  Continue Cozaar  50 mg daily and amlodipine  7.5 mg daily.  5. Hyperlipidemia associated with type 2 diabetes mellitus (HCC) Continue atorvastatin  20 mg half a tablet daily.  6. CKD stage 3a, GFR 45-59 ml/min (HCC) Stable. Avoid NSAIDs  7. Rectal bleeding - Ambulatory referral to Gastroenterology  8. Sinus congestion - Ambulatory referral to ENT   Patient was given the opportunity to ask questions.  Patient verbalized understanding of the plan and  was able to repeat key elements of the plan.   This documentation was completed using Paediatric nurse.  Any transcriptional errors are unintentional.  Orders Placed This Encounter  Procedures   Microalbumin / creatinine urine ratio   CBC   Comprehensive metabolic panel with GFR   Lipid panel   Ambulatory referral to Gastroenterology   Ambulatory referral to ENT   POCT glucose (manual entry)   POCT glycosylated hemoglobin (Hb A1C)     Requested Prescriptions   Signed Prescriptions Disp Refills   Semaglutide  (RYBELSUS ) 3 MG TABS 30 tablet 3    Sig: Take 1 tablet (3 mg total) by mouth daily.    Return in about 4 months (around 06/28/2024).  Barnie Louder, MD, FACP

## 2024-02-28 ENCOUNTER — Ambulatory Visit

## 2024-02-28 VITALS — BP 110/68 | HR 69 | Ht 66.0 in | Wt 199.0 lb

## 2024-02-28 DIAGNOSIS — M810 Age-related osteoporosis without current pathological fracture: Secondary | ICD-10-CM

## 2024-02-28 LAB — COMPREHENSIVE METABOLIC PANEL WITH GFR
ALT: 12 IU/L (ref 0–32)
AST: 20 IU/L (ref 0–40)
Albumin: 4.1 g/dL (ref 3.9–4.9)
Alkaline Phosphatase: 97 IU/L (ref 49–135)
BUN/Creatinine Ratio: 11 — ABNORMAL LOW (ref 12–28)
BUN: 16 mg/dL (ref 8–27)
Bilirubin Total: 0.2 mg/dL (ref 0.0–1.2)
CO2: 22 mmol/L (ref 20–29)
Calcium: 9.4 mg/dL (ref 8.7–10.3)
Chloride: 94 mmol/L — ABNORMAL LOW (ref 96–106)
Creatinine, Ser: 1.41 mg/dL — ABNORMAL HIGH (ref 0.57–1.00)
Globulin, Total: 3.6 g/dL (ref 1.5–4.5)
Glucose: 142 mg/dL — ABNORMAL HIGH (ref 70–99)
Potassium: 5 mmol/L (ref 3.5–5.2)
Sodium: 132 mmol/L — ABNORMAL LOW (ref 134–144)
Total Protein: 7.7 g/dL (ref 6.0–8.5)
eGFR: 40 mL/min/1.73 — ABNORMAL LOW (ref >59)

## 2024-02-28 LAB — CBC
Hematocrit: 38.2 % (ref 34.0–46.6)
Hemoglobin: 12.1 g/dL (ref 11.1–15.9)
MCH: 30.2 pg (ref 26.6–33.0)
MCHC: 31.7 g/dL (ref 31.5–35.7)
MCV: 95 fL (ref 79–97)
Platelets: 461 x10E3/uL — ABNORMAL HIGH (ref 150–450)
RBC: 4.01 x10E6/uL (ref 3.77–5.28)
RDW: 12.9 % (ref 11.7–15.4)
WBC: 11.4 x10E3/uL — ABNORMAL HIGH (ref 3.4–10.8)

## 2024-02-28 LAB — LIPID PANEL
Chol/HDL Ratio: 3.2 ratio (ref 0.0–4.4)
Cholesterol, Total: 185 mg/dL (ref 100–199)
HDL: 58 mg/dL (ref >39)
LDL Chol Calc (NIH): 102 mg/dL — ABNORMAL HIGH (ref 0–99)
Triglycerides: 143 mg/dL (ref 0–149)
VLDL Cholesterol Cal: 25 mg/dL (ref 5–40)

## 2024-02-28 LAB — MICROALBUMIN / CREATININE URINE RATIO
Creatinine, Urine: 44.6 mg/dL
Microalb/Creat Ratio: 11 mg/g{creat} (ref 0–29)
Microalbumin, Urine: 4.7 ug/mL

## 2024-02-28 MED ORDER — DENOSUMAB 60 MG/ML ~~LOC~~ SOSY: 60.0000 mg | PREFILLED_SYRINGE | SUBCUTANEOUS | Status: AC

## 2024-02-28 NOTE — Progress Notes (Signed)
 After obtaining consent, and per orders of Dr. Lonzo Cloud, injection of Prolia 60mg   given by Trevonte Ashkar L Davis Vannatter in right arm SQ. Patient instructed to remain in clinic for 20 minutes afterwards, and to report any adverse reaction to me immediately.

## 2024-02-29 ENCOUNTER — Other Ambulatory Visit: Payer: Self-pay | Admitting: Internal Medicine

## 2024-02-29 ENCOUNTER — Telehealth: Payer: Self-pay | Admitting: Internal Medicine

## 2024-02-29 ENCOUNTER — Other Ambulatory Visit: Payer: Self-pay

## 2024-02-29 ENCOUNTER — Ambulatory Visit: Payer: Self-pay | Admitting: Internal Medicine

## 2024-02-29 DIAGNOSIS — E871 Hypo-osmolality and hyponatremia: Secondary | ICD-10-CM

## 2024-02-29 MED ORDER — HUMULIN 70/30 KWIKPEN (70-30) 100 UNIT/ML ~~LOC~~ SUPN: PEN_INJECTOR | SUBCUTANEOUS | 11 refills | Status: DC

## 2024-02-29 NOTE — Progress Notes (Signed)
 Let pt know that she has mild elevation in white blood cell and platelet cell count. She has seen the blood specialist for this about 2 yrs ago. We will monitor for now. Kidney function not 100%. She sees the kidney specialist for this. Stay hydrated by drinking 4-6 glasses of water daily. Sodium level is low. This may be due to Donepezil  (Aricept ) the medication she is taking for memory. Would like for her to hold off on taking this medication for 1-2 weeks then return to the lab for recheck.  Cholesterol level has increased. Please confirm that she has been taking Atorvastatin  consistently every day. Is she taking the whole 20 mg pill or just 1/2 of it?

## 2024-02-29 NOTE — Telephone Encounter (Signed)
-----   Message from Burnard KANDICE Lot sent at 02/29/2024 11:18 AM EDT ----- Regarding: RE: [facility] This patient's enrollment in [facility] for Humulin  insulin  is active until 06/13/2024. The prescription for her Humulin  can be sent to [facility] in [place], [place] ----- Message ----- From: Vicci Barnie NOVAK, MD Sent: 02/29/2024  10:20 AM EDT To: Burnard KANDICE Lot, CPhT Subject: [facility]                                     Saw this pt yesterday. She told me she gets her insulin  through [facility] and wanted me to send the prescription there. I did not find this as a pharmacy to send prescription. It dawned on me that this may be a PAP that she is referring to.  Do you assist her with signing up for [facility]?

## 2024-02-29 NOTE — Telephone Encounter (Signed)
-----   Message from Burnard KANDICE Lot sent at 02/28/2024  8:55 AM EDT ----- Regarding: RE: Rybelsus  Rybelsus  processed on insurance for a $47 copay. I submitted an application for patient assistance with the manufacturer to hopefully receive free Rybelsus  for her in the future. ----- Message ----- From: Vicci Barnie NOVAK, MD Sent: 02/27/2024   6:23 PM EDT To: Burnard KANDICE Lot, CPhT Subject: Rybelsus                                        Please see if we can get prior approval.

## 2024-03-01 ENCOUNTER — Telehealth: Payer: Self-pay

## 2024-03-01 ENCOUNTER — Other Ambulatory Visit: Payer: Self-pay

## 2024-03-01 NOTE — Telephone Encounter (Signed)
 Received notification from [employer] regarding approval for RYBELSUS . Patient assistance approved from 02/29/2024 to 06/13/2024.  Medication will ship to [facility] [address], [id number]  Pt ID: [id number]  Company phone: [phone]  Waiting on shipment. May take up to 14 business days once refill is processed.

## 2024-03-03 LAB — HM DIABETES EYE EXAM

## 2024-03-05 ENCOUNTER — Other Ambulatory Visit

## 2024-03-05 ENCOUNTER — Ambulatory Visit: Admitting: Internal Medicine

## 2024-03-05 ENCOUNTER — Ambulatory Visit (INDEPENDENT_AMBULATORY_CARE_PROVIDER_SITE_OTHER): Admitting: Internal Medicine

## 2024-03-05 ENCOUNTER — Encounter: Payer: Self-pay | Admitting: Internal Medicine

## 2024-03-05 VITALS — BP 122/74 | HR 70 | Ht 66.0 in | Wt 203.0 lb

## 2024-03-05 DIAGNOSIS — N2581 Secondary hyperparathyroidism of renal origin: Secondary | ICD-10-CM

## 2024-03-05 DIAGNOSIS — E559 Vitamin D deficiency, unspecified: Secondary | ICD-10-CM | POA: Diagnosis not present

## 2024-03-05 DIAGNOSIS — M810 Age-related osteoporosis without current pathological fracture: Secondary | ICD-10-CM

## 2024-03-05 NOTE — Progress Notes (Signed)
 Name: Chelsea Coleman  MRN/ DOB: [id number], [birth year]    Age/ Sex: 68 y.o., female    PCP: Vicci Barnie NOVAK, MD   Reason for Endocrinology Evaluation: Osteoporosis     Date of Initial Endocrinology Evaluation: 03/29/2022    HPI: Ms. Chelsea Coleman is a 68 y.o. female with a past medical history of T2DM, HTN, CKD III and dyslipidemia. The patient presented for initial endocrinology clinic visit on 03/29/2022 for consultative assistance with her osteoporosis.    Interpreter line has been used ETTER Shape)  Pt was diagnosed with osteoporosis: November 2022 with a T score of -5 at the distal radius  Menarche at age : 53 Menopausal at age : She had total hysterectomy at age 76's Fracture Hx: no Hx of HRT: yes , short term  due to hirsutism  FH of osteoporosis or hip fracture: no Prior Hx of anti-estrogenic therapy :no Prior Hx of anti-resorptive therapy : Donella was recommended in July 2023 but this was cost prohibitive.  She was started on Prolia  05/04/2022  Of note the patient has had CKD III since at least 2020. She follows with nephrology   24-hour urinary calcium  was low at 56 mg / 24 hours Patient has been noted with secondary hyperparathyroidism which is multifactorial due to CKD and low calcium  intake     SUBJECTIVE:    Today (03/05/24):  Chelsea Coleman is here for follow-up on osteoporosis.     Interpretor line was used  Sherlean [id number]  Last Prolia  injection 02/28/2024 NO rash  NO recent falls  No bone fractures  No nausea or heartburn  No constipation or diarrhea    She is on calcium  tablets as well as through diet with mild and spinach  She is also on Vitamin D  2000 international unit daily    HISTORY:  Past Medical History:  Past Medical History:  Diagnosis Date   Cataract    states removed in 2014   Chronic kidney disease 02/14/2020   Stage 3    Diabetes mellitus without complication (HCC)    GERD  (gastroesophageal reflux disease)    Hypertension    Past Surgical History:  Past Surgical History:  Procedure Laterality Date   ABDOMINAL HYSTERECTOMY     fibroid   CHOLECYSTECTOMY     INCONTINENCE SURGERY     REFRACTIVE SURGERY  2017   BL    Social History:  reports that she has never smoked. She has never used smokeless tobacco. She reports that she does not drink alcohol and does not use drugs. Family History: family history includes Diabetes in her mother.   HOME MEDICATIONS: Allergies as of 03/05/2024   No Known Allergies      Medication List        Accurate as of March 05, 2024  8:12 AM. If you have any questions, ask your nurse or doctor.          Accu-Chek FastClix Lancets Misc Use to check blood sugar 3 TIMES DAILY.   Accu-Chek Guide test strip Generic drug: glucose blood Use to check blood sugar 3 TIMES DAILY.   Accu-Chek Guide w/Device Kit Use to check blood sugar 3 TIMES DAILY.   amLODipine  5 MG tablet Commonly known as: NORVASC  Take 1.5 tablets (7.5 mg total) by mouth daily.   aspirin  EC 81 MG tablet Take 1 tablet (81 mg total) by mouth daily. Swallow whole.   atorvastatin  20 MG tablet Commonly known as: LIPITOR  Take 1 tablet (20 mg total) by mouth daily.   clobetasol  0.05 % external solution Commonly known as: TEMOVATE  APPLY SMALL AMOUNT TO AFFECTED AREA 3 TIMES A WEEK AS NEEDED.   dapagliflozin  propanediol 5 MG Tabs tablet Commonly known as: FARXIGA  Tome 1 tableta (5 mg en total) por va oral diariamente. (Take 1 tablet (5 mg total) by mouth daily.)   donepezil  5 MG tablet Commonly known as: ARICEPT  Tome 1 tableta (5 mg en total) por va oral diariamente. (Take 1 tablet (5 mg total) by mouth daily.)   fluticasone  50 MCG/ACT nasal spray Commonly known as: FLONASE  Place 1 spray into both nostrils daily.   FreeStyle Libre 3 Plus Sensor Misc USE TO MONITOR BLOOD GLUCOSE CONTINOUSLY. CHANGE SENSOR EVERY 15 DAYS.   FreeStyle  Crescent 3 Reader [employer] se indica. (Use as directed.)   gabapentin  400 MG capsule Commonly known as: NEURONTIN  TAKE 1 CAPSULE BY MOUTH TWICE  DAILY   HumuLIN  70/30 KwikPen (70-30) 100 UNIT/ML KwikPen Generic drug: insulin  isophane & regular human KwikPen Inject 20-23 units subcutaneously with breakfast and 10 units with dinner.   losartan  50 MG tablet Commonly known as: COZAAR  Take 1 tablet (50 mg total) by mouth daily.   methocarbamol  500 MG tablet Commonly known as: ROBAXIN  Take 1 tablet (500 mg total) by mouth daily as needed (Medication can cause drowsiness).   omeprazole  20 MG capsule Commonly known as: PRILOSEC Tome 1 cpsula (20 mg en total) por va oral diariamente. (TAKE 1 CAPSULE (20 MG TOTAL) BY MOUTH DAILY.)   OVER THE COUNTER MEDICATION Take 1 capsule by mouth at bedtime.   Rybelsus  3 MG Tabs Generic drug: Semaglutide  Tome 1 tableta (3 mg en total) por va oral diariamente. (Take 1 tablet (3 mg total) by mouth daily.)   solifenacin  10 MG tablet Commonly known as: VESICARE  Tome 1 tableta (10 mg en total) por va oral diariamente. (Take 1 tablet (10 mg total) by mouth daily.)   topiramate  25 MG tablet Commonly known as: Topamax  Take 1 tablet (25 mg total) by mouth at bedtime as needed.   TRUEplus 5-Bevel Pen Needles 32G X 4 MM Misc Generic drug: Insulin  Pen Needle Use to inject insulin  twice daily.   TRUEplus Insulin  Syringe 31G X 5/16 0.3 ML Misc Generic drug: Insulin  Syringe-Needle U-100 Use to inject insulin  twice daily   Vitamin D  (Ergocalciferol ) 1.25 MG (50000 UNIT) Caps capsule Commonly known as: DRISDOL  Take 1 capsule (50,000 Units total) by mouth once a week.          REVIEW OF SYSTEMS: A comprehensive ROS was conducted with the patient and is negative except as per HPI    OBJECTIVE:  VS: BP 122/74 (BP Location: Left Arm, Patient Position: Sitting, Cuff Size: Normal)   Pulse 70   Ht 5' 6 (1.676 m)   Wt 203 lb (92.1 kg)    SpO2 98%   BMI 32.77 kg/m    Wt Readings from Last 3 Encounters:  03/05/24 203 lb (92.1 kg)  02/28/24 199 lb (90.3 kg)  02/27/24 199 lb (90.3 kg)     EXAM: General: Pt appears well and is in NAD  Lungs: Clear with good BS bilat with no rales, rhonchi, or wheezes  Heart: Auscultation: RRR.  Abdomen: Normoactive bowel sounds, soft, nontender, without masses or organomegaly palpable  Extremities:  BL LE: No pretibial edema  Mental Status: Judgment, insight: Intact Orientation: Oriented to time, place, and person Mood and affect: No depression, anxiety, or  agitation     DATA REVIEWED:    Latest Reference Range & Units 02/27/24 12:05  Sodium 134 - 144 mmol/L 132 (L)  Potassium 3.5 - 5.2 mmol/L 5.0  Chloride 96 - 106 mmol/L 94 (L)  CO2 20 - 29 mmol/L 22  Glucose 70 - 99 mg/dL 857 (H)  BUN 8 - 27 mg/dL 16  Creatinine 9.42 - 8.99 mg/dL 8.58 (H)  Calcium  8.7 - 10.3 mg/dL 9.4  BUN/Creatinine Ratio 12 - 28  11 (L)  eGFR >59 mL/min/1.73 40 (L)  Alkaline Phosphatase 49 - 135 IU/L 97  Albumin 3.9 - 4.9 g/dL 4.1  AST 0 - 40 IU/L 20  ALT 0 - 32 IU/L 12  Total Protein 6.0 - 8.5 g/dL 7.7  Total Bilirubin 0.0 - 1.2 mg/dL 0.2     DXA 88/07/7975  Site Region Measured Date Measured Age YA BMD Significant CHANGE T-score Left Forearm Radius 33% 04/20/2021 66.5 -5.0 0.444 g/cm2   DualFemur Neck Right 04/20/2021 66.5 -2.7 0.660 g/cm2   DualFemur Total Mean 04/20/2021 66.5 -2.3 0.720 g/cm2   Old records , labs and images have been reviewed.   ASSESSMENT/PLAN/RECOMMENDATIONS:   Osteoporosis  - Multifactorial given menopause in her 40's, low calcium  intake, chronic PPI use, and CKD III - Emphasized again the importance of optimizing calcium  ad vitamin D  intake for bone health  -Limited antiresorptive agents due to CKD III -I have recommended Evenity but unfortunately it is cost prohibitive with a co-pay of $475 - She is tolerating Prolia , we will continue - DEXA order has been  placed to be done at [facility] - Labs through PCPs office were reviewed with stable and low GFR, normal serum calcium  and hepatic function   Medications : Calcium  1200 mg daily   2. Secondary Hyperparathyroidism :  -Elevated PTH is secondary to multiple factors including CKD III and decrease calcium  absorption due to the low intake as well as low vitamin D    3.  Vitamin D  insufficiency:  - Continue vitamin D  to 2000 IU daily   F/U in  1 yr  Signed electronically by: Stefano Redgie Butts, MD  [facility] Endocrinology  [facility] [address] [place] [place], [place] [address] Phone: [phone] FAX: [phone]   CC: Vicci Barnie NOVAK, MD [address] [place] [address] [place] [address] Phone: [phone] Fax: [phone]   Return to Endocrinology clinic as below: Future Appointments  Date Time Provider Department Center  03/12/2024  9:30 AM [facility] LAB [facility] [address]  03/26/2024  9:00 AM Dina Camie BRAVO, PA-C [facility] None  07/02/2024 10:50 AM Vicci Barnie NOVAK, MD [facility] [address]  08/31/2024 10:00 AM [facility] NURSE [facility] None

## 2024-03-05 NOTE — Telephone Encounter (Signed)
 Due to recent policy changes, VOB has been re-submitted.

## 2024-03-11 NOTE — Telephone Encounter (Signed)
 Last Prolia  inj 02/28/24 Next Prolia  inj due 08/28/24

## 2024-03-11 NOTE — Telephone Encounter (Signed)
 Medical Buy and [employer]ell  Patient is ready for scheduling on or after 02/24/24  Out-of-pocket cost due at time of visit: $346.87  Primary: [facility] AARP Medicare Advantage HMO-POS  Prolia  co-insurance: 20% (approximately $331.87) Admin fee co-insurance: $15  Deductible: does not apply  Prior Authorization on file and valid PA# [id number] Valid: 05/07/23-05/06/24  Secondary: N/A Prolia  co-insurance:  Admin fee co-insurance:  Deductible:  Prior Auth:  PA# Valid:   ** This summary of benefits is an estimation of the patient's out-of-pocket cost. Exact cost may vary based on individual plan coverage.

## 2024-03-11 NOTE — Telephone Encounter (Signed)
 Medical Buy and Bill   PRIMARY COVERAGE DETAILS: Prolia  will be subject to 20% coinsurance up to a $3,900.00 out of pocket max  ($430.12 met). Once met, coverage increases to 100%. Administration is subject to $15 copay, which does  contribute to OOP max. No deductible applies. The benefits provided on this Verification of Benefits form  are Medical Benefits and are the patient's In-Network benefits. If you would like Pharmacy Benefits, please  call [phone].   SECONDARY - N/A

## 2024-03-12 ENCOUNTER — Ambulatory Visit: Attending: Internal Medicine

## 2024-03-12 ENCOUNTER — Other Ambulatory Visit: Payer: Self-pay

## 2024-03-12 DIAGNOSIS — E871 Hypo-osmolality and hyponatremia: Secondary | ICD-10-CM

## 2024-03-13 ENCOUNTER — Ambulatory Visit: Payer: Self-pay | Admitting: Internal Medicine

## 2024-03-14 ENCOUNTER — Other Ambulatory Visit: Payer: Self-pay

## 2024-03-16 ENCOUNTER — Other Ambulatory Visit: Payer: Self-pay | Admitting: Pharmacist

## 2024-03-16 ENCOUNTER — Other Ambulatory Visit: Payer: Self-pay

## 2024-03-16 MED ORDER — HUMULIN 70/30 KWIKPEN (70-30) 100 UNIT/ML ~~LOC~~ SUPN
PEN_INJECTOR | SUBCUTANEOUS | 11 refills | Status: DC
  Filled 2024-03-16: qty 30, 90d supply, fill #0
  Filled 2024-06-18: qty 15, 45d supply, fill #1
  Filled 2024-06-18: qty 30, 90d supply, fill #1

## 2024-03-16 MED ORDER — TRUEPLUS PEN NEEDLES 32G X 4 MM MISC
2 refills | Status: AC
  Filled 2024-03-16: qty 100, 50d supply, fill #0
  Filled 2024-06-18: qty 100, 50d supply, fill #1

## 2024-03-17 LAB — OSMOLALITY, URINE: Osmolality, Ur: 263 mosm/kg

## 2024-03-17 LAB — BASIC METABOLIC PANEL WITH GFR
BUN/Creatinine Ratio: 12 (ref 12–28)
BUN: 17 mg/dL (ref 8–27)
CO2: 23 mmol/L (ref 20–29)
Calcium: 9.3 mg/dL (ref 8.7–10.3)
Chloride: 99 mmol/L (ref 96–106)
Creatinine, Ser: 1.37 mg/dL — ABNORMAL HIGH (ref 0.57–1.00)
Glucose: 154 mg/dL — ABNORMAL HIGH (ref 70–99)
Potassium: 5.3 mmol/L — ABNORMAL HIGH (ref 3.5–5.2)
Sodium: 137 mmol/L (ref 134–144)
eGFR: 42 mL/min/1.73 — ABNORMAL LOW (ref >59)

## 2024-03-17 LAB — OSMOLALITY: Osmolality Meas: 289 mosm/kg (ref 280–301)

## 2024-03-19 ENCOUNTER — Ambulatory Visit: Attending: Family Medicine

## 2024-03-19 ENCOUNTER — Other Ambulatory Visit: Payer: Self-pay

## 2024-03-19 DIAGNOSIS — Z23 Encounter for immunization: Secondary | ICD-10-CM

## 2024-03-19 NOTE — Progress Notes (Signed)
Flu vaccine administered in right deltoid per protocols.  Information sheet given. Patient denies and pain or discomfort at injection site. Tolerated injection well no reaction.  

## 2024-03-21 ENCOUNTER — Ambulatory Visit: Admitting: Physician Assistant

## 2024-03-26 ENCOUNTER — Other Ambulatory Visit: Payer: Self-pay

## 2024-03-26 ENCOUNTER — Ambulatory Visit (INDEPENDENT_AMBULATORY_CARE_PROVIDER_SITE_OTHER)

## 2024-03-26 ENCOUNTER — Ambulatory Visit: Admitting: Physician Assistant

## 2024-03-26 ENCOUNTER — Ambulatory Visit: Payer: Medicare Other | Admitting: Internal Medicine

## 2024-03-26 VITALS — BP 150/97 | HR 86 | Ht 60.0 in | Wt 220.0 lb

## 2024-03-26 DIAGNOSIS — J3489 Other specified disorders of nose and nasal sinuses: Secondary | ICD-10-CM

## 2024-03-26 DIAGNOSIS — J342 Deviated nasal septum: Secondary | ICD-10-CM

## 2024-03-26 DIAGNOSIS — J343 Hypertrophy of nasal turbinates: Secondary | ICD-10-CM

## 2024-03-26 MED ORDER — AZELASTINE HCL 0.1 % NA SOLN
2.0000 | Freq: Two times a day (BID) | NASAL | 12 refills | Status: AC
  Filled 2024-03-26: qty 30, 50d supply, fill #0

## 2024-03-26 NOTE — Progress Notes (Unsigned)
 Dear Dr. Vicci, Here is my assessment for our mutual patient, Chelsea Coleman. Thank you for allowing me the opportunity to care for your patient. Please do not hesitate to contact me should you have any other questions. Sincerely, Dr. Hadassah Parody  Otolaryngology Clinic Note Referring provider: Dr. Vicci HPI:   Initial HPI (03/26/2024) Discussed the use of AI scribe software for clinical note transcription with the patient, who gave verbal consent to proceed. History of Present Illness Chelsea Coleman is a 68 year old female who presents with difficulty breathing through her nose.  Nasal obstruction and congestion - Difficulty breathing through the nose for the past 5 years, with symptoms worsening when lying down and worsening overall - Nasal congestion is generally bilateral, occasionally more pronounced on the right side - Congestion significantly affects sleep quality - Minimal relief with nasal spray use - Frequent application of Vaseline to nostrils due to dryness from constant nose blowing  Nasal drainage and dryness Allergies - Frequent clear, odorless nasal drainage - No associated pain - Dryness of the nostrils from constant nose blowing - History of allergies with symptoms worsening with pollen exposure - Takes Claritin  and receives allergy shots for allergy management  Associated symptoms and relevant negatives - No history of sinus infections or nasal trauma - No heart or lung problems - CKD3  Personal or FHx of bleeding dz or anesthesia difficulty: no   AP/AC: aspirin   PMH:  - CKD 3   Independent Review of Additional Tests or Records:  Referral 02/27/24 Chelsea Vicci, MD: sinus congestion, refer to ENT    PMH/Meds/All/SocHx/FamHx/ROS:   Past Medical History:  Diagnosis Date   Cataract    states removed in 2014   Chronic kidney disease 02/14/2020   Stage 3    Diabetes mellitus without complication (HCC)    GERD  (gastroesophageal reflux disease)    Hypertension      Past Surgical History:  Procedure Laterality Date   ABDOMINAL HYSTERECTOMY     fibroid   CHOLECYSTECTOMY     INCONTINENCE SURGERY     REFRACTIVE SURGERY  2017   BL    Family History  Problem Relation Age of Onset   Diabetes Mother    Colon cancer Neg Hx    Colon polyps Neg Hx    Esophageal cancer Neg Hx    Rectal cancer Neg Hx    Stomach cancer Neg Hx      Social Connections: Moderately Isolated (12/19/2023)   Social Connection and Isolation Panel    Frequency of Communication with Friends and Family: Three times a week    Frequency of Social Gatherings with Friends and Family: Once a week    Attends Religious Services: Never    Database administrator or Organizations: No    Attends Engineer, structural: Never    Marital Status: Married     Current Outpatient Medications  Medication Instructions   Accu-Chek FastClix Lancets MISC Use to check blood sugar 3 TIMES DAILY.   amLODipine  (NORVASC ) 7.5 mg, Oral, Daily   aspirin  EC 81 mg, Oral, Daily, Swallow whole.   atorvastatin  (LIPITOR) 20 mg, Oral, Daily   azelastine (ASTELIN) 0.1 % nasal spray 2 sprays, Each Nare, 2 times daily, Use in each nostril as directed   Blood Glucose Monitoring Suppl (ACCU-CHEK GUIDE ME) w/Device KIT Use to check blood sugar 3 TIMES DAILY.   clobetasol  (TEMOVATE ) 0.05 % external solution APPLY SMALL AMOUNT TO AFFECTED AREA 3 TIMES A  WEEK AS NEEDED.   Continuous Glucose Receiver (FREESTYLE LIBRE 3 READER) DEVI Use as directed.   Continuous Glucose Sensor (FREESTYLE LIBRE 3 PLUS SENSOR) MISC USE TO MONITOR BLOOD GLUCOSE CONTINOUSLY. CHANGE SENSOR EVERY 15 DAYS.   dapagliflozin  propanediol (FARXIGA ) 5 mg, Oral, Daily   donepezil  (ARICEPT ) 5 mg, Oral, Daily   fluticasone  (FLONASE ) 50 MCG/ACT nasal spray 1 spray, Each Nare, Daily   gabapentin  (NEURONTIN ) 400 mg, Oral, 2 times daily   glucose blood (ACCU-CHEK GUIDE) test strip Use to check  blood sugar 3 TIMES DAILY.   insulin  isophane & regular human KwikPen (HUMULIN  70/30 KWIKPEN) (70-30) 100 UNIT/ML KwikPen Inject 20-23 units subcutaneously with breakfast and 10 units with dinner.   Insulin  Pen Needle (TRUEPLUS PEN NEEDLES) 32G X 4 MM MISC Use to inject insulin  twice daily.   Insulin  Syringe-Needle U-100 (TRUEPLUS INSULIN  SYRINGE) 31G X 5/16 0.3 ML MISC Use to inject insulin  twice daily   losartan  (COZAAR ) 50 mg, Oral, Daily   methocarbamol  (ROBAXIN ) 500 mg, Oral, Daily PRN   omeprazole  (PRILOSEC) 20 MG capsule TAKE 1 CAPSULE (20 MG TOTAL) BY MOUTH DAILY.   OVER THE COUNTER MEDICATION 1 capsule, Daily at bedtime   Rybelsus  3 mg, Oral, Daily   solifenacin  (VESICARE ) 10 mg, Oral, Daily   topiramate  (TOPAMAX ) 25 mg, Oral, At bedtime PRN   Vitamin D  (Ergocalciferol ) (DRISDOL ) 50,000 Units, Oral, Weekly     Physical Exam:   BP (!) 150/97 (BP Location: Left Arm, Patient Position: Sitting)   Pulse 86   Ht 5' (1.524 m)   Wt 220 lb (99.8 kg)   SpO2 94%   BMI 42.97 kg/m   Salient findings:  CN II-XII intact Bilateral EAC clear and TM intact with well pneumatized middle ear spaces Anterior rhinoscopy: Septum deviated anteriorly; bilateral inferior turbinates with hypertrophy b/l Nasal endoscopy was indicated to better evaluate the nose and paranasal sinuses, given the patient's history and exam findings, and is detailed below. No lesions of oral cavity/oropharynx No obviously palpable neck masses/lymphadenopathy/thyromegaly No respiratory distress or stridor  Seprately Identifiable Procedures:  Prior to initiating any procedures, risks/benefits/alternatives were explained to the patient and verbal consent obtained.  PROCEDURE (03/26/2024): Bilateral Diagnostic Rigid Nasal Endoscopy Pre-procedure diagnosis: nasal obstruction Post-procedure diagnosis: same Indication: See pre-procedure diagnosis and physical exam above Complications: None apparent EBL: 0 mL Anesthesia:  Lidocaine 4% and topical decongestant was topically sprayed in each nasal cavity  Description of Procedure:  Patient was identified. A rigid 30 degree endoscope was utilized to evaluate the sinonasal cavities, mucosa, sinus ostia and turbinates and septum.  Overall, signs of mucosal inflammation are noted.  Also noted are significant swelling of bilateral IT, nasal septal deviation to right anteriorly and left posteriorly.  No mucopurulence, polyps, or masses noted.   Right Middle meatus: unable to fully visualize given significant swelling  Right SE Recess: clear Left MM: unable to fully visualize given significant swelling  Left SE Recess: clear Photodocumentation was obtained.  CPT CODE -- 68768 - Mod 25   Impression & Plans:  Chelsea Coleman is a 68 y.o. female with   1. Nasal obstruction   2. Hypertrophy of inferior nasal turbinate   3. 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 - Schedule septoplasty and turbinate reduction surgery on a Monday (transportation available only on Mondays) - pre-op anesthesia clearance  -  Discontinue phenylephrine nasal spray. - Start Astelin nasal spray  We discussed the goals of septoplasty and turbinate reduction, and expectations for postoperative management. Will plan to leave splints in place, and removal was also discussed. We also discussed nasal obstruction post-operatively until splints in place and pain management.  We discussed R/B/A including pain, infection, bleeding (~5% risk of operative visit for control), persistent symptoms, need for revision surgery, and other risks including damage to surrounding structures, septal perforation, and injury to skull base, anesthetic complications, among others.  We discussed use of nasal saline spray and nasal  saline irrigations post-operatively We also discussed use of intranasal steroid post-operatively until healing occurs Patient understands and is ready to proceed.   Allergic rhinitis Chronic allergic rhinitis with nasal congestion and rhinorrhea, partially managed with Claritin  and allergy shots. - Continue Claritin  and allergy shots. - Start astelin, stop phenylephrine nasal spray   See below regarding exact medications prescribed this encounter including dosages and route: Meds ordered this encounter  Medications   azelastine (ASTELIN) 0.1 % nasal spray    Sig: Place 2 sprays into both nostrils 2 (two) times daily. Use in each nostril as directed    Dispense:  30 mL    Refill:  12    Thank you for allowing me the opportunity to care for your patient. Please do not hesitate to contact me should you have any other questions.  Sincerely, Hadassah Parody, MD Otolaryngologist (ENT), [facility] Phone: [phone] Fax: [phone]

## 2024-03-29 ENCOUNTER — Telehealth: Payer: Self-pay | Admitting: Internal Medicine

## 2024-03-29 NOTE — Telephone Encounter (Signed)
 All faxes received from yesterday has been placed in the provider's box.

## 2024-03-29 NOTE — Telephone Encounter (Signed)
 Copied from CRM [id number]. Topic: Clinical - Medical Advice >> Mar 29, 2024 10:11 AM Delon DASEN wrote:  Reason for CRM: Montie with [facility] E[facility]- calling about fax regarding surgical clearance sent yesterday, need response- [phone]

## 2024-04-13 ENCOUNTER — Other Ambulatory Visit: Payer: Self-pay

## 2024-04-16 ENCOUNTER — Encounter: Payer: Self-pay | Admitting: Internal Medicine

## 2024-04-16 ENCOUNTER — Ambulatory Visit: Attending: Internal Medicine | Admitting: Internal Medicine

## 2024-04-16 ENCOUNTER — Telehealth: Payer: Self-pay | Admitting: Internal Medicine

## 2024-04-16 ENCOUNTER — Other Ambulatory Visit: Payer: Self-pay

## 2024-04-16 VITALS — BP 125/79 | HR 97 | Wt 196.2 lb

## 2024-04-16 DIAGNOSIS — E1159 Type 2 diabetes mellitus with other circulatory complications: Secondary | ICD-10-CM | POA: Diagnosis not present

## 2024-04-16 DIAGNOSIS — E119 Type 2 diabetes mellitus without complications: Secondary | ICD-10-CM

## 2024-04-16 DIAGNOSIS — Z01818 Encounter for other preprocedural examination: Secondary | ICD-10-CM | POA: Diagnosis not present

## 2024-04-16 DIAGNOSIS — N1831 Chronic kidney disease, stage 3a: Secondary | ICD-10-CM

## 2024-04-16 DIAGNOSIS — Z7984 Long term (current) use of oral hypoglycemic drugs: Secondary | ICD-10-CM

## 2024-04-16 DIAGNOSIS — E875 Hyperkalemia: Secondary | ICD-10-CM

## 2024-04-16 DIAGNOSIS — I152 Hypertension secondary to endocrine disorders: Secondary | ICD-10-CM

## 2024-04-16 NOTE — Telephone Encounter (Signed)
 Pt was suppose to stop at lab today for recheck potassium/sodium. Please ask her to come to lab to have done prior to her surgery.

## 2024-04-16 NOTE — Patient Instructions (Signed)
  VISIT SUMMARY: Today, you came in for a preoperative evaluation for your upcoming nasal passage surgery. We discussed your medical history, current medications, and concerns about anesthesia. We also reviewed your blood sugar levels and other health conditions.  YOUR PLAN: -PREOPERATIVE EVALUATION: You are preparing for nasal passage surgery and have no history of complications with anesthesia. Your blood pressure is well-controlled, and you do not take aspirin  daily. We will perform an EKG and check your potassium and sodium levels before the surgery. Please stop taking Rybelsus  one week before your surgery.  -TYPE 2 DIABETES MELLITUS: Your blood sugar levels have been unstable, ranging from 69 to 199 mg/dL. You have been out of Rybelsus  for three days. We will refill your Rybelsus  prescription, but you should stop taking it one week before your surgery.  -CHRONIC KIDNEY DISEASE, STAGE 3A: Your kidney function is stable, which means your kidneys are working at a consistent level.  -HYPERTENSION: Your blood pressure is well-controlled with your current medications, losartan  50 mg and amlodipine  1.5 tablets daily.  -COGNITIVE CONCERNS: You stopped taking Aricept  due to low sodium levels. We will recheck your sodium and potassium levels to monitor your condition.  INSTRUCTIONS: Please follow up with the lab to get an EKG and have your potassium and sodium levels checked before your surgery. Remember to stop taking Rybelsus  one week before your surgery.                      Contains text generated by Abridge.                                 Contains text generated by Abridge.

## 2024-04-16 NOTE — Progress Notes (Addendum)
 Patient ID: Chelsea Coleman, female    DOB: 1956/03/24  MRN: [id number]  CC: surgical clearance   Subjective: Chelsea Coleman is a 68 y.o. female who presents for chronic ds management. Her concerns today include:  Pt with hx of DM neuropathy and retinopathy, HTN, HL, obesity, anemia of chronic ds, thrombocytosis/leukocytosis (followed by hematology), CKD 3b, ACD, Vit D, osteoporosis (Dr. 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    [employer] Language interpreter used during this encounter. # Chelsea Coleman, Chelsea Coleman  Discussed the use of AI scribe software for clinical note transcription with the patient, who gave verbal consent to proceed.  History of Present Illness Chelsea Coleman is a 68 year old female who presents for a preoperative evaluation for nasal passage surgery.  She will have septoplasty and inferior turbinate reduction under anesthesia by Dr. Greggory.  She has a history of previous surgeries, including a hysterectomy and gallbladder removal approximately ten years ago, with no complications from general anesthesia.  She is able to walk several blocks without experiencing chest pain or significant shortness of breath. She exercises occasionally, primarily walking, and does not have stairs in her home.  Her current medications include losartan  50 mg and amlodipine  1.5 tablets daily for hypertension. Reports compliance.   For diabetes, she takes Rybelsus  3 mg once daily, though she has run out of it for three days, Farxiga  5 mg daily and  70/30 insulin  twice a day. Last A1C in Sept was 7.8. At that visit we added Rybelsus . She has CGM but forgot to bring it with her today. She reports BS readings have been 69-199. Denies frequent hypoglycemia.   She has CKD stage III with GFR's ranging from 47-40 over  the past year.  Most recent GFR was 42.  She was found to have low sodium due to Aricept .  We had her stop this medication in September.  Potassium level was slightly elevated.  Advised to avoid potassium rich foods like bananas oranges and orange juice.  She has run out of VESIcare  10 mg for urinary incontinence but has refills available.  She is concerned about not getting enough oxygen to her brain.    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 12/28/2021   Mixed stress and urge urinary incontinence 12/28/2021   Class 2 severe obesity with serious comorbidity and body mass index (BMI) of 35.0 to 35.9 in adult 12/28/2021   Secondary hyperparathyroidism 12/02/2021   Age related osteoporosis 11/30/2021   Osteoporosis of multiple sites 04/21/2021   Leukocytosis 11/18/2020   Thrombocytosis 11/18/2020   Hypertension associated with stage 3 chronic kidney disease due to type 2 diabetes mellitus (HCC) 11/03/2020   Obesity (BMI 35.0-39.9 without comorbidity) 11/03/2020   Hyperlipidemia associated with type 2 diabetes mellitus (HCC) 04/06/2019   Gastroesophageal reflux disease without esophagitis 04/06/2019   Type 2 diabetes, controlled, with peripheral neuropathy (HCC) 01/26/2019   Diabetic retinopathy associated with controlled type 2 diabetes mellitus (HCC) 01/26/2019   Essential hypertension 01/26/2019   Anemia 01/26/2019   Drug-induced constipation 01/26/2019   Memory impairment 01/26/2019   Positive depression screening 01/26/2019     Current Outpatient Medications on File Prior to Visit  Medication Sig Dispense Refill   Accu-Chek FastClix Lancets MISC Use to check blood sugar 3 TIMES DAILY. 100 each 2   amLODipine  (NORVASC ) 5 MG tablet Take 1.5 tablets (7.5 mg total) by mouth daily.  135 tablet 1   aspirin  EC 81 MG tablet Take 1 tablet (81 mg total) by mouth daily. Swallow whole. 100 tablet 1   atorvastatin  (LIPITOR) 20 MG tablet Take 1 tablet (20 mg total) by mouth daily. 90 tablet 3   azelastine (ASTELIN) 0.1 % nasal spray Place 2 sprays into both nostrils 2 (two) times daily. Use in each nostril as directed 30 mL 12   Blood Glucose Monitoring Suppl (ACCU-CHEK GUIDE ME) w/Device KIT Use to check blood sugar 3 TIMES DAILY. 1 kit 0   clobetasol  (TEMOVATE ) 0.05 % external solution APPLY SMALL AMOUNT TO AFFECTED AREA 3 TIMES A WEEK AS NEEDED. 50 mL 1   Continuous Glucose Receiver (FREESTYLE LIBRE 3 READER) DEVI Use as directed. 1 each 0   Continuous Glucose Sensor (FREESTYLE LIBRE 3 PLUS SENSOR) MISC USE TO MONITOR BLOOD GLUCOSE CONTINOUSLY. CHANGE SENSOR EVERY 15 DAYS. 2 each 6   dapagliflozin  propanediol (FARXIGA ) 5 MG TABS tablet Take 1 tablet (5 mg total) by mouth daily. 30 tablet 11   ergocalciferol  (VITAMIN D2) 1.25 MG (50000 UT) capsule Take 1 capsule (50,000 Units total) by mouth once a week. 4 capsule 1   fluticasone  (FLONASE ) 50 MCG/ACT nasal spray Place 1 spray into both nostrils daily. 16 g 0   gabapentin  (NEURONTIN ) 400 MG capsule TAKE 1 CAPSULE BY MOUTH TWICE  DAILY 200 capsule 2   glucose blood (ACCU-CHEK GUIDE) test strip Use to check blood sugar 3 TIMES DAILY. 100 each 2   insulin  isophane & regular human KwikPen (HUMULIN  70/30 KWIKPEN) (70-30) 100 UNIT/ML KwikPen Inject 20-23 units subcutaneously with breakfast and 10 units with dinner. 15 mL 11   Insulin  Pen Needle (TRUEPLUS PEN NEEDLES) 32G X 4 MM MISC Use to inject insulin  twice daily. 100 each 2   Insulin  Syringe-Needle U-100 (TRUEPLUS INSULIN  SYRINGE) 31G X 5/16 0.3 ML MISC Use to inject insulin  twice daily 100 each 2   losartan  (COZAAR ) 50 MG tablet Take 1 tablet (50 mg total) by mouth daily. 90 tablet 1   methocarbamol  (ROBAXIN ) 500 MG tablet Take 1 tablet (500 mg total) by mouth daily as needed  (Medication can cause drowsiness). 30 tablet 1   omeprazole  (PRILOSEC) 20 MG capsule TAKE 1 CAPSULE (20 MG TOTAL) BY MOUTH DAILY. 30 capsule 3   OVER THE COUNTER MEDICATION Take 1 capsule by mouth at bedtime.     Semaglutide  (RYBELSUS ) 3 MG TABS Take 1 tablet (3 mg total) by mouth daily. 30 tablet 3   solifenacin  (VESICARE ) 10 MG tablet Take 1 tablet (10 mg total) by mouth daily. 30 tablet 11   topiramate  (TOPAMAX ) 25 MG tablet Take 1 tablet (25 mg total) by mouth at bedtime as needed. 90 tablet 1   Current Facility-Administered Medications on File Prior to Visit  Medication Dose Route Frequency Provider Last Rate Last Admin   [START ON 08/26/2024] denosumab  (PROLIA ) injection 60 mg  60 mg Subcutaneous Q6 months Shamleffer, Ibtehal Jaralla, MD        No Known Allergies  Social History   Socioeconomic History   Marital status: Married    Spouse name: Not on file   Number of children: 1   Years of education: 12 grade   Highest education level: Not on file  Occupational  History   Occupation: unemployed  Tobacco Use   Smoking status: Never   Smokeless tobacco: Never  Vaping Use   Vaping status: Never Used  Substance and Sexual Activity   Alcohol use: Never   Drug use: Never   Sexual activity: Yes    Birth control/protection: Post-menopausal  Other Topics Concern   Not on file  Social History Narrative   Right handed   Lives with husband   spanish   Social Drivers of Health   Financial Resource Strain: Medium Risk (12/19/2023)   Overall Financial Resource Strain (CARDIA)    Difficulty of Paying Living Expenses: Somewhat hard  Food Insecurity: No Food Insecurity (12/19/2023)   Hunger Vital Sign    Worried About Running Out of Food in the Last Year: Never true    Ran Out of Food in the Last Year: Never true  Transportation Needs: Unmet Transportation Needs (12/19/2023)   PRAPARE - Administrator, Civil Service (Medical): Yes    Lack of Transportation (Non-Medical):  No  Physical Activity: Inactive (12/19/2023)   Exercise Vital Sign    Days of Exercise per Week: 0 days    Minutes of Exercise per Session: 0 min  Stress: No Stress Concern Present (12/19/2023)   [employer] of Occupational Health - Occupational Stress Questionnaire    Feeling of Stress: Not at all  Social Connections: Moderately Isolated (12/19/2023)   Social Connection and Isolation Panel    Frequency of Communication with Friends and Family: Three times a week    Frequency of Social Gatherings with Friends and Family: Once a week    Attends Religious Services: Never    Database Administrator or Organizations: No    Attends Banker Meetings: Never    Marital Status: Married  Catering Manager Violence: Not At Risk (12/19/2023)   Humiliation, Afraid, Rape, and Kick questionnaire    Fear of Current or Ex-Partner: No    Emotionally Abused: No    Physically Abused: No    Sexually Abused: No    Family History  Problem Relation Age of Onset   Diabetes Mother    Colon cancer Neg Hx    Colon polyps Neg Hx    Esophageal cancer Neg Hx    Rectal cancer Neg Hx    Stomach cancer Neg Hx     Past Surgical History:  Procedure Laterality Date   ABDOMINAL HYSTERECTOMY     fibroid   CHOLECYSTECTOMY     INCONTINENCE SURGERY     REFRACTIVE SURGERY  2017   BL    ROS: Review of Systems Negative except as stated above  PHYSICAL EXAM: BP 125/79 (BP Location: Right Arm, Patient Position: Sitting, Cuff Size: Normal)   Pulse 97   Wt 196 lb 3.2 oz (89 kg)   SpO2 98%   BMI 38.32 kg/m   Wt Readings from Last 3 Encounters:  04/16/24 196 lb 3.2 oz (89 kg)  03/26/24 220 lb (99.8 kg)  03/05/24 203 lb (92.1 kg)    Physical Exam  General appearance - alert, well appearing, and in no distress Mental status - normal mood, behavior, speech, dress, motor activity, and thought processes Eyes - pink conjunctiva Mouth - mucous membranes moist, pharynx normal without lesions Neck -  supple, no significant adenopathy Lymphatics - no palpable lymphadenopathy, no hepatosplenomegaly Chest - clear to auscultation, no wheezes, rales or rhonchi, symmetric air entry Heart - normal rate, regular rhythm, normal S1, S2, no murmurs, rubs, clicks  or gallops Abdomen - soft, nontender, nondistended, no masses or organomegaly Extremities - no LE edema   EKG: NSR, nl EKG.      Latest Ref Rng & Units 03/12/2024    9:43 AM 02/27/2024   12:05 PM 04/13/2023   10:09 AM  CMP  Glucose 70 - 99 mg/dL 845  857  866   BUN 8 - 27 mg/dL 17  16  19    Creatinine 0.57 - 1.00 mg/dL 8.62  8.58  8.73   Sodium 134 - 144 mmol/L 137  132  136   Potassium 3.5 - 5.2 mmol/L 5.3  5.0  5.3   Chloride 96 - 106 mmol/L 99  94  100   CO2 20 - 29 mmol/L 23  22  23    Calcium  8.7 - 10.3 mg/dL 9.3  9.4  9.0   Total Protein 6.0 - 8.5 g/dL  7.7    Total Bilirubin 0.0 - 1.2 mg/dL  0.2    Alkaline Phos 49 - 135 IU/L  97    AST 0 - 40 IU/L  20    ALT 0 - 32 IU/L  12     Lipid Panel     Component Value Date/Time   CHOL 185 02/27/2024 1205   TRIG 143 02/27/2024 1205   HDL 58 02/27/2024 1205   CHOLHDL 3.2 02/27/2024 1205   LDLCALC 102 (H) 02/27/2024 1205    CBC    Component Value Date/Time   WBC 11.4 (H) 02/27/2024 1205   WBC 10.3 04/19/2022 0824   RBC 4.01 02/27/2024 1205   RBC 3.70 (L) 04/19/2022 0824   HGB 12.1 02/27/2024 1205   HCT 38.2 02/27/2024 1205   PLT 461 (H) 02/27/2024 1205   MCV 95 02/27/2024 1205   MCH 30.2 02/27/2024 1205   MCH 29.7 04/19/2022 0824   MCHC 31.7 02/27/2024 1205   MCHC 31.6 04/19/2022 0824   RDW 12.9 02/27/2024 1205   LYMPHSABS 1.9 02/16/2023 0953   MONOABS 0.7 04/19/2022 0824   EOSABS 0.2 02/16/2023 0953   BASOSABS 0.1 02/16/2023 0953    ASSESSMENT AND PLAN: 1. Preoperative evaluation of a medical condition to rule out surgical contraindications (TAR required) (Primary) Patient scheduled to undergo low risk surgery.  She has several cardiovascular risk factors  which are stable at this time.  Barring any significant abnormalities on chemistry she can proceed with surgery.  Advised that she would need to stop the Rybelsus  which is a GLP-1 agonist 1 week prior to surgery.  Should stop aspirin  about 5 days prior. - EKG 12-Lead  2. Hypertension associated with diabetes (HCC) At goal. - Basic Metabolic Panel  3. Diabetes mellitus treated with oral medication (HCC) Most recent A1c less than 8.  Patient to pick up refill on the Rybelsus .  She will continue Humulin  70/30 insulin  20 units with breakfast and 10 units with dinner and Farxiga .  4. CKD stage 3b,  (HCC) Stable No NSAIDS  5. Hyperkalemia Recheck chem today    Patient was given the opportunity to ask questions.  Patient verbalized understanding of the plan and was able to repeat key elements of the plan.   This documentation was completed using Paediatric nurse.  Any transcriptional errors are unintentional.  Orders Placed This Encounter  Procedures   Basic Metabolic Panel   EKG 12-Lead     Requested Prescriptions    No prescriptions requested or ordered in this encounter    No follow-ups on file.  Barnie Louder,  MD, GENI

## 2024-04-18 ENCOUNTER — Other Ambulatory Visit: Payer: Self-pay

## 2024-04-18 NOTE — Telephone Encounter (Signed)
 Called & spoke to the patient. Verified name & DOB. Informed that patient needs to come in for lab work prior to surgery. Patient stated that her surgery has not been scheduled and would like to come next Monday 04/23/24 because that is the only day her husband is off to bring her to any appointment. FYI

## 2024-04-19 ENCOUNTER — Other Ambulatory Visit: Payer: Self-pay | Admitting: Pharmacist

## 2024-04-19 ENCOUNTER — Other Ambulatory Visit: Payer: Self-pay

## 2024-04-19 NOTE — Progress Notes (Signed)
 Pharmacy Quality Measure Review  This patient is appearing on a report for being at risk of failing the adherence measure for cholesterol (statin) medications this calendar year.   Medication: atorvastatin   Last fill date: 02/07/2024 for 90 day supply  Insurance report was not up to date. No action needed at this time. Of note, will set reminder for next refill due on 05/07/2024.   Herlene Fleeta Morris, PharmD, JAQUELINE, CPP Clinical Pharmacist [facility] [phone]

## 2024-04-23 ENCOUNTER — Ambulatory Visit: Attending: Internal Medicine

## 2024-04-23 ENCOUNTER — Other Ambulatory Visit: Payer: Self-pay

## 2024-04-24 LAB — BASIC METABOLIC PANEL WITH GFR
BUN/Creatinine Ratio: 13 (ref 12–28)
BUN: 21 mg/dL (ref 8–27)
CO2: 21 mmol/L (ref 20–29)
Calcium: 8.8 mg/dL (ref 8.7–10.3)
Chloride: 96 mmol/L (ref 96–106)
Creatinine, Ser: 1.65 mg/dL — ABNORMAL HIGH (ref 0.57–1.00)
Glucose: 159 mg/dL — ABNORMAL HIGH (ref 70–99)
Potassium: 4.6 mmol/L (ref 3.5–5.2)
Sodium: 134 mmol/L (ref 134–144)
eGFR: 33 mL/min/1.73 — ABNORMAL LOW (ref >59)

## 2024-04-25 ENCOUNTER — Ambulatory Visit: Payer: Self-pay | Admitting: Internal Medicine

## 2024-05-04 NOTE — Progress Notes (Signed)
 Assessment and Plan Assessment & Plan           Recommendations:   Follow up in   months. Recommend good control of cardiovascular risk factors Continue to control mood as per PCP Proceed with MRI of the brain for further evaluation of any structural abnormalities and vascular load (to date she has not had that performed) continue vitamin D  replenishment Follow thrombocytosis and leukocytosis with oncology Recommend using the CPAP on a regular basis and follow with pulmonology Follow-up retinal detachment with ophthalmology     Chelsea Coleman de Stepanie Coleman is a very pleasant 68 y.o. 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 Patient is on donepezil  5 mg daily.This patient is here alone***  who supplements the history. Previous records as well as any outside records available were reviewed prior to todays visit.   Patient was last seen on 09/19/2023***.   Discussed the use of AI scribe software for clinical note transcription with the patient, who gave verbal consent to proceed.  History of Present Illness      Any changes in memory since last visit? . repeats oneself?  Endorsed Disoriented when walking into a room?  Patient denies ***  Misplacing objects?  Patient denies   Wandering behavior?   Denies. Any personality changes since last visit? Denies.   Any worsening depression?: denies.   Hallucinations or paranoia?  Denies.   Seizures?   Denies.    Any sleep changes? Sleeps well***. Does not sleep very well***.   Denies vivid dreams, REM behavior or sleepwalking   Sleep apnea?   denies ***  Any hygiene concerns?   Denies.   Independent of bathing and dressing?   Endorsed  Does the patient needs help with medications? Patient is in charge *** Who is in charge of the finances?  Patient is in charge   *** Any changes in appetite?  denies ***   Patient have trouble swallowing?  Denies.   Does the patient cook?  Any kitchen accidents such as leaving the stove on?   Denies.   Any headaches?    Denies.   Vision changes? Denies. Chronic pain?  Denies.   Ambulates with difficulty?    Denies. ***  Recent falls or head injuries?    Denies.      Unilateral weakness, numbness or tingling?  Denies.   Any tremors?  Denies.   Any anosmia?    Denies.   Any incontinence of urine?  Denies.   Any bowel dysfunction?  Denies.      Patient lives .*** Does the patient drive?***   Initial visit 06/20/2023 How long did patient have memory difficulties? She denies any issues. I don't know why I am here. She denies difficulty remembering new information, conversations and names.  Long-term memory is good. repeats oneself?  Endorsed Disoriented when walking into a room?  Patient denies  Leaving objects in unusual places? Endorsed, sometimes she leaves things and then she cannot remember but she states my husband moved it.   Wandering behavior?  denies .  Any personality changes?  I am more irritable than before  Any history of depression?:  In the past, she admits to depression, seen psychologists while living in [place] after  the death of her first husband.  Hallucinations or paranoia?  Denies.   Seizures?  Denies    Any sleep changes?   Sleeps well,only gets up when needing to urinate at night. Denies vivid dreams, REM behavior or sleepwalking   Sleep apnea?  Endorsed, non compliant with CPAP. I just use it occasionally when I needed.  Any hygiene concerns?  Denies   Independent of bathing and dressing?  Endorsed  Does the patient needs help with medications? Patient is in charge   Who is in charge of the finances? Her husband is in charge     Any changes in  appetite?  Denies     Patient have trouble swallowing? Denies.   Does the patient cook? No, her husband works in [employer] and he does the cooking Any kitchen accidents such as leaving the stove on? Denies.   Any history of headaches?  Endorsed, she has a of migraine headaches with aura, takes Topamax  as needed. She does not have these headaches frequently.  Chronic pain ?  Endorsed, she has a history of DDD of the lumbar spine, takes muscle relaxers and gabapentin  Ambulates with difficulty?  Denies.  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   Recent falls or head injuries? Denies.   Vision changes? She had a history of retinal detachment for the last 15 years, requiring surgery, sees ophthalmology.  Unilateral weakness, numbness or tingling? She has diabetic neuropathy, denies any one sided weakness or one sided numbness.   Any tremors?   Denies.   Any anosmia?  Denies.   Any incontinence of urine?  Endorsed.  She had recent urinary dysfunction with reported hematuria for the last 2 months and is scheduled for cystoscopy soon (although the source is unclear if it is GU or GI).  Of note, CT of the AP negative for abnormalities. She is concerned that it may be from vaginal source.  Any bowel dysfunction?  Chronic constipation.      Patient lives with her husband  History of heavy alcohol intake? Denies.  Never did History of heavy tobacco use? Denies.  Never smoked  Family history of dementia? Denies.  Does patient drive? Doe snot drive due to vision problems    Past Medical History:  Diagnosis Date   Cataract    states removed in 2014   Chronic kidney disease 02/14/2020   Stage 3    Diabetes mellitus without complication (HCC)    GERD (gastroesophageal reflux disease)    Hypertension       Past Surgical History:  Procedure Laterality Date   ABDOMINAL HYSTERECTOMY     fibroid   CHOLECYSTECTOMY     INCONTINENCE SURGERY     REFRACTIVE SURGERY  2017   BL     PREVIOUS MEDICATIONS:   CURRENT MEDICATIONS:  Outpatient Encounter Medications as of 05/07/2024  Medication Sig   Accu-Chek FastClix Lancets MISC Use to check blood sugar 3 TIMES DAILY.   amLODipine  (NORVASC ) 5 MG tablet Take 1.5 tablets (7.5 mg total) by mouth daily.   aspirin  EC 81 MG tablet Take 1 tablet (81 mg total) by mouth daily. Swallow whole.   atorvastatin  (LIPITOR) 20 MG tablet Take 1 tablet (20 mg total) by mouth daily.   azelastine  (ASTELIN ) 0.1 % nasal spray Place 2 sprays into both nostrils 2 (  two) times daily. Use in each nostril as directed   Blood Glucose Monitoring Suppl (ACCU-CHEK GUIDE ME) w/Device KIT Use to check blood sugar 3 TIMES DAILY.   clobetasol  (TEMOVATE ) 0.05 % external solution APPLY SMALL AMOUNT TO AFFECTED AREA 3 TIMES A WEEK AS NEEDED.   Continuous Glucose Receiver (FREESTYLE LIBRE 3 READER) DEVI Use as directed.   Continuous Glucose Sensor (FREESTYLE LIBRE 3 PLUS SENSOR) MISC USE TO MONITOR BLOOD GLUCOSE CONTINOUSLY. CHANGE SENSOR EVERY 15 DAYS.   dapagliflozin  propanediol (FARXIGA ) 5 MG TABS tablet Take 1 tablet (5 mg total) by mouth daily.   ergocalciferol  (VITAMIN D2) 1.25 MG (50000 UT) capsule Take 1 capsule (50,000 Units total) by mouth once a week.   fluticasone  (FLONASE ) 50 MCG/ACT nasal spray Place 1 spray into both nostrils daily.   gabapentin  (NEURONTIN ) 400 MG capsule TAKE 1 CAPSULE BY MOUTH TWICE  DAILY   glucose blood (ACCU-CHEK GUIDE) test strip Use to check blood sugar 3 TIMES DAILY.   insulin  isophane & regular human KwikPen (HUMULIN  70/30 KWIKPEN) (70-30) 100 UNIT/ML KwikPen Inject 20-23 units subcutaneously with breakfast and 10 units with dinner.   Insulin  Pen Needle (TRUEPLUS PEN NEEDLES) 32G X 4 MM MISC Use to inject insulin  twice daily.   Insulin   Syringe-Needle U-100 (TRUEPLUS INSULIN  SYRINGE) 31G X 5/16 0.3 ML MISC Use to inject insulin  twice daily   losartan  (COZAAR ) 50 MG tablet Take 1 tablet (50 mg total) by mouth daily.   methocarbamol  (ROBAXIN ) 500 MG tablet Take 1 tablet (500 mg total) by mouth daily as needed (Medication can cause drowsiness).   omeprazole  (PRILOSEC) 20 MG capsule TAKE 1 CAPSULE (20 MG TOTAL) BY MOUTH DAILY.   OVER THE COUNTER MEDICATION Take 1 capsule by mouth at bedtime.   Semaglutide  (RYBELSUS ) 3 MG TABS Take 1 tablet (3 mg total) by mouth daily.   solifenacin  (VESICARE ) 10 MG tablet Take 1 tablet (10 mg total) by mouth daily.   topiramate  (TOPAMAX ) 25 MG tablet Take 1 tablet (25 mg total) by mouth at bedtime as needed.   Facility-Administered Encounter Medications as of 05/07/2024  Medication   [START ON 08/26/2024] denosumab  (PROLIA ) injection 60 mg    Results      Objective:     PHYSICAL EXAMINATION:    VITALS:  There were no vitals filed for this visit.  GEN:  The patient appears stated age and is in NAD. HEENT:  Normocephalic, atraumatic.   Neurological examination:  General: NAD, well-groomed, appears stated age. Orientation: The patient is alert. Oriented to person, place and not to date.*** Cranial nerves: There is good facial symmetry.The speech is fluent and clear. No aphasia or dysarthria. Fund of knowledge is appropriate. Recent memory impaired and remote memory is normal.  Attention and concentration are normal.  Able to name objects and repeat phrases.  Hearing is intact to conversational tone ***.   Delayed recall *** Sensation: Sensation is intact to light touch throughout Motor: Strength is at least antigravity x4. DTR's 2/4 in UE/LE      06/20/2023   10:00 AM  Montreal Cognitive Assessment   Visuospatial/ Executive (0/5) 2  Naming (0/3) 3  Attention: Read list of digits (0/2) 1  Attention: Read list of letters (0/1) 1  Attention: Serial 7 subtraction starting at 100  (0/3) 3  Language: Repeat phrase (0/2) 1  Language : Fluency (0/1) 0  Abstraction (0/2) 1  Delayed Recall (0/5) 4  Orientation (0/6) 6  Total 22  Adjusted Score (based on  education) 23       12/19/2023    9:04 AM 08/06/2022    9:08 AM 02/23/2021    3:05 PM  MMSE - Mini Mental State Exam  Orientation to time 4 5 5   Orientation to Place 3 1 5   Registration 3 3 3   Attention/ Calculation 4 5 5   Recall 3 3 3   Language- name 2 objects 2 2 2   Language- repeat 0 1 1  Language- follow 3 step command 3 3 3   Language- read & follow direction 1 1 1   Write a sentence 1 1 1   Copy design 1 1 1   Total score 25 26 30        Movement examination: Tone: There is normal tone in the UE/LE Abnormal movements:  no tremor.  No myoclonus.  No asterixis.   Coordination:  There is no decremation with RAM's. Normal finger to nose  Gait and Station: The patient has no difficulty arising out of a deep-seated chair without the use of the hands. The patient's stride length is good.  Gait is cautious and narrow.   Thank you for allowing us  the opportunity to participate in the care of this nice patient. Please do not hesitate to contact us  for any questions or concerns.   Total time spent on today's visit was *** minutes dedicated to this patient today, preparing to see patient, examining the patient, ordering tests and/or medications and counseling the patient, documenting clinical information in the EHR or other health record, independently interpreting results and communicating results to the patient/family, discussing treatment and goals, answering patient's questions and coordinating care.  Cc:  Vicci Barnie NOVAK, MD  Camie Sevin 05/04/2024 7:21 AM

## 2024-05-07 ENCOUNTER — Encounter: Payer: Self-pay | Admitting: Physician Assistant

## 2024-05-07 ENCOUNTER — Other Ambulatory Visit: Payer: Self-pay

## 2024-05-07 ENCOUNTER — Ambulatory Visit: Admitting: Physician Assistant

## 2024-05-07 VITALS — BP 129/78 | HR 86 | Ht 61.0 in | Wt 200.0 lb

## 2024-05-07 DIAGNOSIS — R413 Other amnesia: Secondary | ICD-10-CM | POA: Diagnosis not present

## 2024-05-07 NOTE — Patient Instructions (Signed)
 It was a pleasure to see you today at our office.   Recommendations:    MRI of the brain, the radiology office will call you to arrange you appointment   Follow up in 6   months Cuidese de la diabetes   Use el CPAP, consulte a pulmonologia  Tome donepezil  5 mg al dia una vez que la doctora la deje .  Cuide sus globulos rojos y blancos    For psychiatric meds, mood meds: Please have your primary care physician manage these medications.  If you have any severe symptoms of a stroke, or other severe issues such as confusion,severe chills or fever, etc call 911 or go to the ER as you may need to be evaluated further     For assessment of decision of mental capacity and competency:  Call Dr. Rosaline Nine, geriatric psychiatrist at [phone]  Counseling regarding caregiver distress, including caregiver depression, anxiety and issues regarding community resources, adult day care programs, adult living facilities, or memory care questions:  please contact your  Primary Doctor's Social Worker   Whom to call: Memory  decline, memory medications: Call our office [phone]    [email or web address]   RECOMMENDATIONS FOR ALL PATIENTS WITH MEMORY PROBLEMS: 1. Continue to exercise (Recommend 30 minutes of walking everyday, or 3 hours every week) 2. Increase social interactions - continue going to [place] and enjoy social gatherings with friends and family 3. Eat healthy, avoid fried foods and eat more fruits and vegetables 4. Maintain adequate blood pressure, blood sugar, and blood cholesterol level. Reducing the risk of stroke and cardiovascular disease also helps promoting better memory. 5. Avoid stressful situations. Live a simple life and avoid aggravations. Organize your time and prepare for the next day in anticipation. 6. Sleep well, avoid any interruptions of sleep and avoid any distractions in the bedroom that may interfere with  adequate sleep quality 7. Avoid sugar, avoid sweets as there is a strong link between excessive sugar intake, diabetes, and cognitive impairment We discussed the Mediterranean diet, which has been shown to help patients reduce the risk of progressive memory disorders and reduces cardiovascular risk. This includes eating fish, eat fruits and green leafy vegetables, nuts like almonds and hazelnuts, walnuts, and also use olive oil. Avoid fast foods and fried foods as much as possible. Avoid sweets and sugar as sugar use has been linked to worsening of memory function.  There is always a concern of gradual progression of memory problems. If this is the case, then we may need to adjust level of care according to patient needs. Support, both to the patient and caregiver, should then be put into place.         FALL PRECAUTIONS: Be cautious when walking. Scan the area for obstacles that may increase the risk of trips and falls. When getting up in the mornings, sit up at the edge of the bed for a few minutes before getting out of bed. Consider elevating the bed at the head end to avoid drop of blood pressure when getting up. Walk always in a well-lit room (use night lights in the walls). Avoid area rugs or power cords from appliances in the middle of the walkways. Use a walker or a cane if necessary and consider physical therapy for balance exercise. Get your eyesight checked regularly.  FINANCIAL OVERSIGHT: Supervision, especially oversight when making financial decisions or transactions is also recommended.  HOME SAFETY: Consider the safety of the kitchen when operating appliances like stoves, microwave oven,  and blender. Consider having supervision and share cooking responsibilities until no longer able to participate in those. Accidents with firearms and other hazards in the house should be identified and addressed as well.   ABILITY TO BE LEFT ALONE: If patient is unable to contact 911 operator,  consider using LifeLine, or when the need is there, arrange for someone to stay with patients. Smoking is a fire hazard, consider supervision or cessation. Risk of wandering should be assessed by caregiver and if detected at any point, supervision and safe proof recommendations should be instituted.  MEDICATION SUPERVISION: Inability to self-administer medication needs to be constantly addressed. Implement a mechanism to ensure safe administration of the medications.      Mediterranean Diet A Mediterranean diet refers to food and lifestyle choices that are based on the traditions of countries located on the [place]. This way of eating has been shown to help prevent certain conditions and improve outcomes for people who have chronic diseases, like kidney disease and heart disease. What are tips for following this plan? Lifestyle  Cook and eat meals together with your family, when possible. Drink enough fluid to keep your urine clear or pale yellow. Be physically active every day. This includes: Aerobic exercise like running or swimming. Leisure activities like gardening, walking, or housework. Get 7-8 hours of sleep each night. If recommended by your health care provider, drink red wine in moderation. This means 1 glass a day for nonpregnant women and 2 glasses a day for men. A glass of wine equals 5 oz (150 mL). Reading food labels  Check the serving size of packaged foods. For foods such as rice and pasta, the serving size refers to the amount of cooked product, not dry. Check the total fat in packaged foods. Avoid foods that have saturated fat or trans fats. Check the ingredients list for added sugars, such as corn syrup. Shopping  At the grocery store, buy most of your food from the areas near the walls of the store. This includes: Fresh fruits and vegetables (produce). Grains, beans, nuts, and seeds. Some of these may be available in unpackaged forms or large amounts (in  bulk). Fresh seafood. Poultry and eggs. Low-fat dairy products. Buy whole ingredients instead of prepackaged foods. Buy fresh fruits and vegetables in-season from local farmers markets. Buy frozen fruits and vegetables in resealable bags. If you do not have access to quality fresh seafood, buy precooked frozen shrimp or canned fish, such as tuna, salmon, or sardines. Buy small amounts of raw or cooked vegetables, salads, or olives from the deli or salad bar at your store. Stock your pantry so you always have certain foods on hand, such as olive oil, canned tuna, canned tomatoes, rice, pasta, and beans. Cooking  Cook foods with extra-virgin olive oil instead of using butter or other vegetable oils. Have meat as a side dish, and have vegetables or grains as your main dish. This means having meat in small portions or adding small amounts of meat to foods like pasta or stew. Use beans or vegetables instead of meat in common dishes like chili or lasagna. Experiment with different cooking methods. Try roasting or broiling vegetables instead of steaming or sauteing them. Add frozen vegetables to soups, stews, pasta, or rice. Add nuts or seeds for added healthy fat at each meal. You can add these to yogurt, salads, or vegetable dishes. Marinate fish or vegetables using olive oil, lemon juice, garlic, and fresh herbs. Meal planning  Plan to eat  1 vegetarian meal one day each week. Try to work up to 2 vegetarian meals, if possible. Eat seafood 2 or more times a week. Have healthy snacks readily available, such as: Vegetable sticks with hummus. Greek yogurt. Fruit and nut trail mix. Eat balanced meals throughout the week. This includes: Fruit: 2-3 servings a day Vegetables: 4-5 servings a day Low-fat dairy: 2 servings a day Fish, poultry, or lean meat: 1 serving a day Beans and legumes: 2 or more servings a week Nuts and seeds: 1-2 servings a day Whole grains: 6-8 servings a day Extra-virgin  olive oil: 3-4 servings a day Limit red meat and sweets to only a few servings a month What are my food choices? Mediterranean diet Recommended Grains: Whole-grain pasta. Brown rice. Bulgar wheat. Polenta. Couscous. Whole-wheat bread. [place] Made[place]. Vegetables: Artichokes. Beets. Broccoli. Cabbage. Carrots. Eggplant. Green beans. Chard. Kale. Spinach. Onions. Leeks. Peas. Squash. Tomatoes. Peppers. Radishes. Fruits: Apples. Apricots. Avocado. Berries. Bananas. Cherries. Dates. Figs. Grapes. Lemons. Melon. Oranges. Peaches. Plums. Pomegranate. Meats and other protein foods: Beans. Almonds. Sunflower seeds. Pine nuts. Peanuts. Cod. Salmon. Scallops. Shrimp. Tuna. Tilapia. Clams. Oysters. Eggs. Dairy: Low-fat milk. Cheese. Greek yogurt. Beverages: Water. Red wine. Herbal tea. Fats and oils: Extra virgin olive oil. Avocado oil. Grape seed oil. Sweets and desserts: Greek yogurt with honey. Baked apples. Poached pears. Trail mix. Seasoning and other foods: Basil. Cilantro. Coriander. Cumin. Mint. Parsley. Sage. Rosemary. Tarragon. Garlic. Oregano. Thyme. Pepper. Balsalmic vinegar. Tahini. Hummus. Tomato sauce. Olives. Mushrooms. Limit these Grains: Prepackaged pasta or rice dishes. Prepackaged cereal with added sugar. Vegetables: Deep fried potatoes (french fries). Fruits: Fruit canned in syrup. Meats and other protein foods: Beef. Pork. Lamb. Poultry with skin. Hot dogs. [place]. Dairy: Ice cream. Sour cream. Whole milk. Beverages: Juice. Sugar-sweetened soft drinks. Beer. Liquor and spirits. Fats and oils: Butter. Canola oil. Vegetable oil. Beef fat (tallow). Lard. Sweets and desserts: Cookies. Cakes. Pies. Candy. Seasoning and other foods: Mayonnaise. Premade sauces and marinades. The items listed may not be a complete list. Talk with your dietitian about what dietary choices are right for you. Summary The Mediterranean diet includes both food and lifestyle choices. Eat a variety of fresh  fruits and vegetables, beans, nuts, seeds, and whole grains. Limit the amount of red meat and sweets that you eat. Talk with your health care provider about whether it is safe for you to drink red wine in moderation. This means 1 glass a day for nonpregnant women and 2 glasses a day for men. A glass of wine equals 5 oz (150 mL). This information is not intended to replace advice given to you by your health care provider. Make sure you discuss any questions you have with your health care provider. Document Released: 01/22/2016 Document Revised: 02/24/2016 Document Reviewed: 01/22/2016 Elsevier Interactive Patient Education  2017 [employer].

## 2024-05-14 ENCOUNTER — Other Ambulatory Visit: Payer: Self-pay

## 2024-05-25 ENCOUNTER — Other Ambulatory Visit: Payer: Self-pay

## 2024-05-28 ENCOUNTER — Other Ambulatory Visit: Payer: Self-pay

## 2024-05-30 ENCOUNTER — Other Ambulatory Visit: Payer: Self-pay

## 2024-05-31 ENCOUNTER — Other Ambulatory Visit: Payer: Self-pay

## 2024-06-03 ENCOUNTER — Telehealth: Payer: Self-pay | Admitting: Internal Medicine

## 2024-06-03 NOTE — Telephone Encounter (Signed)
 Clarisa: please see message from [place] and [place].  Pt is on Rybelsus  3 mg for DM and to help with weight loss. Med provided through Pt Assistance Program. Please find out if she is tolerating the 3 mg ok and would like to increase to the 7 mg to assist with further weight lost. If no side effects of severe diarrhea/constipation, Nausea, vomiting or abdominal pain, or low BS, we can increase to the 7 mg. The assistance program will only pay for refill at the 7 mg dose. So if tolerating ok and ready for increase, please let us  know so Herlene or myself can send rxn for the 7 mg tablet.

## 2024-06-03 NOTE — Telephone Encounter (Signed)
-----   Message from Garnette LITTIE Salinas Ausdall sent at 05/28/2024 12:08 PM EST ----- Looping Dr. Vicci in just to make sure.   Dr. Vicci,   Please see Kelly's message. We are needing to adjust patient's Rybelsus  dose to 7 mg daily for insurance. I reviewed notes and it doesn't look like she has a contraindication. If appropriate are you able to send? ----- Message ----- From: Chrys Burnard MATSU, CPhT Sent: 05/28/2024  11:41 AM EST To: Garnette LITTIE Salinas Tonia, RPH-CPP  Patient is enrolled in PAP through 06/13/2024 and Rybelsus  will be removed from the program 06/2024. She is requesting a refill of the 3mg , but PAP only allows for 1 fill of 3mg  and it must be titrated for 7mg  PAP refills per program restrictions. She received her 1 month free of the 3mg  and now we are trying to bill medicare for her refill for a copay but medicare will not pay because they see she has an active PAP enrollment.   Can she be increased to the 7mg ? If not, she may need to change therpy due to cost because Rybelsus  would be cash price for the month of December.

## 2024-06-04 ENCOUNTER — Other Ambulatory Visit: Payer: Self-pay

## 2024-06-04 MED ORDER — RYBELSUS 7 MG PO TABS
7.0000 mg | ORAL_TABLET | Freq: Every day | ORAL | 3 refills | Status: DC
  Filled 2024-06-04: qty 30, 30d supply, fill #0
  Filled 2024-07-01: qty 30, 30d supply, fill #1

## 2024-06-04 NOTE — Telephone Encounter (Signed)
 Noted. Patient was informed during previous phone call to pick-up medication when available. No further assistance needed at this time.

## 2024-06-04 NOTE — Telephone Encounter (Signed)
 Called & spoke to the patient. Verified name & DOB. Informed that the medication is approved. Patient expressed verbal understanding.

## 2024-06-04 NOTE — Telephone Encounter (Signed)
 Called & spoke to the patient. Verified name & DOB. Informed that the Medication Patient Assistance Program will only pay for refill at the 7 mg dose and no longer for the 3 mg that she is currently on. Informed that she is on Rybelsus  3 mg for DM and to help with weight loss. Confirmed with patient that she is tolerating the 3 mg ok and would like to increase to the 7 mg to assist with further weight lost. Confirmed with patient that she is not experiencing any side effects of severe diarrhea/constipation, nausea, vomiting or abdominal pain. Patient confirmed that she would like to increase the dosage to 7 mg. Luke please send in a prescription.

## 2024-06-04 NOTE — Telephone Encounter (Signed)
 Thank you, friend!!   Burnard,   I got approve from patient and the MD to increase to 7mg . Rxn sent.

## 2024-06-05 ENCOUNTER — Other Ambulatory Visit: Payer: Self-pay

## 2024-06-06 ENCOUNTER — Other Ambulatory Visit: Payer: Self-pay

## 2024-06-18 ENCOUNTER — Other Ambulatory Visit: Payer: Self-pay

## 2024-06-25 ENCOUNTER — Other Ambulatory Visit: Payer: Self-pay

## 2024-07-02 ENCOUNTER — Other Ambulatory Visit: Payer: Self-pay

## 2024-07-02 ENCOUNTER — Encounter: Payer: Self-pay | Admitting: Internal Medicine

## 2024-07-02 ENCOUNTER — Ambulatory Visit: Admitting: Internal Medicine

## 2024-07-02 DIAGNOSIS — J988 Other specified respiratory disorders: Secondary | ICD-10-CM | POA: Diagnosis not present

## 2024-07-02 DIAGNOSIS — E1159 Type 2 diabetes mellitus with other circulatory complications: Secondary | ICD-10-CM | POA: Diagnosis not present

## 2024-07-02 DIAGNOSIS — E161 Other hypoglycemia: Secondary | ICD-10-CM | POA: Diagnosis not present

## 2024-07-02 DIAGNOSIS — Z7984 Long term (current) use of oral hypoglycemic drugs: Secondary | ICD-10-CM

## 2024-07-02 DIAGNOSIS — I10 Essential (primary) hypertension: Secondary | ICD-10-CM | POA: Diagnosis not present

## 2024-07-02 DIAGNOSIS — B9789 Other viral agents as the cause of diseases classified elsewhere: Secondary | ICD-10-CM | POA: Diagnosis not present

## 2024-07-02 DIAGNOSIS — I152 Hypertension secondary to endocrine disorders: Secondary | ICD-10-CM

## 2024-07-02 DIAGNOSIS — N1832 Chronic kidney disease, stage 3b: Secondary | ICD-10-CM | POA: Diagnosis not present

## 2024-07-02 DIAGNOSIS — Z6837 Body mass index (BMI) 37.0-37.9, adult: Secondary | ICD-10-CM

## 2024-07-02 DIAGNOSIS — Z794 Long term (current) use of insulin: Secondary | ICD-10-CM

## 2024-07-02 DIAGNOSIS — E119 Type 2 diabetes mellitus without complications: Secondary | ICD-10-CM

## 2024-07-02 DIAGNOSIS — E785 Hyperlipidemia, unspecified: Secondary | ICD-10-CM

## 2024-07-02 DIAGNOSIS — E1169 Type 2 diabetes mellitus with other specified complication: Secondary | ICD-10-CM

## 2024-07-02 DIAGNOSIS — R0981 Nasal congestion: Secondary | ICD-10-CM

## 2024-07-02 LAB — GLUCOSE, POCT (MANUAL RESULT ENTRY): POC Glucose: 189 mg/dL — AB (ref 70–99)

## 2024-07-02 LAB — POCT GLYCOSYLATED HEMOGLOBIN (HGB A1C): HbA1c, POC (controlled diabetic range): 8 % — AB (ref 0.0–7.0)

## 2024-07-02 MED ORDER — HUMULIN 70/30 KWIKPEN (70-30) 100 UNIT/ML ~~LOC~~ SUPN
PEN_INJECTOR | SUBCUTANEOUS | 11 refills | Status: AC
  Filled 2024-07-02: qty 15, fill #0

## 2024-07-02 MED ORDER — AMLODIPINE BESYLATE 5 MG PO TABS
5.0000 mg | ORAL_TABLET | Freq: Every day | ORAL | 1 refills | Status: AC
  Filled 2024-07-02: qty 90, 90d supply, fill #0

## 2024-07-02 MED ORDER — LOSARTAN POTASSIUM 50 MG PO TABS
50.0000 mg | ORAL_TABLET | Freq: Every day | ORAL | 1 refills | Status: AC
  Filled 2024-07-02: qty 90, 90d supply, fill #0

## 2024-07-02 MED ORDER — BENZONATATE 100 MG PO CAPS
100.0000 mg | ORAL_CAPSULE | Freq: Two times a day (BID) | ORAL | 0 refills | Status: AC | PRN
  Filled 2024-07-02 (×2): qty 20, 10d supply, fill #0

## 2024-07-02 MED ORDER — RYBELSUS 14 MG PO TABS
14.0000 mg | ORAL_TABLET | Freq: Every day | ORAL | 5 refills | Status: DC
  Filled 2024-07-02: qty 30, 30d supply, fill #0

## 2024-07-02 NOTE — Patient Instructions (Signed)
" °  VISIT SUMMARY: Today, we discussed the management of your diabetes, hypertension, high cholesterol, chronic kidney disease, and ongoing cold symptoms. We made some adjustments to your medications and provided recommendations to help manage your conditions more effectively.  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 We have decreased your evening insulin  dose to 8 units and advised you to eat fruit before bedtime to help prevent low blood sugar. We also increased your Rybelsus  dose to 14 mg after your current supply runs out. Continue taking Farxiga  5 mg daily.  -HYPERTENSION ASSOCIATED WITH TYPE 2 DIABETES MELLITUS: Your blood pressure is slightly elevated. Continue taking losartan  50 mg daily and amlodipine  5 mg daily, but make sure to take 1.5 tablets of amlodipine  as prescribed. Monitor your blood pressure at home weekly, aiming for a goal of 130/80 mmHg or lower.  -HYPERLIPIDEMIA ASSOCIATED WITH TYPE 2 DIABETES MELLITUS: Your cholesterol levels are being managed with atorvastatin . Continue taking atorvastatin  20 mg daily.  -CHRONIC KIDNEY DISEASE STAGE 3B: You have chronic kidney disease and are awaiting blood work results. Once the results are available, schedule an appointment with your kidney specialist.  -VIRAL UPPER RESPIRATORY INFECTION WITH NASAL CONGESTION: You have been experiencing cold symptoms for a month. We prescribed cough tablets for relief and advised using hydrocortisone cream 1% for nasal redness and crusting. Continue using your nasal spray as prescribed.  INSTRUCTIONS: Please follow up with your nephrologist once your blood work results are available. Monitor your blood pressure at home weekly and aim for a goal of 130/80 mmHg or lower. If you have any concerns or experience any new symptoms, please contact our  office.    Contains text generated by Abridge.    .    Contains text generated by Abridge.   "

## 2024-07-02 NOTE — Progress Notes (Signed)
 "   Patient ID: Chelsea Coleman, female    DOB: Oct 23, 1955  MRN: [id number]  CC: Diabetes (DM f/u. Med refills/Concern of low BS readings at night /Already received flu vax)   Subjective: Chelsea Coleman is a 69 y.o. female who presents for chronic ds management. Her chronic medical issues include:  Pt with hx of DM neuropathy and retinopathy, HTN, HL, obesity, anemia of chronic ds, thrombocytosis/leukocytosis (followed by hematology), CKD 3b, ACD, Vit D, osteoporosis (Dr. 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    AMN Language interpreter used during this encounter. #[id number], [facility]   Discussed the use of AI scribe software for clinical note transcription with the patient, who gave verbal consent to proceed.  History of Present Illness Chelsea Coleman is a 69 year old female with diabetes, hypertension, and chronic kidney disease who presents for follow-up of her chronic medical conditions.  DM/Obesity: Results for orders placed or performed in visit on 07/02/24  POCT glucose (manual entry)   Collection Time: 07/02/24 11:19 AM  Result Value Ref Range   POC Glucose 189 (A) 70 - 99 mg/dl  POCT glycosylated hemoglobin (Hb A1C)   Collection Time: 07/02/24 11:33 AM  Result Value Ref Range   Hemoglobin A1C     HbA1c POC (<> result, manual entry)     HbA1c, POC (prediabetic range)     HbA1c, POC (controlled diabetic range) 8.0 (A) 0.0 - 7.0 %   Her diabetes management includes Rybelsus  7 mg, Farxiga  5 mg, and Humulin  70/30 insulin  with 20 units in the morning and 10 units with dinner. Her A1c has increased from 7.8 to 8. She uses a continuous glucose monitor Freestyle Libre which I was able to review today. She experiences hypoglycemia at night, waking up feeling dizzy, and eats a banana to manage it. This occurs  about four times a week. Can drop as low as 50. She does not eat snacks between meals and administers her evening insulin  dose right after she eats dinner. No significant weight loss since being on Rybelsus . She feels Rybelsus  has decreased her appetite. She has been on the 7 mg dose for about a month without side effects like vomiting or diarrhea. CGM DATA: 7 day  TIR: 71% High: 25% Very High: Low:4%  14 day TIR: 76% High: 21% Very High: Low: 3%  Low BS noted b/w 12 a.m to 6 a.m   HTN: She should be on losartan  50 mg daily and amlodipine  7.5 mg. She only takes one tablet instead of 1.5 of Amlodipine  due to difficulty splitting the tablet. She checks her blood pressure occasionally at home, with the last reading being 100 mmHg systolic on Sunday.  She has had a cold with nasal congestion for almost a month, accompanied by a cough producing white phlegm and occasional shortness of breath, which she attributes to nasal congestion. She has a deviated septum and is scheduled for surgery next month. No fever or pain over sinuses. We had done her pre-op eval 04/2024.  CKD 3b: GFRs have been 30s-42 with last one in Nov 2025 of 33. She has not seen her kidney specialist recently. She is awaiting blood work results before her next appointment. Her last visit to the nephrologist was last year.  HL: She takes atorvastatin  20 mg daily for cholesterol, with the last cholesterol check in September of the previous year.  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 12/28/2021   Mixed stress and urge urinary incontinence 12/28/2021   Class 2 severe obesity with serious comorbidity and body mass index (BMI) of 35.0 to 35.9 in adult 12/28/2021   Secondary hyperparathyroidism 12/02/2021   Age related  osteoporosis 11/30/2021   Osteoporosis of multiple sites 04/21/2021   Leukocytosis 11/18/2020   Thrombocytosis 11/18/2020   Hypertension associated with stage 3 chronic kidney disease due to type 2 diabetes mellitus (HCC) 11/03/2020   Obesity (BMI 35.0-39.9 without comorbidity) 11/03/2020   Hyperlipidemia associated with type 2 diabetes mellitus (HCC) 04/06/2019   Gastroesophageal reflux disease without esophagitis 04/06/2019   Type 2 diabetes, controlled, with peripheral neuropathy (HCC) 01/26/2019   Diabetic retinopathy associated with controlled type 2 diabetes mellitus (HCC) 01/26/2019   Essential hypertension 01/26/2019   Anemia 01/26/2019   Drug-induced constipation 01/26/2019   Memory impairment 01/26/2019   Positive depression screening 01/26/2019     Medications Ordered Prior to Encounter[1]  Allergies[2]  Social History   Socioeconomic History   Marital status: Married    Spouse name: Not on file   Number of children: 1   Years of education: 12 grade   Highest education level: Not on file  Occupational History   Occupation: unemployed  Tobacco Use   Smoking status: Never   Smokeless tobacco: Never  Vaping Use   Vaping status: Never Used  Substance and Sexual Activity   Alcohol use: Never   Drug use: Never   Sexual activity: Yes    Birth control/protection: Post-menopausal  Other Topics Concern   Not on file  Social History Narrative   Right handed   Lives with husband   spanish   Social Drivers of Health   Tobacco Use: Low Risk (07/02/2024)   Patient History    Smoking Tobacco Use: Never    Smokeless Tobacco Use: Never    Passive Exposure: Not on file  Financial Resource Strain: Medium Risk (12/19/2023)   Overall Financial Resource Strain (CARDIA)    Difficulty of Paying Living Expenses: Somewhat hard  Food Insecurity: No Food Insecurity (12/19/2023)   Epic    Worried About Radiation Protection Practitioner of Food in the Last Year: Never true    [employer] of Food in the  Last Year: Never true  Transportation Needs: Unmet Transportation Needs (12/19/2023)   Epic    Lack of Transportation (Medical): Yes    Lack of Transportation (Non-Medical): No  Physical Activity: Inactive (12/19/2023)   Exercise Vital Sign    Days of Exercise per Week: 0 days    Minutes of Exercise per Session: 0 min  Stress: No Stress Concern Present (12/19/2023)   [employer] of Occupational Health - Occupational Stress Questionnaire    Feeling of Stress: Not at all  Social Connections: Moderately Isolated (12/19/2023)   Social Connection and Isolation Panel    Frequency of Communication with Friends and Family: Three times a week    Frequency of Social Gatherings with Friends and Family: Once a week    Attends Religious Services: Never    Database Administrator or Organizations: No    Attends Banker Meetings: Never    Marital Status: Married  Catering Manager Violence: Not At Risk (12/19/2023)   Epic    Fear of Current or  Ex-Partner: No    Emotionally Abused: No    Physically Abused: No    Sexually Abused: No  Depression (PHQ2-9): Low Risk (12/19/2023)   Depression (PHQ2-9)    PHQ-2 Score: 3  Alcohol Screen: Low Risk (12/19/2023)   Alcohol Screen    Last Alcohol Screening Score (AUDIT): 0  Housing: Low Risk (12/19/2023)   Epic    Unable to Pay for Housing in the Last Year: No    Number of Times Moved in the Last Year: 0    Homeless in the Last Year: No  Utilities: Not At Risk (12/19/2023)   Epic    Threatened with loss of utilities: No  Health Literacy: Adequate Health Literacy (12/19/2023)   B1300 Health Literacy    Frequency of need for help with medical instructions: Never    Family History  Problem Relation Age of Onset   Diabetes Mother    Colon cancer Neg Hx    Colon polyps Neg Hx    Esophageal cancer Neg Hx    Rectal cancer Neg Hx    Stomach cancer Neg Hx     Past Surgical History:  Procedure Laterality Date   ABDOMINAL HYSTERECTOMY     fibroid    CHOLECYSTECTOMY     INCONTINENCE SURGERY     REFRACTIVE SURGERY  2017   BL    ROS: Review of Systems Negative except as stated above  PHYSICAL EXAM: BP 131/77   Pulse 86   Temp (!) 97.5 F (36.4 C) (Oral)   Ht 5' 1 (1.549 m)   Wt 198 lb (89.8 kg)   SpO2 98%   BMI 37.41 kg/m   Wt Readings from Last 3 Encounters:  07/02/24 198 lb (89.8 kg)  05/07/24 200 lb (90.7 kg)  04/16/24 196 lb 3.2 oz (89 kg)    Physical Exam  General appearance - alert, well appearing, older obese Hispanic female and in no distress. Sounds nasally congested Mental status - normal mood, behavior, speech, dress, motor activity, and thought processes Mouth - mucous membranes moist, pharynx normal without lesions Nose: enlargement of both turbinates RT>>LT Neck - supple, no significant adenopathy Chest - clear to auscultation, no wheezes, rales or rhonchi, symmetric air entry Heart - normal rate, regular rhythm, normal S1, S2, no murmurs, rubs, clicks or gallops Extremities - peripheral pulses normal, no pedal edema, no clubbing or cyanosis MSK - has cane     Latest Ref Rng & Units 04/23/2024   10:02 AM 03/12/2024    9:43 AM 02/27/2024   12:05 PM  CMP  Glucose 70 - 99 mg/dL 840  845  857   BUN 8 - 27 mg/dL 21  17  16    Creatinine 0.57 - 1.00 mg/dL 8.34  8.62  8.58   Sodium 134 - 144 mmol/L 134  137  132   Potassium 3.5 - 5.2 mmol/L 4.6  5.3  5.0   Chloride 96 - 106 mmol/L 96  99  94   CO2 20 - 29 mmol/L 21  23  22    Calcium  8.7 - 10.3 mg/dL 8.8  9.3  9.4   Total Protein 6.0 - 8.5 g/dL   7.7   Total Bilirubin 0.0 - 1.2 mg/dL   0.2   Alkaline Phos 49 - 135 IU/L   97   AST 0 - 40 IU/L   20   ALT 0 - 32 IU/L   12    Lipid Panel     Component Value Date/Time  CHOL 185 02/27/2024 1205   TRIG 143 02/27/2024 1205   HDL 58 02/27/2024 1205   CHOLHDL 3.2 02/27/2024 1205   LDLCALC 102 (H) 02/27/2024 1205    CBC    Component Value Date/Time   WBC 11.4 (H) 02/27/2024 1205   WBC 10.3  04/19/2022 0824   RBC 4.01 02/27/2024 1205   RBC 3.70 (L) 04/19/2022 0824   HGB 12.1 02/27/2024 1205   HCT 38.2 02/27/2024 1205   PLT 461 (H) 02/27/2024 1205   MCV 95 02/27/2024 1205   MCH 30.2 02/27/2024 1205   MCH 29.7 04/19/2022 0824   MCHC 31.7 02/27/2024 1205   MCHC 31.6 04/19/2022 0824   RDW 12.9 02/27/2024 1205   LYMPHSABS 1.9 02/16/2023 0953   MONOABS 0.7 04/19/2022 0824   EOSABS 0.2 02/16/2023 0953   BASOSABS 0.1 02/16/2023 0953    ASSESSMENT AND PLAN: 1. Type 2 diabetes mellitus associated with morbid obesity (HCC) (Primary) Pt has obesity associated with HTN and HL Not at goal.  She has some hypoglycemic episodes at night.  Recommend change Humulin  insulin  7030 to 20 units in the morning and 8 units in the evening.  Continue Farxiga  5 mg daily. We discussed increasing the Rybelsus  from 7 mg daily to 14 mg daily hoping that she would obtain greater weight loss with the increased dose.  She is agreeable to the increase. - POCT glycosylated hemoglobin (Hb A1C) - POCT glucose (manual entry) - Semaglutide  (RYBELSUS ) 14 MG TABS; Take 1 tablet (14 mg total) by mouth daily.  Dispense: 30 tablet; Refill: 5 - insulin  isophane & regular human KwikPen (HUMULIN  70/30 KWIKPEN) (70-30) 100 UNIT/ML KwikPen; Inject 20-23 units subcutaneously with breakfast and 8 units with dinner.  Dispense: 15 mL; Refill: 11  2. Nocturnal hypoglycemia See #1 above  3. Insulin  long-term use (HCC) See #1 above.  4. Diabetes mellitus treated with oral medication (HCC) Hold Rybelsus  1 wk prior to her nasal surgery.  5. Hypertension associated with diabetes (HCC) Close to goal.  Continue Cozaar  50 mg daily and amlodipine  5 mg daily. - amLODipine  (NORVASC ) 5 MG tablet; Take 1 tablet (5 mg total) by mouth daily.  Dispense: 90 tablet; Refill: 1 - losartan  (COZAAR ) 50 MG tablet; Take 1 tablet (50 mg total) by mouth daily.  Dispense: 90 tablet; Refill: 1  6. Hyperlipidemia associated with type 2 diabetes  mellitus (HCC) Continue atorvastatin .  7. CKD stage 3b, GFR 30-44 ml/min (HCC) Stable.  Avoid NSAIDs.  Followed by nephrology.  8. Viral respiratory illness No need for antibiotics at this time. - benzonatate  (TESSALON ) 100 MG capsule; Take 1 capsule (100 mg total) by mouth 2 (two) times daily as needed for cough.  Dispense: 20 capsule; Refill: 0  9. Nasal congestion Likely worsened by recent viral respiratory illness.  She will be having septoplasty and inferior turbinate reduction by her ENT specialist next month.  Advised to hold Rybelsus  for 1 full week prior to her surgery.   Patient was given the opportunity to ask questions.  Patient verbalized understanding of the plan and was able to repeat key elements of the plan.   This documentation was completed using Paediatric nurse.  Any transcriptional errors are unintentional.  Orders Placed This Encounter  Procedures   POCT glycosylated hemoglobin (Hb A1C)   POCT glucose (manual entry)     Requested Prescriptions   Signed Prescriptions Disp Refills   amLODipine  (NORVASC ) 5 MG tablet 90 tablet 1    Sig: Take 1 tablet (  5 mg total) by mouth daily.   losartan  (COZAAR ) 50 MG tablet 90 tablet 1    Sig: Take 1 tablet (50 mg total) by mouth daily.   Semaglutide  (RYBELSUS ) 14 MG TABS 30 tablet 5    Sig: Take 1 tablet (14 mg total) by mouth daily.   insulin  isophane & regular human KwikPen (HUMULIN  70/30 KWIKPEN) (70-30) 100 UNIT/ML KwikPen 15 mL 11    Sig: Inject 20-23 units subcutaneously with breakfast and 8 units with dinner.   benzonatate  (TESSALON ) 100 MG capsule 20 capsule 0    Sig: Take 1 capsule (100 mg total) by mouth 2 (two) times daily as needed for cough.    Return in about 4 months (around 10/30/2024).  Barnie Louder, MD, FACP     [1]  Current Outpatient Medications on File Prior to Visit  Medication Sig Dispense Refill   Accu-Chek FastClix Lancets MISC Use to check blood sugar 3 TIMES  DAILY. 100 each 2   atorvastatin  (LIPITOR) 20 MG tablet Take 1 tablet (20 mg total) by mouth daily. 90 tablet 3   azelastine  (ASTELIN ) 0.1 % nasal spray Place 2 sprays into both nostrils 2 (two) times daily. Use in each nostril as directed 30 mL 12   Blood Glucose Monitoring Suppl (ACCU-CHEK GUIDE ME) w/Device KIT Use to check blood sugar 3 TIMES DAILY. 1 kit 0   Continuous Glucose Receiver (FREESTYLE LIBRE 3 READER) DEVI Use as directed. 1 each 0   Continuous Glucose Sensor (FREESTYLE LIBRE 3 PLUS SENSOR) MISC USE TO MONITOR BLOOD GLUCOSE CONTINOUSLY. CHANGE SENSOR EVERY 15 DAYS. 2 each 6   dapagliflozin  propanediol (FARXIGA ) 5 MG TABS tablet Take 1 tablet (5 mg total) by mouth daily. 30 tablet 11   ergocalciferol  (VITAMIN D2) 1.25 MG (50000 UT) capsule Take 1 capsule (50,000 Units total) by mouth once a week. 4 capsule 1   gabapentin  (NEURONTIN ) 400 MG capsule TAKE 1 CAPSULE BY MOUTH TWICE  DAILY 200 capsule 2   glucose blood (ACCU-CHEK GUIDE) test strip Use to check blood sugar 3 TIMES DAILY. 100 each 2   Insulin  Pen Needle (TRUEPLUS PEN NEEDLES) 32G X 4 MM MISC Use to inject insulin  twice daily. 100 each 2   Insulin  Syringe-Needle U-100 (TRUEPLUS INSULIN  SYRINGE) 31G X 5/16 0.3 ML MISC Use to inject insulin  twice daily 100 each 2   omeprazole  (PRILOSEC) 20 MG capsule TAKE 1 CAPSULE (20 MG TOTAL) BY MOUTH DAILY. 30 capsule 3   OVER THE COUNTER MEDICATION Take 1 capsule by mouth at bedtime.     solifenacin  (VESICARE ) 10 MG tablet Take 1 tablet (10 mg total) by mouth daily. 30 tablet 11   aspirin  EC 81 MG tablet Take 1 tablet (81 mg total) by mouth daily. Swallow whole. (Patient not taking: Reported on 07/02/2024) 100 tablet 1   topiramate  (TOPAMAX ) 25 MG tablet Take 1 tablet (25 mg total) by mouth at bedtime as needed. (Patient not taking: Reported on 07/02/2024) 90 tablet 1   Current Facility-Administered Medications on File Prior to Visit  Medication Dose Route Frequency Provider Last Rate Last  Admin   [START ON 08/26/2024] denosumab  (PROLIA ) injection 60 mg  60 mg Subcutaneous Q6 months Shamleffer, Ibtehal Jaralla, MD      [2] No Known Allergies  "

## 2024-07-03 ENCOUNTER — Other Ambulatory Visit: Payer: Self-pay | Admitting: Pharmacist

## 2024-07-03 ENCOUNTER — Other Ambulatory Visit: Payer: Self-pay

## 2024-07-03 MED ORDER — TRULICITY 0.75 MG/0.5ML ~~LOC~~ SOAJ
SUBCUTANEOUS | 0 refills | Status: AC
  Filled 2024-07-03: qty 2, 28d supply, fill #0

## 2024-07-03 MED ORDER — TRULICITY 1.5 MG/0.5ML ~~LOC~~ SOAJ
1.5000 mg | SUBCUTANEOUS | 1 refills | Status: AC
  Filled 2024-07-03: qty 6, 84d supply, fill #0

## 2024-07-04 ENCOUNTER — Telehealth: Payer: Self-pay | Admitting: Internal Medicine

## 2024-07-04 ENCOUNTER — Other Ambulatory Visit: Payer: Self-pay

## 2024-07-04 NOTE — Telephone Encounter (Signed)
-----   Message from Garnette LITTIE Salinas Ausdall sent at 07/03/2024  9:29 AM EST ----- Hey friends -   Dr. Vicci,   Reviewed patient's chart. No previous intolerances noted to Trulicity . She was on Rybelsus  7mg  and was to increase to 14mg  but this is not covered via patient assistance in 2026.   With that being said, I sent 4 weeks of the 0.75 mg Trulicity  dose to be completed prior to starting 1.5 mg. Even though she seems to be tolerated PO semaglutide , subcutaneous Trulicity  may cause more side effects. Therefore, I wanted to send the lower dose for her to complete before taking the 1.5 dose. ----- Message ----- From: Chrys Burnard MATSU, CPhT Sent: 07/02/2024   1:03 PM EST To: Garnette LITTIE Salinas Tonia, RPH-CPP; Barnie NOVAK Jo#  Rybelsus  has been discontinued from the [employer] Patient Assistance Program and is no longer offered free to the patient. The patient is requesting this be changed to Trulicity  as Trulicity  is the only drug in this class with a patient assistance program and she would prefer a free option.

## 2024-07-08 ENCOUNTER — Other Ambulatory Visit: Payer: Self-pay | Admitting: Internal Medicine

## 2024-07-08 DIAGNOSIS — E1142 Type 2 diabetes mellitus with diabetic polyneuropathy: Secondary | ICD-10-CM

## 2024-07-10 ENCOUNTER — Encounter (HOSPITAL_BASED_OUTPATIENT_CLINIC_OR_DEPARTMENT_OTHER): Payer: Self-pay

## 2024-07-10 ENCOUNTER — Other Ambulatory Visit: Payer: Self-pay

## 2024-07-11 ENCOUNTER — Other Ambulatory Visit: Payer: Self-pay

## 2024-07-12 ENCOUNTER — Encounter (HOSPITAL_BASED_OUTPATIENT_CLINIC_OR_DEPARTMENT_OTHER)
Admission: RE | Admit: 2024-07-12 | Discharge: 2024-07-12 | Disposition: A | Discharge status: Home / Self Care | Source: Ambulatory Visit

## 2024-07-12 DIAGNOSIS — Z01818 Encounter for other preprocedural examination: Secondary | ICD-10-CM | POA: Diagnosis present

## 2024-07-12 LAB — BASIC METABOLIC PANEL WITH GFR
Anion gap: 9 (ref 5–15)
BUN: 26 mg/dL — ABNORMAL HIGH (ref 8–23)
CO2: 28 mmol/L (ref 22–32)
Calcium: 9 mg/dL (ref 8.9–10.3)
Chloride: 100 mmol/L (ref 98–111)
Creatinine, Ser: 1.5 mg/dL — ABNORMAL HIGH (ref 0.44–1.00)
GFR, Estimated: 37 mL/min — ABNORMAL LOW
Glucose, Bld: 130 mg/dL — ABNORMAL HIGH (ref 70–99)
Potassium: 5 mmol/L (ref 3.5–5.1)
Sodium: 136 mmol/L (ref 135–145)

## 2024-07-16 NOTE — Telephone Encounter (Signed)
 Prolia  VOB initiated via [email or web address]  Next Prolia  inj DUE: 08/28/24

## 2024-07-17 ENCOUNTER — Other Ambulatory Visit: Payer: Self-pay

## 2024-07-17 ENCOUNTER — Encounter (HOSPITAL_BASED_OUTPATIENT_CLINIC_OR_DEPARTMENT_OTHER): Admitting: Anesthesiology

## 2024-07-17 ENCOUNTER — Ambulatory Visit (HOSPITAL_BASED_OUTPATIENT_CLINIC_OR_DEPARTMENT_OTHER): Admission: RE | Admit: 2024-07-17 | Discharge: 2024-07-17 | Disposition: A | Discharge status: Home / Self Care

## 2024-07-17 ENCOUNTER — Encounter (HOSPITAL_BASED_OUTPATIENT_CLINIC_OR_DEPARTMENT_OTHER): Admission: RE | Disposition: A | Discharge status: Home / Self Care | Payer: Self-pay | Source: Home / Self Care

## 2024-07-17 ENCOUNTER — Encounter (HOSPITAL_BASED_OUTPATIENT_CLINIC_OR_DEPARTMENT_OTHER): Payer: Self-pay

## 2024-07-17 DIAGNOSIS — E669 Obesity, unspecified: Secondary | ICD-10-CM | POA: Insufficient documentation

## 2024-07-17 DIAGNOSIS — J343 Hypertrophy of nasal turbinates: Secondary | ICD-10-CM | POA: Diagnosis not present

## 2024-07-17 DIAGNOSIS — N183 Chronic kidney disease, stage 3 unspecified: Secondary | ICD-10-CM | POA: Insufficient documentation

## 2024-07-17 DIAGNOSIS — Z6837 Body mass index (BMI) 37.0-37.9, adult: Secondary | ICD-10-CM | POA: Insufficient documentation

## 2024-07-17 DIAGNOSIS — J342 Deviated nasal septum: Secondary | ICD-10-CM | POA: Diagnosis not present

## 2024-07-17 DIAGNOSIS — E785 Hyperlipidemia, unspecified: Secondary | ICD-10-CM | POA: Insufficient documentation

## 2024-07-17 DIAGNOSIS — Z7984 Long term (current) use of oral hypoglycemic drugs: Secondary | ICD-10-CM | POA: Insufficient documentation

## 2024-07-17 DIAGNOSIS — E1122 Type 2 diabetes mellitus with diabetic chronic kidney disease: Secondary | ICD-10-CM | POA: Insufficient documentation

## 2024-07-17 DIAGNOSIS — J3489 Other specified disorders of nose and nasal sinuses: Secondary | ICD-10-CM | POA: Diagnosis not present

## 2024-07-17 DIAGNOSIS — Z01818 Encounter for other preprocedural examination: Secondary | ICD-10-CM

## 2024-07-17 DIAGNOSIS — E1142 Type 2 diabetes mellitus with diabetic polyneuropathy: Secondary | ICD-10-CM | POA: Insufficient documentation

## 2024-07-17 DIAGNOSIS — I129 Hypertensive chronic kidney disease with stage 1 through stage 4 chronic kidney disease, or unspecified chronic kidney disease: Secondary | ICD-10-CM | POA: Insufficient documentation

## 2024-07-17 DIAGNOSIS — K219 Gastro-esophageal reflux disease without esophagitis: Secondary | ICD-10-CM | POA: Insufficient documentation

## 2024-07-17 LAB — GLUCOSE, CAPILLARY
Glucose-Capillary: 116 mg/dL — ABNORMAL HIGH (ref 70–99)
Glucose-Capillary: 168 mg/dL — ABNORMAL HIGH (ref 70–99)

## 2024-07-17 MED ORDER — EPINEPHRINE 0.1 % (1MG/ML) IJ FOR NASAL USE
INTRAMUSCULAR | Status: DC | PRN
  Administered 2024-07-17: 10 [drp] via TOPICAL

## 2024-07-17 MED ORDER — DEXAMETHASONE SOD PHOSPHATE PF 10 MG/ML IJ SOLN
INTRAMUSCULAR | Status: DC | PRN
  Administered 2024-07-17: 10 mg via INTRAVENOUS

## 2024-07-17 MED ORDER — LIDOCAINE-EPINEPHRINE 1 %-1:100000 IJ SOLN
INTRAMUSCULAR | Status: DC | PRN
  Administered 2024-07-17: 5 mL

## 2024-07-17 MED ORDER — PROPOFOL 10 MG/ML IV BOLUS
INTRAVENOUS | Status: AC
  Filled 2024-07-17: qty 20

## 2024-07-17 MED ORDER — FENTANYL CITRATE (PF) 100 MCG/2ML IJ SOLN: 25.0000 ug | INTRAMUSCULAR | Status: DC | PRN

## 2024-07-17 MED ORDER — HYDROCODONE-ACETAMINOPHEN 5-325 MG PO TABS
1.0000 | ORAL_TABLET | Freq: Four times a day (QID) | ORAL | 0 refills | Status: AC | PRN
  Filled 2024-07-17: qty 12, 3d supply, fill #0

## 2024-07-17 MED ORDER — ONDANSETRON HCL 4 MG/2ML IJ SOLN: 4.0000 mg | Freq: Once | INTRAMUSCULAR | Status: DC | PRN

## 2024-07-17 MED ORDER — FENTANYL CITRATE (PF) 100 MCG/2ML IJ SOLN
INTRAMUSCULAR | Status: DC | PRN
  Administered 2024-07-17: 100 ug via INTRAVENOUS

## 2024-07-17 MED ORDER — PHENYLEPHRINE 80 MCG/ML (10ML) SYRINGE FOR IV PUSH (FOR BLOOD PRESSURE SUPPORT)
PREFILLED_SYRINGE | INTRAVENOUS | Status: DC | PRN
  Administered 2024-07-17: 120 ug via INTRAVENOUS
  Administered 2024-07-17: 80 ug via INTRAVENOUS
  Administered 2024-07-17 (×2): 160 ug via INTRAVENOUS
  Administered 2024-07-17 (×3): 80 ug via INTRAVENOUS
  Administered 2024-07-17 (×2): 40 ug via INTRAVENOUS
  Administered 2024-07-17 (×3): 80 ug via INTRAVENOUS

## 2024-07-17 MED ORDER — ROCURONIUM BROMIDE 10 MG/ML (PF) SYRINGE
PREFILLED_SYRINGE | INTRAVENOUS | Status: AC
  Filled 2024-07-17: qty 10

## 2024-07-17 MED ORDER — FENTANYL CITRATE (PF) 100 MCG/2ML IJ SOLN
INTRAMUSCULAR | Status: AC
  Filled 2024-07-17: qty 2

## 2024-07-17 MED ORDER — OXYCODONE HCL 5 MG/5ML PO SOLN: 5.0000 mg | Freq: Once | ORAL | Status: DC | PRN

## 2024-07-17 MED ORDER — ONDANSETRON HCL 4 MG/2ML IJ SOLN
INTRAMUSCULAR | Status: DC | PRN
  Administered 2024-07-17: 4 mg via INTRAVENOUS

## 2024-07-17 MED ORDER — FLUORESCEIN SODIUM 1 MG OP STRP
ORAL_STRIP | OPHTHALMIC | Status: AC
  Filled 2024-07-17: qty 1

## 2024-07-17 MED ORDER — ONDANSETRON HCL 4 MG/2ML IJ SOLN
INTRAMUSCULAR | Status: AC
  Filled 2024-07-17: qty 2

## 2024-07-17 MED ORDER — AMOXICILLIN-POT CLAVULANATE 875-125 MG PO TABS
1.0000 | ORAL_TABLET | Freq: Two times a day (BID) | ORAL | 0 refills | Status: AC
  Filled 2024-07-17: qty 14, 7d supply, fill #0

## 2024-07-17 MED ORDER — PROPOFOL 10 MG/ML IV BOLUS
INTRAVENOUS | Status: DC | PRN
  Administered 2024-07-17: 150 mg via INTRAVENOUS

## 2024-07-17 MED ORDER — MUPIROCIN 2 % EX OINT
TOPICAL_OINTMENT | CUTANEOUS | Status: AC
  Filled 2024-07-17: qty 22

## 2024-07-17 MED ORDER — LACTATED RINGERS IV SOLN: INTRAVENOUS | Status: DC

## 2024-07-17 MED ORDER — SUCCINYLCHOLINE CHLORIDE 200 MG/10ML IV SOSY
PREFILLED_SYRINGE | INTRAVENOUS | Status: DC | PRN
  Administered 2024-07-17: 120 mg via INTRAVENOUS

## 2024-07-17 MED ORDER — LIDOCAINE-EPINEPHRINE 1 %-1:100000 IJ SOLN
INTRAMUSCULAR | Status: AC
  Filled 2024-07-17: qty 1

## 2024-07-17 MED ORDER — MUPIROCIN 2 % EX OINT
TOPICAL_OINTMENT | CUTANEOUS | Status: DC | PRN
  Administered 2024-07-17: 1 via TOPICAL

## 2024-07-17 MED ORDER — 0.9 % SODIUM CHLORIDE (POUR BTL) OPTIME
TOPICAL | Status: DC | PRN
  Administered 2024-07-17: 180 mL

## 2024-07-17 MED ORDER — EPHEDRINE SULFATE (PRESSORS) 25 MG/5ML IV SOSY
PREFILLED_SYRINGE | INTRAVENOUS | Status: DC | PRN
  Administered 2024-07-17: 10 mg via INTRAVENOUS

## 2024-07-17 MED ORDER — OXYCODONE HCL 5 MG PO TABS: 5.0000 mg | ORAL_TABLET | Freq: Once | ORAL | Status: DC | PRN

## 2024-07-17 MED ORDER — DEXAMETHASONE SOD PHOSPHATE PF 10 MG/ML IJ SOLN
INTRAMUSCULAR | Status: AC
  Filled 2024-07-17: qty 1

## 2024-07-17 MED ORDER — AMISULPRIDE (ANTIEMETIC) 5 MG/2ML IV SOLN: 10.0000 mg | Freq: Once | INTRAVENOUS | Status: DC | PRN

## 2024-07-17 MED ORDER — FLUORESCEIN SODIUM 1 MG OP STRP
ORAL_STRIP | OPHTHALMIC | Status: DC | PRN
  Administered 2024-07-17: 1

## 2024-07-17 MED ORDER — SODIUM CHLORIDE 0.9 % IV SOLN
INTRAVENOUS | Status: AC | PRN
  Administered 2024-07-17: 25 mL

## 2024-07-17 MED ORDER — LIDOCAINE 2% (20 MG/ML) 5 ML SYRINGE
INTRAMUSCULAR | Status: AC
  Filled 2024-07-17: qty 5

## 2024-07-17 NOTE — Discharge Instructions (Addendum)
 Surgery Discharge Instructions   1. Call your doctor or go to the emergency room if you have: - Fever of 101.5 degrees or higher - Severe pain that has increased greatly since your surgery or is uncontrolled by your current pain medications - Increasing or concerning amount of bleeding from your nose. You should expect some bloody drainage from your nose for 1-3 days. Even a 5-10 minute trickle nose bleed is expected after surgery - Nausea and vomiting that does not go away - Chest pain/shortness of breath - Any other acute events, problems, or concerns  2. Wound Care/Dressings/Drain Instructions:  - It is OK to shower following your surgery. - You have a small amount of dissolvable packing in your nose. The remainder of this may be removed in clinic, otherwise this does not need to come out. -There are small soft plastic splints in your nose to hold your septum in the middle. These may crust a bit, which can be reduced by using nasal saline spray.    3. Follow Up:  - A follow up appointment will be scheduled for you with Dr. Greggory. If you do not know the date/time, please contact our office  4. Activity/Restrictions: - Resume your regular activities, as tolerated - Avoid heavy lifting, bending over, manipulating your nose, blowing your nose, sneezing with your mouth closed, straining and strenuous activities until instructed otherwise. Do not lift more than 10 lbs for 10 days  5. Diet: - Resume your regular diet, as tolerated  6. Additional Instructions: - You should complete the course of antibiotic/steroid prescribed to you before surgery - Use acetaminophen /Tylenol  and ibuprofen  to control your pain. If this does not bring you relief or the pain remains severe, a stronger pain medication (Tramadol  - 50mg  tablet every 6 hours as needed) has been prescribed to you. - DO NOT MIX NARCOTIC PAIN MEDICATIONS OR TAKE NARCOTIC PRESCRIPTIONS AT THE SAME TIME (PERCODET, LORTAB, ROXICODONE ,  ETC.) - DO NOT DRIVE OR OPERATE HEAVY MACHINERY WHILE ON NARCOTICS - DO NOT TAKE MORE THAN 4 GRAMS (4000mg ) OF TYLENOL  (ACETAMINOPHEN ) IN 24 HOURS  ________________________________________________________________  Department of Otolaryngology Contact Info: Otolaryngology Nursing Triage (Monday-Friday daytime working hours or for emergencies after hours) [phone]     Post Anesthesia Home Care Instructions  Activity: Get plenty of rest for the remainder of the day. A responsible individual must stay with you for 24 hours following the procedure.  For the next 24 hours, DO NOT: -Drive a car -Advertising copywriter -Drink alcoholic beverages -Take any medication unless instructed by your physician -Make any legal decisions or sign important papers.  Meals: Start with liquid foods such as gelatin or soup. Progress to regular foods as tolerated. Avoid greasy, spicy, heavy foods. If nausea and/or vomiting occur, drink only clear liquids until the nausea and/or vomiting subsides. Call your physician if vomiting continues.  Special Instructions/Symptoms: Your throat may feel dry or sore from the anesthesia or the breathing tube placed in your throat during surgery. If this causes discomfort, gargle with warm salt water. The discomfort should disappear within 24 hours.  If you had a scopolamine patch placed behind your ear for the management of post- operative nausea and/or vomiting:  1. The medication in the patch is effective for 72 hours, after which it should be removed.  Wrap patch in a tissue and discard in the trash. Wash hands thoroughly with soap and water. 2. You may remove the patch earlier than 72 hours if you experience unpleasant side effects  which may include dry mouth, dizziness or visual disturbances. 3. Avoid touching the patch. Wash your hands with soap and water after contact with the patch.

## 2024-07-17 NOTE — Transfer of Care (Signed)
 Immediate Anesthesia Transfer of Care Note  Patient: Chelsea Coleman  Procedure(s) Performed: ENDOSCOPIC SEPTOPLASTY WITH NASAL INFERIOR TURBINATE REDUCTION (Bilateral: Nose)  Patient Location: PACU  Anesthesia Type:General  Level of Consciousness: awake, alert , and oriented  Airway & Oxygen Therapy: Patient Spontanous Breathing and Patient connected to face mask oxygen  Post-op Assessment: Report given to RN and Post -op Vital signs reviewed and stable  Post vital signs: Reviewed and stable  Last Vitals:  Vitals Value Taken Time  BP    Temp    Pulse 106 07/17/24 11:46  Resp 14 07/17/24 11:46  SpO2 100 % 07/17/24 11:46  Vitals shown include unfiled device data.  Last Pain:  Vitals:   07/17/24 0752  TempSrc: Oral      Patients Stated Pain Goal: 2 (07/17/24 0752)  Complications: No notable events documented.

## 2024-07-17 NOTE — Op Note (Addendum)
 Otolaryngology Operative note  Chelsea Coleman Date/Time of Admission: 07/17/2024  7:09 AM  CSN: [id number];FMW:[id number]  DOB: [birth year] Age: 69 y.o. Location: [facility]    Pre-Op Diagnosis: Nasal obstruction Hypertrophy of inferior nasal turbinate bilaterally Nasal septal deviation  Post-Op Diagnosis: same  Procedure: Endoscopic septoplasty - 69479 Bilateral inferior turbinate reduction - 30140-50-51  Surgeon: Hadassah Parody, MD  Anesthesia type:  General  Anesthesiologist: Anesthesiologist: Jerrye Sharper, MD CRNA: Burnard Rosaline HERO, CRNA; Debarah Chiquita LABOR, CRNA   Staff: Circulator: Elaine Avelina PARAS, RN Relief Circulator: Eliberto Geroge HERO, RN Scrub Person: Alto Charmaine PARAS  Implants: * No implants in log *  Specimens: none  EBL: 50 cc  Drains: none; doyle splints placed  Post-op disposition and condition: PACU, hemodynamically stable   Findings: - significant mucosal edema throughout entirety of nasal cavity on both sides - Right > left inferior turbinate hypertrophy; both submucosally reduced and out-fractured - nasal septal deviation to the left anteriorly and with bony deviation rightward more posteriorly   Complications: None apparent  Indications and consent:  Chelsea Coleman is a 69 y.o. female with diagnoses above. The patient's options were discussed, including risks/benefits/alternatives for each option. Patient expressed understanding, and despite these risks, consented and decided to proceed with above procedures. Informed consent was signed before proceeding.  Procedure: Anesthesia and Prep -- After being properly identified in the preoperative holding area, the patient was brought into the operating suite. She was placed supine on the operating table. A pre-procedural time-out was performed. After induction of general anesthesia, the patient was successfully intubated with confirmation of tube placement via CO2  return. Eyes were taped closed with steristrips. Pledgets soaked in 1:1000 fluorescein  dyed epinephrine  were placed in each nostril. The face was draped in usual fashion for sinus surgery.  Endoscopic-assisted septoplasty  -- The nose was decongested with epinephrine  1:1000 soaked pledgets. The septum was injected bilaterally with 1% lidocaine  with 1:100,000 epinephrine . A 15 blade was utilized to make a hemi-transfixion incision in the left nasal cavity. A Cottle elevator was then used to elevate a sub-mucoperichondrial flap. Once adequate room was available, the 0 degree endoscopic was placed into the flap and the septal cartilage was visualized. The freer was used to raise the flap back to the bony-cartilaginous junction. A caudal transcartilaginous cut was made using the Cottle elevator taking care to leave at least a 1cm caudal strut. The opposing mucoperichondrial flap was then elevated off the cartilage and bone of the right side. Then, a Rudean forcep was used to cut away the cartilage working anterior to posterior. As the cartilage was removed superiorly, care was taken to leave at least a 1cm dorsal strut.  A Jansen-Middleton double-action rongeur was used to remove the bony aspect of the deviated septum.  Bony maxillary crest was identified and partially removed with a Rudean forcep. The nose was then inspected and the septum was seen to be midline. The previous nasal obstruction was much improved. A #15 blade was used to make a hole in the left posterior mucoperichondrial flap to allow for blood to egress and prevent collection in blood in the septal flap. At the conclusion of the case, the hemitransfixion incision was closed with interrupted 5-0 fast absorbing gut suture and doyle splints coated in mupirocin  were placed bilaterally and sutured to the anterior septum with a 3-0 prolene suture.   Bilateral submucous inferior turbinate reduction with microdebrider and outfracture -- 1% lidocaine   with 1:100,000 units of epinephrine   was injected into the bilateral inferior turbinates. A 15 blade was used to make an incision in the head of the left inferior turbinate. A cottle elevator was then used to elevate a submucosal flap along the medial surface of the turbinate. A microdebrider turbinate blade was inserted into the left turbinate and pieces of bone and soft tissue of the turbinate were resected in a submucosal fashion. Particular attention was paid to the head of the turbinate to improve nasal valve airflow. Once adequate resection was achieved, a Boies elevator was used to out-fracture the inferior turbinate. This procedure was then repeated on the right side to complete the bilateral submucosal inferior turbinate resection. The nasal airway was then seen to be widely patent.  Conclusion -- Both sides were irrigated and clot removed. A flexible suction catheter was used to remove fluid and blood from the oropharynx and hypopharynx.   Steri strips were removed from the eyelids and the eyes were checked for tension or proptosis, none was observed. The patient's skin was cleaned. She was returned to the care of the anesthesia team. She was then weaned from the anesthetic and transported to the PACU in stable condition.

## 2024-07-18 ENCOUNTER — Encounter (HOSPITAL_BASED_OUTPATIENT_CLINIC_OR_DEPARTMENT_OTHER): Payer: Self-pay

## 2024-07-23 ENCOUNTER — Encounter (INDEPENDENT_AMBULATORY_CARE_PROVIDER_SITE_OTHER)

## 2024-08-31 ENCOUNTER — Ambulatory Visit

## 2024-09-03 ENCOUNTER — Ambulatory Visit: Admitting: Internal Medicine

## 2024-10-04 ENCOUNTER — Other Ambulatory Visit (HOSPITAL_BASED_OUTPATIENT_CLINIC_OR_DEPARTMENT_OTHER)

## 2024-10-30 ENCOUNTER — Ambulatory Visit: Payer: Self-pay | Admitting: Internal Medicine

## 2024-11-06 ENCOUNTER — Ambulatory Visit: Admitting: Physician Assistant
